# Patient Record
Sex: Male | Born: 1940 | Race: White | Hispanic: No | Marital: Married | Smoking: Never smoker
Health system: Southern US, Community
[De-identification: ages and names within clinical notes are randomized; demographics above are authoritative.]

## PROBLEM LIST (undated history)

## (undated) DIAGNOSIS — Z9889 Other specified postprocedural states: Secondary | ICD-10-CM

## (undated) DIAGNOSIS — J986 Disorders of diaphragm: Secondary | ICD-10-CM

## (undated) DIAGNOSIS — S129XXA Fracture of neck, unspecified, initial encounter: Secondary | ICD-10-CM

## (undated) DIAGNOSIS — R112 Nausea with vomiting, unspecified: Secondary | ICD-10-CM

## (undated) DIAGNOSIS — J449 Chronic obstructive pulmonary disease, unspecified: Secondary | ICD-10-CM

## (undated) DIAGNOSIS — I519 Heart disease, unspecified: Secondary | ICD-10-CM

## (undated) DIAGNOSIS — R413 Other amnesia: Secondary | ICD-10-CM

## (undated) DIAGNOSIS — D649 Anemia, unspecified: Secondary | ICD-10-CM

## (undated) DIAGNOSIS — M4802 Spinal stenosis, cervical region: Secondary | ICD-10-CM

## (undated) DIAGNOSIS — M1990 Unspecified osteoarthritis, unspecified site: Secondary | ICD-10-CM

## (undated) DIAGNOSIS — G4700 Insomnia, unspecified: Secondary | ICD-10-CM

## (undated) DIAGNOSIS — K5732 Diverticulitis of large intestine without perforation or abscess without bleeding: Secondary | ICD-10-CM

## (undated) DIAGNOSIS — I10 Essential (primary) hypertension: Secondary | ICD-10-CM

## (undated) DIAGNOSIS — Q791 Other congenital malformations of diaphragm: Secondary | ICD-10-CM

## (undated) DIAGNOSIS — G4730 Sleep apnea, unspecified: Secondary | ICD-10-CM

## (undated) DIAGNOSIS — E291 Testicular hypofunction: Secondary | ICD-10-CM

## (undated) DIAGNOSIS — E039 Hypothyroidism, unspecified: Secondary | ICD-10-CM

## (undated) DIAGNOSIS — Q249 Congenital malformation of heart, unspecified: Secondary | ICD-10-CM

## (undated) DIAGNOSIS — I5189 Other ill-defined heart diseases: Secondary | ICD-10-CM

## (undated) DIAGNOSIS — T884XXA Failed or difficult intubation, initial encounter: Secondary | ICD-10-CM

## (undated) DIAGNOSIS — N429 Disorder of prostate, unspecified: Secondary | ICD-10-CM

## (undated) HISTORY — DX: Diverticulitis of large intestine without perforation or abscess without bleeding: K57.32

## (undated) HISTORY — PX: TONSILLECTOMY: SUR1361

## (undated) HISTORY — DX: Insomnia, unspecified: G47.00

## (undated) HISTORY — PX: CARPAL TUNNEL RELEASE: SHX101

## (undated) HISTORY — DX: Fracture of neck, unspecified, initial encounter: S12.9XXA

## (undated) HISTORY — PX: OTHER SURGICAL HISTORY: SHX169

## (undated) HISTORY — DX: Testicular hypofunction: E29.1

---
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
## 1999-02-20 ENCOUNTER — Encounter: Admission: RE | Admit: 1999-02-20 | Discharge: 1999-02-20 | Payer: Self-pay | Admitting: Emergency Medicine

## 1999-02-20 ENCOUNTER — Encounter: Payer: Self-pay | Admitting: Emergency Medicine

## 2001-09-22 ENCOUNTER — Ambulatory Visit (HOSPITAL_COMMUNITY): Admission: RE | Admit: 2001-09-22 | Discharge: 2001-09-22 | Payer: Self-pay | Admitting: Orthopedic Surgery

## 2001-09-22 ENCOUNTER — Encounter: Payer: Self-pay | Admitting: Orthopedic Surgery

## 2003-04-23 ENCOUNTER — Ambulatory Visit (HOSPITAL_COMMUNITY): Admission: RE | Admit: 2003-04-23 | Discharge: 2003-04-23 | Payer: Self-pay | Admitting: Gastroenterology

## 2003-04-23 ENCOUNTER — Encounter (INDEPENDENT_AMBULATORY_CARE_PROVIDER_SITE_OTHER): Payer: Self-pay

## 2006-06-01 ENCOUNTER — Ambulatory Visit: Payer: Self-pay | Admitting: Specialist

## 2009-02-04 ENCOUNTER — Encounter: Admission: RE | Admit: 2009-02-04 | Discharge: 2009-02-04 | Payer: Self-pay | Admitting: Internal Medicine

## 2009-04-14 ENCOUNTER — Encounter: Admission: RE | Admit: 2009-04-14 | Discharge: 2009-04-14 | Payer: Self-pay | Admitting: Neurological Surgery

## 2009-08-06 ENCOUNTER — Ambulatory Visit: Payer: Self-pay | Admitting: Specialist

## 2010-04-13 ENCOUNTER — Emergency Department (HOSPITAL_COMMUNITY)
Admission: EM | Admit: 2010-04-13 | Discharge: 2010-04-13 | Payer: Self-pay | Source: Home / Self Care | Admitting: Emergency Medicine

## 2010-04-28 ENCOUNTER — Encounter
Admission: RE | Admit: 2010-04-28 | Discharge: 2010-04-28 | Payer: Self-pay | Source: Home / Self Care | Attending: Internal Medicine | Admitting: Internal Medicine

## 2010-09-11 NOTE — Op Note (Signed)
NAME:  Jason Friedman, Jason Friedman                      ACCOUNT NO.:  [id number]   MEDICAL RECORD NO.:  [id number]                   PATIENT TYPE:  AMB   LOCATION:  ENDO                                 FACILITY:  [facility]   PHYSICIAN:  James L. Malon Kindle., M.D.          DATE OF BIRTH:  29-Mar-1941   DATE OF PROCEDURE:  04/23/2003  DATE OF DISCHARGE:                                 OPERATIVE REPORT   PROCEDURE:  Colonoscopy and polypectomy.   MEDICATIONS:  Fentanyl 75 mcg, Versed 7 mg IV.   SCOPE:  Olympus pediatric adjustable colonoscope.   DESCRIPTION OF PROCEDURE:  The procedure had been explained to the patient  and consent obtained. With the patient in the left lateral decubitus  position, the Olympus pediatric adjustable scope was inserted and advanced.  The prep was excellent. We were able to reach the cecum without difficulty.  The ileocecal valve and appendiceal orifice were seen. The scope was  withdrawn and the cecum, ascending colon, and descending colon were seen  well. At 60 cm from the anal verge, a 3/4 cm sessile polyp was then removed  with the snare and sucked through the scope. No other polyps were seen until  the descending colon was reached. At that point, there was marked  diverticular disease but no polyps were seen. The scope withdrawn, the  patient tolerated the procedure well.   ASSESSMENT:  1. Descending colon polyp removed, 211.3.  2. Diverticulosis, 562.10.   PLAN:  Routine post polypectomy instructions, will recommend repeating in  three years.                                               James L. Malon Kindle., M.D.    Waldron Session  D:  04/23/2003  T:  04/23/2003  Job:  [id number]   cc:   Reinaldo Raddle. Lance Bosch, M.D.  [facility]  [address]  [place]  [place] [address]  Fax: [phone]

## 2011-03-02 HISTORY — PX: OTHER SURGICAL HISTORY: SHX169

## 2011-04-28 ENCOUNTER — Ambulatory Visit: Payer: Self-pay | Admitting: Specialist

## 2011-04-28 DIAGNOSIS — J986 Disorders of diaphragm: Secondary | ICD-10-CM | POA: Diagnosis not present

## 2011-04-28 DIAGNOSIS — Q791 Other congenital malformations of diaphragm: Secondary | ICD-10-CM | POA: Diagnosis not present

## 2011-05-14 DIAGNOSIS — M169 Osteoarthritis of hip, unspecified: Secondary | ICD-10-CM | POA: Diagnosis not present

## 2011-06-14 DIAGNOSIS — K5730 Diverticulosis of large intestine without perforation or abscess without bleeding: Secondary | ICD-10-CM | POA: Diagnosis not present

## 2011-06-14 DIAGNOSIS — Z800 Family history of malignant neoplasm of digestive organs: Secondary | ICD-10-CM | POA: Diagnosis not present

## 2011-06-14 DIAGNOSIS — Z09 Encounter for follow-up examination after completed treatment for conditions other than malignant neoplasm: Secondary | ICD-10-CM | POA: Diagnosis not present

## 2011-06-14 DIAGNOSIS — Z86010 Personal history of colonic polyps: Secondary | ICD-10-CM | POA: Diagnosis not present

## 2011-07-27 DIAGNOSIS — H2510 Age-related nuclear cataract, unspecified eye: Secondary | ICD-10-CM | POA: Diagnosis not present

## 2011-07-27 DIAGNOSIS — H1045 Other chronic allergic conjunctivitis: Secondary | ICD-10-CM | POA: Diagnosis not present

## 2011-07-27 DIAGNOSIS — H04129 Dry eye syndrome of unspecified lacrimal gland: Secondary | ICD-10-CM | POA: Diagnosis not present

## 2011-08-06 DIAGNOSIS — M169 Osteoarthritis of hip, unspecified: Secondary | ICD-10-CM | POA: Diagnosis not present

## 2011-08-09 ENCOUNTER — Encounter (HOSPITAL_COMMUNITY): Payer: Self-pay | Admitting: Pharmacy Technician

## 2011-08-12 ENCOUNTER — Ambulatory Visit (HOSPITAL_COMMUNITY)
Admission: RE | Admit: 2011-08-12 | Discharge: 2011-08-12 | Disposition: A | Discharge status: Home / Self Care | Payer: BC Managed Care – PPO | Source: Ambulatory Visit | Attending: Orthopedic Surgery | Admitting: Orthopedic Surgery

## 2011-08-12 ENCOUNTER — Encounter (HOSPITAL_COMMUNITY)
Admission: RE | Admit: 2011-08-12 | Discharge: 2011-08-12 | Disposition: A | Discharge status: Home / Self Care | Payer: BC Managed Care – PPO | Source: Ambulatory Visit | Attending: Orthopedic Surgery | Admitting: Orthopedic Surgery

## 2011-08-12 ENCOUNTER — Encounter (HOSPITAL_COMMUNITY): Payer: Self-pay

## 2011-08-12 DIAGNOSIS — M169 Osteoarthritis of hip, unspecified: Secondary | ICD-10-CM | POA: Insufficient documentation

## 2011-08-12 DIAGNOSIS — Z01810 Encounter for preprocedural cardiovascular examination: Secondary | ICD-10-CM | POA: Insufficient documentation

## 2011-08-12 DIAGNOSIS — M25859 Other specified joint disorders, unspecified hip: Secondary | ICD-10-CM | POA: Diagnosis not present

## 2011-08-12 DIAGNOSIS — Z01818 Encounter for other preprocedural examination: Secondary | ICD-10-CM | POA: Insufficient documentation

## 2011-08-12 DIAGNOSIS — Z01812 Encounter for preprocedural laboratory examination: Secondary | ICD-10-CM | POA: Insufficient documentation

## 2011-08-12 DIAGNOSIS — M1610 Unilateral primary osteoarthritis, unspecified hip: Secondary | ICD-10-CM | POA: Insufficient documentation

## 2011-08-12 DIAGNOSIS — M259 Joint disorder, unspecified: Secondary | ICD-10-CM | POA: Diagnosis not present

## 2011-08-12 HISTORY — DX: Congenital malformation of heart, unspecified: Q24.9

## 2011-08-12 HISTORY — DX: Unspecified osteoarthritis, unspecified site: M19.90

## 2011-08-12 HISTORY — DX: Essential (primary) hypertension: I10

## 2011-08-12 HISTORY — DX: Disorder of prostate, unspecified: N42.9

## 2011-08-12 HISTORY — DX: Chronic obstructive pulmonary disease, unspecified: J44.9

## 2011-08-12 HISTORY — DX: Other congenital malformations of diaphragm: Q79.1

## 2011-08-12 HISTORY — DX: Sleep apnea, unspecified: G47.30

## 2011-08-12 HISTORY — DX: Hypothyroidism, unspecified: E03.9

## 2011-08-12 HISTORY — DX: Other specified postprocedural states: Z98.890

## 2011-08-12 HISTORY — DX: Other amnesia: R41.3

## 2011-08-12 HISTORY — DX: Other specified postprocedural states: R11.2

## 2011-08-12 HISTORY — DX: Fracture of neck, unspecified, initial encounter: S12.9XXA

## 2011-08-12 LAB — COMPREHENSIVE METABOLIC PANEL
ALT: 14 U/L (ref 0–53)
AST: 19 U/L (ref 0–37)
Alkaline Phosphatase: 54 U/L (ref 39–117)
CO2: 29 mEq/L (ref 19–32)
Calcium: 9.5 mg/dL (ref 8.4–10.5)
Chloride: 102 mEq/L (ref 96–112)
GFR calc non Af Amer: 83 mL/min — ABNORMAL LOW (ref >90)
Glucose, Bld: 114 mg/dL — ABNORMAL HIGH (ref 70–99)
Potassium: 4.4 mEq/L (ref 3.5–5.1)
Sodium: 137 mEq/L (ref 135–145)
Total Bilirubin: 0.6 mg/dL (ref 0.3–1.2)

## 2011-08-12 LAB — CBC
HCT: 50.2 % (ref 39.0–52.0)
MCH: 33.3 pg (ref 26.0–34.0)
MCV: 98.4 fL (ref 78.0–100.0)
RBC: 5.1 MIL/uL (ref 4.22–5.81)
WBC: 8.3 10*3/uL (ref 4.0–10.5)

## 2011-08-12 LAB — URINALYSIS, ROUTINE W REFLEX MICROSCOPIC
Bilirubin Urine: NEGATIVE
Hgb urine dipstick: NEGATIVE
Ketones, ur: NEGATIVE mg/dL
Specific Gravity, Urine: 1.023 (ref 1.005–1.030)
Urobilinogen, UA: 0.2 mg/dL (ref 0.0–1.0)

## 2011-08-12 LAB — SURGICAL PCR SCREEN
MRSA, PCR: NEGATIVE
Staphylococcus aureus: NEGATIVE

## 2011-08-12 LAB — APTT: aPTT: 33 seconds (ref 24–37)

## 2011-08-12 MED ORDER — CHLORHEXIDINE GLUCONATE 4 % EX LIQD: 60.0000 mL | Freq: Once | CUTANEOUS | Status: DC

## 2011-08-12 NOTE — Pre-Procedure Instructions (Signed)
Spoke with dr Acey Lav, made aware pt medical hx, anesthesia will see pt day of surgery

## 2011-08-12 NOTE — Pre-Procedure Instructions (Addendum)
Last office visit note dr Meredeth Ide pulmonary Gavin Potters clicnic 04-16-2011 on chart Medical clearance note dr gates on chart1-05-29-11 chest fluro results on chart

## 2011-08-12 NOTE — Patient Instructions (Signed)
20 Jason Friedman  08/12/2011   Your procedure is scheduled on:  Monday 08-23-2011  Report to [facility] at  100 PM.  Call this number if you have problems the morning of surgery: [phone]   Remember:   Do not eat food  After Midnight. Clear liquids midnight until 0930am, then nothing by mouth  .  Take these medicines the morning of surgery with A SIP OF WATER: synthroid, liothronine, hydrocodone if needed   Do not wear jewelry or make up.  Do not wear lotions, powders, or perfumes.Do not wear deodorant.    Do not bring valuables to the hospital.  Contacts, dentures or bridgework may not be worn into surgery.  Leave suitcase in the car. After surgery it may be brought to your room.  For patients admitted to the hospital, checkout time is 11:00 AM the day of discharge.     Special Instructions: CHG Shower Use Special Wash: 1/2 bottle night before surgery and 1/2 bottle morning of surgery.neck down avoid private area   Please read over the following fact sheets that you were given: MRSA Information, blood fact sheet, incentive spirometer fact sheet   Cain Sieve [facility] pre op nurse phone number [phone], call if needed

## 2011-08-12 NOTE — Pre-Procedure Instructions (Signed)
ekg 09-02-2008 dr gates on chart due to hx of abnormal ekg

## 2011-08-22 ENCOUNTER — Other Ambulatory Visit: Payer: Self-pay | Admitting: Orthopedic Surgery

## 2011-08-22 NOTE — H&P (Signed)
Jason Friedman  DOB: [birth year] Married / Language: English / Race: White / Male  Date of Admission:  08/23/2011  Chief Complaint:  Right Hip Pain  History of Present Illness The patient is a 71 year old male who comes in for a preoperative History and Physical. The patient is scheduled for a right total hip arthroplasty to be performed by Jason Friedman. Aluisio, MD at [facility] on 08/23/2011. The patient is a 71 year old male who presents for a recheck of Hip problem. The patient was seen for a second opinion.The patient reports right hip problems including pain symptoms that have been present for 2 year(s). The symptoms began without any known injury.The symptoms are described as moderate in severity.The patient feels as if their symptoms are does feel they are worsening. Current treatment includes opioid analgesics (Hydrocodone prn). Prior to being seen today the patient was previously evaluated by a colleague 2 year(s) ago. Previous workup for this problem has included pelvic x-rays and hip x-rays. This problem has not been previously treated. Jason Friedman states that the hip does hurt him with most activities. He remains very active and said he works through the pain. he has lost a lot of motion on the hip. He saw Jason Friedman two years ago and was told he had severe arthritis and needed a hip replacement. He is here today because of pain and dysfunction have worsened. He states he would like to be able to do things that he desires and currently cannot do. Pain is in his groin radiating down his thigh to his knee. He says that his movement is more limited now than it has been in the past. His left hip does not hurt. He is not having back pain, lower extremity weakness, or paresthesias. They have been treated conservatively in the past for the above stated problem and despite conservative measures, they continue to have progressive pain and severe functional limitations and  dysfunction. They have failed non-operative management. It is felt that they would benefit from undergoing total joint replacement. Risks and benefits of the procedure have been discussed with the patient and they elect to proceed with surgery. There are no active contraindications to surgery such as ongoing infection or rapidly progressive neurological disease.  Allergies Sulfa Drugs Shellfish Gabapentin *ANTICONVULSANTS*. Visual Disturbances, Hair Loss  Medication History Ambien CR (12.5MG  Tablet ER, Oral) Active. Synthroid ( Tablet, Oral) Active. Eye Allergy Relief (0.025-0.3% Solution, Ophthalmic) Active. Liothyronine Sodium ( Tablet, Oral) Active. Vicodin (5-500MG  Tablet, Oral) Active. Valium (10MG  Tablet, Oral) Active. Aspirin Childrens (81MG  Tablet Chewable, Oral) Active. AndroGel Pump (1% Gel, Transdermal) Active.  Problem List/Past Medical Osteoarthritis, Hip (715.35) Prostatitis (601.9) Sigmoid diverticulosis (562.10) Renal calculus (592.0) Hypertension Hypothyroidism Hypogonadism Insomnia Allergic Rhinitis Sleep Apnea Colonic Polyps Hyperlipidemia Impaired Memory Right-sided partial paralyzed diaphram  Past Surgical History Tonsillectomy Arthroscopy of Shoulder. right Colon Polyp Removal - Colonoscopy  Family History Cancer. mother and father Hypertension. sister and brother  Social History Number of flights of stairs before winded. greater than 5 Pain Contract. no Marital status. married Illicit drug use. no Living situation. live with spouse Tobacco use. never smoker Exercise. Exercises daily; does running / walking and gym / weights Drug/Alcohol Rehab (Currently). no Drug/Alcohol Rehab (Previously). no Current work status. retired Alcohol use. current drinker; drinks beer; 8-14 per week Children. 4  Review of Systems General:Present- Memory Loss (slight , rare). Not Present- Chills, Fever, Night Sweats, Fatigue,  Weight Gain and Weight Loss. Skin:Not  Present- Hives, Itching, Rash, Eczema and Lesions. HEENT:Not Present- Tinnitus, Headache, Double Vision, Visual Loss, Hearing Loss and Dentures. Respiratory:Not Present- Shortness of breath with exertion, Shortness of breath at rest, Allergies, Coughing up blood and Chronic Cough. Cardiovascular:Not Present- Chest Pain, Racing/skipping heartbeats, Difficulty Breathing Lying Down, Murmur, Swelling and Palpitations. Gastrointestinal:Not Present- Bloody Stool, Heartburn, Abdominal Pain, Vomiting, Nausea, Constipation, Diarrhea, Difficulty Swallowing, Jaundice and Loss of appetitie. Male Genitourinary:Not Present- Urinary frequency, Blood in Urine, Weak urinary stream, Discharge, Flank Pain, Incontinence, Painful Urination, Urgency, Urinary Retention and Urinating at Night. Musculoskeletal:Present- Muscle Weakness, Muscle Pain, Joint Swelling, Joint Pain, Back Pain, Morning Stiffness and Spasms. Neurological:Not Present- Tremor, Dizziness, Blackout spells, Paralysis, Difficulty with balance and Weakness. Psychiatric:Not Present- Insomnia.  Vitals Weight: 69 lb Height: 186 in Body Surface Area: 2.03 m Body Mass Index: 1.4 kg/m Pulse: 68 (Regular) Resp.: 14 (Unlabored) BP: 138/82 (Sitting, Right Arm, Standard)  Physical Exam The physical exam findings are as follows:   General Mental Status - Alert, cooperative and good historian. General Appearance- pleasant. Not in acute distress. Orientation- Oriented X3. Build & Nutrition- Well nourished and Well developed.   Head and Neck Head- normocephalic, atraumatic . Neck Global Assessment- supple. no bruit auscultated on the right and no bruit auscultated on the left.   Eye Pupil- Bilateral- Regular and Round. Motion- Bilateral- EOMI.   Chest and Lung Exam Auscultation: Breath sounds:- clear at anterior chest wall and - clear at posterior chest  wall. Adventitious sounds:- No Adventitious sounds.   Cardiovascular Auscultation:Rhythm- Regular rate and rhythm. Heart Sounds- S1 WNL and S2 WNL. Murmurs & Other Heart Sounds:Auscultation of the heart reveals - No Murmurs.   Abdomen Palpation/Percussion:Tenderness- Abdomen is non-tender to palpation. Rigidity (guarding)- Abdomen is soft. Auscultation:Auscultation of the abdomen reveals - Bowel sounds normal.   Male Genitourinary  Not done, not pertinent to present illness  Musculoskeletal On exam a well developed male, alert, and oriented in no apparent distress. He walks with an antalgic gait pattern. His left hip has normal range of motion with no discomfort. His right hip can be flexed to 95, no internal rotation, about 10 external rotation, 20 abduction. His knee exam is normal bilaterally. Pulses sensation and motor intact both lower extremities. Gait pattern is significantly antalgic on the right.  RADIOGRAPHS: AP pelvis and lateral of the right hip shows he has severe endstage arthritis to the right hip, bone on bone with large osteophyte formation and subchondral sclerosis.  Assessment & Plan Osteoarthritis Right Hip  Plan is for a Right Total Hip Arthroplasty by Dr. Lequita Halt.  Plan is to go home after the hospital stay.  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 He had a chest fluoro performed at [facility] back on Apr 28, 2011 which showed findings concerning for at least partially paralyzed right hemidiaphragm.  PCP - Dr. Kevan Ny - Patient has been seen preoperatively by Dr. Kevan Ny and felt to stable for upcoming surgery.  Avel Peace, PA-C

## 2011-08-23 ENCOUNTER — Inpatient Hospital Stay (HOSPITAL_COMMUNITY): Payer: BC Managed Care – PPO

## 2011-08-23 ENCOUNTER — Ambulatory Visit (HOSPITAL_COMMUNITY): Payer: BC Managed Care – PPO | Admitting: Anesthesiology

## 2011-08-23 ENCOUNTER — Encounter (HOSPITAL_COMMUNITY): Payer: Self-pay | Admitting: Anesthesiology

## 2011-08-23 ENCOUNTER — Encounter (HOSPITAL_COMMUNITY)
Admission: RE | Disposition: A | Discharge status: Home Health | Payer: Self-pay | Source: Ambulatory Visit | Attending: Orthopedic Surgery

## 2011-08-23 ENCOUNTER — Encounter (HOSPITAL_COMMUNITY): Payer: Self-pay | Admitting: *Deleted

## 2011-08-23 ENCOUNTER — Inpatient Hospital Stay (HOSPITAL_COMMUNITY)
Admission: RE | Admit: 2011-08-23 | Discharge: 2011-08-25 | DRG: 818 | Disposition: A | Discharge status: Home Health | Payer: BC Managed Care – PPO | Source: Ambulatory Visit | Attending: Orthopedic Surgery | Admitting: Orthopedic Surgery

## 2011-08-23 DIAGNOSIS — I10 Essential (primary) hypertension: Secondary | ICD-10-CM | POA: Diagnosis present

## 2011-08-23 DIAGNOSIS — M1610 Unilateral primary osteoarthritis, unspecified hip: Principal | ICD-10-CM | POA: Diagnosis present

## 2011-08-23 DIAGNOSIS — M169 Osteoarthritis of hip, unspecified: Secondary | ICD-10-CM | POA: Diagnosis present

## 2011-08-23 DIAGNOSIS — M25559 Pain in unspecified hip: Secondary | ICD-10-CM | POA: Diagnosis not present

## 2011-08-23 DIAGNOSIS — E039 Hypothyroidism, unspecified: Secondary | ICD-10-CM | POA: Diagnosis present

## 2011-08-23 DIAGNOSIS — J4489 Other specified chronic obstructive pulmonary disease: Secondary | ICD-10-CM | POA: Diagnosis present

## 2011-08-23 DIAGNOSIS — Z96649 Presence of unspecified artificial hip joint: Secondary | ICD-10-CM

## 2011-08-23 DIAGNOSIS — J449 Chronic obstructive pulmonary disease, unspecified: Secondary | ICD-10-CM | POA: Diagnosis present

## 2011-08-23 HISTORY — PX: TOTAL HIP ARTHROPLASTY: SHX124

## 2011-08-23 HISTORY — DX: Anemia, unspecified: D64.9

## 2011-08-23 LAB — TYPE AND SCREEN: Antibody Screen: NEGATIVE

## 2011-08-23 SURGERY — ARTHROPLASTY, HIP, TOTAL,POSTERIOR APPROACH
Anesthesia: General | Site: Hip | Laterality: Right | Wound class: Clean

## 2011-08-23 MED ORDER — LIDOCAINE HCL (CARDIAC) 20 MG/ML IV SOLN
INTRAVENOUS | Status: DC | PRN
  Administered 2011-08-23: 50 mg via INTRAVENOUS

## 2011-08-23 MED ORDER — METHOCARBAMOL 100 MG/ML IJ SOLN
500.0000 mg | Freq: Four times a day (QID) | INTRAVENOUS | Status: DC | PRN
  Administered 2011-08-23: 500 mg via INTRAVENOUS
  Filled 2011-08-23: qty 5

## 2011-08-23 MED ORDER — BISACODYL 10 MG RE SUPP: 10.0000 mg | Freq: Every day | RECTAL | Status: DC | PRN

## 2011-08-23 MED ORDER — NEOSTIGMINE METHYLSULFATE 1 MG/ML IJ SOLN
INTRAMUSCULAR | Status: DC | PRN
  Administered 2011-08-23: 3.5 mg via INTRAVENOUS

## 2011-08-23 MED ORDER — FENTANYL CITRATE 0.05 MG/ML IJ SOLN
INTRAMUSCULAR | Status: DC | PRN
  Administered 2011-08-23 (×2): 50 ug via INTRAVENOUS
  Administered 2011-08-23: 150 ug via INTRAVENOUS
  Administered 2011-08-23: 100 ug via INTRAVENOUS

## 2011-08-23 MED ORDER — CEFAZOLIN SODIUM-DEXTROSE 2-3 GM-% IV SOLR: 2.0000 g | INTRAVENOUS | Status: DC

## 2011-08-23 MED ORDER — OXYCODONE HCL 5 MG PO TABS
5.0000 mg | ORAL_TABLET | ORAL | Status: DC | PRN
  Administered 2011-08-23 – 2011-08-25 (×10): 10 mg via ORAL
  Filled 2011-08-23 (×10): qty 2

## 2011-08-23 MED ORDER — BUPIVACAINE LIPOSOME 1.3 % IJ SUSP
INTRAMUSCULAR | Status: DC | PRN
  Administered 2011-08-23: 20 mL

## 2011-08-23 MED ORDER — BUPIVACAINE LIPOSOME 1.3 % IJ SUSP
20.0000 mL | Freq: Once | INTRAMUSCULAR | Status: DC
  Filled 2011-08-23: qty 20

## 2011-08-23 MED ORDER — DROPERIDOL 2.5 MG/ML IJ SOLN
INTRAMUSCULAR | Status: DC | PRN
  Administered 2011-08-23: 0.625 mg via INTRAVENOUS

## 2011-08-23 MED ORDER — SODIUM CHLORIDE 0.9 % IJ SOLN
INTRAMUSCULAR | Status: DC | PRN
  Administered 2011-08-23: 50 mL

## 2011-08-23 MED ORDER — METOCLOPRAMIDE HCL 5 MG/ML IJ SOLN: 5.0000 mg | Freq: Three times a day (TID) | INTRAMUSCULAR | Status: DC | PRN

## 2011-08-23 MED ORDER — ACETAMINOPHEN 325 MG PO TABS: 650.0000 mg | ORAL_TABLET | Freq: Four times a day (QID) | ORAL | Status: DC | PRN

## 2011-08-23 MED ORDER — LEVOTHYROXINE SODIUM 125 MCG PO TABS
125.0000 ug | ORAL_TABLET | Freq: Every day | ORAL | Status: DC
  Administered 2011-08-24 – 2011-08-25 (×2): 125 ug via ORAL
  Filled 2011-08-23 (×3): qty 1

## 2011-08-23 MED ORDER — LACTATED RINGERS IV SOLN
INTRAVENOUS | Status: DC
  Administered 2011-08-23: 16:00:00 via INTRAVENOUS
  Administered 2011-08-23: 1000 mL via INTRAVENOUS
  Administered 2011-08-23: 16:00:00 via INTRAVENOUS

## 2011-08-23 MED ORDER — METOCLOPRAMIDE HCL 10 MG PO TABS: 5.0000 mg | ORAL_TABLET | Freq: Three times a day (TID) | ORAL | Status: DC | PRN

## 2011-08-23 MED ORDER — TRAMADOL HCL 50 MG PO TABS: 50.0000 mg | ORAL_TABLET | Freq: Four times a day (QID) | ORAL | Status: DC | PRN

## 2011-08-23 MED ORDER — MIDAZOLAM HCL 5 MG/5ML IJ SOLN
INTRAMUSCULAR | Status: DC | PRN
  Administered 2011-08-23: 2 mg via INTRAVENOUS

## 2011-08-23 MED ORDER — METHOCARBAMOL 500 MG PO TABS
500.0000 mg | ORAL_TABLET | Freq: Four times a day (QID) | ORAL | Status: DC | PRN
Start: 2011-08-23 — End: 2011-08-25
  Administered 2011-08-24 – 2011-08-25 (×3): 500 mg via ORAL
  Filled 2011-08-23 (×3): qty 1

## 2011-08-23 MED ORDER — ACETAMINOPHEN 10 MG/ML IV SOLN
1000.0000 mg | Freq: Four times a day (QID) | INTRAVENOUS | Status: AC
  Administered 2011-08-23 – 2011-08-24 (×4): 1000 mg via INTRAVENOUS
  Filled 2011-08-23 (×8): qty 100

## 2011-08-23 MED ORDER — HYDROMORPHONE HCL PF 1 MG/ML IJ SOLN
0.2500 mg | INTRAMUSCULAR | Status: DC | PRN
  Administered 2011-08-23 (×2): 0.25 mg via INTRAVENOUS
  Administered 2011-08-23: 0.5 mg via INTRAVENOUS

## 2011-08-23 MED ORDER — KETOROLAC TROMETHAMINE 30 MG/ML IJ SOLN
15.0000 mg | Freq: Once | INTRAMUSCULAR | Status: AC | PRN
  Administered 2011-08-23: 30 mg via INTRAVENOUS

## 2011-08-23 MED ORDER — VANCOMYCIN HCL 1000 MG IV SOLR
1000.0000 mg | INTRAVENOUS | Status: DC | PRN
  Administered 2011-08-23: 1000 mg via INTRAVENOUS

## 2011-08-23 MED ORDER — FLEET ENEMA 7-19 GM/118ML RE ENEM: 1.0000 | ENEMA | Freq: Once | RECTAL | Status: AC | PRN

## 2011-08-23 MED ORDER — SODIUM CHLORIDE 0.9 % IV SOLN
INTRAVENOUS | Status: DC
  Administered 2011-08-23: 100 mL/h via INTRAVENOUS
  Administered 2011-08-24: 07:00:00 via INTRAVENOUS
  Administered 2011-08-24: 20 mL/h via INTRAVENOUS

## 2011-08-23 MED ORDER — 0.9 % SODIUM CHLORIDE (POUR BTL) OPTIME
TOPICAL | Status: DC | PRN
  Administered 2011-08-23: 1000 mL

## 2011-08-23 MED ORDER — VANCOMYCIN HCL IN DEXTROSE 1-5 GM/200ML-% IV SOLN
INTRAVENOUS | Status: AC
  Filled 2011-08-23: qty 200

## 2011-08-23 MED ORDER — DOCUSATE SODIUM 100 MG PO CAPS
100.0000 mg | ORAL_CAPSULE | Freq: Two times a day (BID) | ORAL | Status: DC
  Administered 2011-08-23 – 2011-08-25 (×4): 100 mg via ORAL
  Filled 2011-08-23 (×5): qty 1

## 2011-08-23 MED ORDER — PROPOFOL 10 MG/ML IV BOLUS
INTRAVENOUS | Status: DC | PRN
  Administered 2011-08-23: 200 mg via INTRAVENOUS

## 2011-08-23 MED ORDER — ACETAMINOPHEN 10 MG/ML IV SOLN
1000.0000 mg | Freq: Four times a day (QID) | INTRAVENOUS | Status: DC
  Administered 2011-08-23: 1000 mg via INTRAVENOUS
  Filled 2011-08-23: qty 100

## 2011-08-23 MED ORDER — KETOROLAC TROMETHAMINE 30 MG/ML IJ SOLN
INTRAMUSCULAR | Status: AC
  Filled 2011-08-23: qty 1

## 2011-08-23 MED ORDER — SCOPOLAMINE 1 MG/3DAYS TD PT72
MEDICATED_PATCH | TRANSDERMAL | Status: AC
  Filled 2011-08-23: qty 1

## 2011-08-23 MED ORDER — ONDANSETRON HCL 4 MG/2ML IJ SOLN: 4.0000 mg | Freq: Four times a day (QID) | INTRAMUSCULAR | Status: DC | PRN

## 2011-08-23 MED ORDER — RIVAROXABAN 10 MG PO TABS
10.0000 mg | ORAL_TABLET | Freq: Every day | ORAL | Status: DC
  Administered 2011-08-24 – 2011-08-25 (×2): 10 mg via ORAL
  Filled 2011-08-23 (×3): qty 1

## 2011-08-23 MED ORDER — ACETAMINOPHEN 650 MG RE SUPP: 650.0000 mg | Freq: Four times a day (QID) | RECTAL | Status: DC | PRN

## 2011-08-23 MED ORDER — VANCOMYCIN HCL IN DEXTROSE 1-5 GM/200ML-% IV SOLN
1000.0000 mg | Freq: Two times a day (BID) | INTRAVENOUS | Status: AC
  Administered 2011-08-24: 1000 mg via INTRAVENOUS
  Filled 2011-08-23: qty 200

## 2011-08-23 MED ORDER — ONDANSETRON HCL 4 MG PO TABS: 4.0000 mg | ORAL_TABLET | Freq: Four times a day (QID) | ORAL | Status: DC | PRN

## 2011-08-23 MED ORDER — PHENOL 1.4 % MT LIQD: 1.0000 | OROMUCOSAL | Status: DC | PRN

## 2011-08-23 MED ORDER — MORPHINE SULFATE 2 MG/ML IJ SOLN: 1.0000 mg | INTRAMUSCULAR | Status: DC | PRN

## 2011-08-23 MED ORDER — CEFAZOLIN SODIUM-DEXTROSE 2-3 GM-% IV SOLR
INTRAVENOUS | Status: AC
  Filled 2011-08-23: qty 50

## 2011-08-23 MED ORDER — MENTHOL 3 MG MT LOZG: 1.0000 | LOZENGE | OROMUCOSAL | Status: DC | PRN

## 2011-08-23 MED ORDER — GLYCOPYRROLATE 0.2 MG/ML IJ SOLN
INTRAMUSCULAR | Status: DC | PRN
  Administered 2011-08-23: 0.4 mg via INTRAVENOUS

## 2011-08-23 MED ORDER — HYDROMORPHONE HCL PF 1 MG/ML IJ SOLN
INTRAMUSCULAR | Status: AC
  Filled 2011-08-23: qty 1

## 2011-08-23 MED ORDER — POLYETHYLENE GLYCOL 3350 17 G PO PACK
17.0000 g | PACK | Freq: Every day | ORAL | Status: DC | PRN
  Filled 2011-08-23: qty 1

## 2011-08-23 MED ORDER — PROMETHAZINE HCL 25 MG/ML IJ SOLN: 6.2500 mg | INTRAMUSCULAR | Status: DC | PRN

## 2011-08-23 MED ORDER — ROCURONIUM BROMIDE 100 MG/10ML IV SOLN
INTRAVENOUS | Status: DC | PRN
  Administered 2011-08-23: 50 mg via INTRAVENOUS

## 2011-08-23 MED ORDER — ONDANSETRON HCL 4 MG/2ML IJ SOLN
INTRAMUSCULAR | Status: DC | PRN
  Administered 2011-08-23: 4 mg via INTRAVENOUS

## 2011-08-23 MED ORDER — LIOTHYRONINE SODIUM 25 MCG PO TABS
25.0000 ug | ORAL_TABLET | Freq: Every day | ORAL | Status: DC
  Administered 2011-08-24 – 2011-08-25 (×2): 25 ug via ORAL
  Filled 2011-08-23 (×3): qty 1

## 2011-08-23 MED ORDER — ZOLPIDEM TARTRATE 10 MG PO TABS: 10.0000 mg | ORAL_TABLET | Freq: Every evening | ORAL | Status: DC | PRN

## 2011-08-23 MED ORDER — DEXAMETHASONE SODIUM PHOSPHATE 10 MG/ML IJ SOLN
10.0000 mg | Freq: Once | INTRAMUSCULAR | Status: AC
  Administered 2011-08-23: 10 mg via INTRAVENOUS

## 2011-08-23 MED ORDER — ACETAMINOPHEN 10 MG/ML IV SOLN
INTRAVENOUS | Status: AC
  Filled 2011-08-23: qty 100

## 2011-08-23 MED ORDER — DIPHENHYDRAMINE HCL 12.5 MG/5ML PO ELIX: 12.5000 mg | ORAL_SOLUTION | ORAL | Status: DC | PRN

## 2011-08-23 MED ORDER — SCOPOLAMINE 1 MG/3DAYS TD PT72
MEDICATED_PATCH | TRANSDERMAL | Status: DC | PRN
  Administered 2011-08-23: 1.5 mg via TRANSDERMAL

## 2011-08-23 SURGICAL SUPPLY — 52 items
BAG ZIPLOCK 12X15 (MISCELLANEOUS) ×2 IMPLANT
BIT DRILL 2.8X128 (BIT) ×2 IMPLANT
BLADE EXTENDED COATED 6.5IN (ELECTRODE) ×4 IMPLANT
BLADE SAW SAG 73X25 THK (BLADE) ×1
BLADE SAW SGTL 73X25 THK (BLADE) ×1 IMPLANT
CLOTH BEACON ORANGE TIMEOUT ST (SAFETY) ×2 IMPLANT
DECANTER SPIKE VIAL GLASS SM (MISCELLANEOUS) ×2 IMPLANT
DRAPE INCISE IOBAN 66X45 STRL (DRAPES) ×2 IMPLANT
DRAPE ORTHO SPLIT 77X108 STRL (DRAPES) ×2
DRAPE POUCH INSTRU U-SHP 10X18 (DRAPES) ×2 IMPLANT
DRAPE SURG ORHT 6 SPLT 77X108 (DRAPES) ×2 IMPLANT
DRAPE U-SHAPE 47X51 STRL (DRAPES) ×2 IMPLANT
DRSG ADAPTIC 3X8 NADH LF (GAUZE/BANDAGES/DRESSINGS) ×2 IMPLANT
DRSG MEPILEX BORDER 4X4 (GAUZE/BANDAGES/DRESSINGS) ×2 IMPLANT
DRSG MEPILEX BORDER 4X8 (GAUZE/BANDAGES/DRESSINGS) ×2 IMPLANT
DURAPREP 26ML APPLICATOR (WOUND CARE) ×2 IMPLANT
ELECT REM PT RETURN 9FT ADLT (ELECTROSURGICAL) ×2
ELECTRODE REM PT RTRN 9FT ADLT (ELECTROSURGICAL) ×1 IMPLANT
EVACUATOR 1/8 PVC DRAIN (DRAIN) ×2 IMPLANT
FACESHIELD LNG OPTICON STERILE (SAFETY) ×8 IMPLANT
GLOVE BIO SURGEON STRL SZ7.5 (GLOVE) ×2 IMPLANT
GLOVE BIO SURGEON STRL SZ8 (GLOVE) ×2 IMPLANT
GLOVE BIOGEL PI IND STRL 8 (GLOVE) ×2 IMPLANT
GLOVE BIOGEL PI INDICATOR 8 (GLOVE) ×2
GOWN STRL NON-REIN LRG LVL3 (GOWN DISPOSABLE) ×2 IMPLANT
GOWN STRL REIN XL XLG (GOWN DISPOSABLE) ×2 IMPLANT
IMMOBILIZER KNEE 20 (SOFTGOODS) ×2
IMMOBILIZER KNEE 20 THIGH 36 (SOFTGOODS) ×1 IMPLANT
KIT BASIN OR (CUSTOM PROCEDURE TRAY) ×2 IMPLANT
MANIFOLD NEPTUNE II (INSTRUMENTS) ×2 IMPLANT
NDL SAFETY ECLIPSE 18X1.5 (NEEDLE) ×1 IMPLANT
NEEDLE HYPO 18GX1.5 SHARP (NEEDLE) ×1
NS IRRIG 1000ML POUR BTL (IV SOLUTION) ×2 IMPLANT
PACK TOTAL JOINT (CUSTOM PROCEDURE TRAY) ×2 IMPLANT
PASSER SUT SWANSON 36MM LOOP (INSTRUMENTS) ×2 IMPLANT
PENCIL BUTTON HOLSTER BLD 10FT (ELECTRODE) ×2 IMPLANT
POSITIONER SURGICAL ARM (MISCELLANEOUS) ×2 IMPLANT
SPONGE GAUZE 4X4 12PLY (GAUZE/BANDAGES/DRESSINGS) ×2 IMPLANT
SPONGE LAP 18X18 X RAY DECT (DISPOSABLE) ×2 IMPLANT
STRIP CLOSURE SKIN 1/2X4 (GAUZE/BANDAGES/DRESSINGS) ×2 IMPLANT
SUT ETHIBOND NAB CT1 #1 30IN (SUTURE) ×4 IMPLANT
SUT MNCRL AB 4-0 PS2 18 (SUTURE) ×2 IMPLANT
SUT VIC AB 1 CT1 27 (SUTURE) ×2
SUT VIC AB 1 CT1 27XBRD ANTBC (SUTURE) ×2 IMPLANT
SUT VIC AB 2-0 CT1 27 (SUTURE) ×3
SUT VIC AB 2-0 CT1 TAPERPNT 27 (SUTURE) ×3 IMPLANT
SUT VLOC 180 0 24IN GS25 (SUTURE) ×4 IMPLANT
SYR 50ML LL SCALE MARK (SYRINGE) ×2 IMPLANT
TOWEL OR 17X26 10 PK STRL BLUE (TOWEL DISPOSABLE) ×4 IMPLANT
TOWEL OR NON WOVEN STRL DISP B (DISPOSABLE) ×2 IMPLANT
TRAY FOLEY CATH 14FRSI W/METER (CATHETERS) ×2 IMPLANT
WATER STERILE IRR 1500ML POUR (IV SOLUTION) ×2 IMPLANT

## 2011-08-23 NOTE — Interval H&P Note (Signed)
History and Physical Interval Note:  08/23/2011 2:59 PM  Jason Friedman  has presented today for surgery, with the diagnosis of osteoarthritis right hip  The various methods of treatment have been discussed with the patient and family. After consideration of risks, benefits and other options for treatment, the patient has consented to  Procedure(s) (LRB): TOTAL HIP ARTHROPLASTY (Right) as a surgical intervention .  The patients' history has been reviewed, patient examined, no change in status, stable for surgery.  I have reviewed the patients' chart and labs.  Questions were answered to the patient's satisfaction.     Loanne Drilling

## 2011-08-23 NOTE — Op Note (Signed)
Pre-operative diagnosis- Osteoarthritis Right hip  Post-operative diagnosis- Osteoarthritis  Right hip  Procedure-  RightTotal Hip Arthroplasty  Surgeon- Gus Rankin. Ellie Spickler, MD  Assistant- Avel Peace, PA-C   Anesthesia  General  EBL- 400   Drain Hemovac   Complication- None  Condition-PACU - hemodynamically stable.   Brief Clinical Note- Jason Friedman is a 71 y.o. male with end stage arthritis of his right hip with progressively worsening pain and dysfunction. Pain occurs with activity and rest including pain at night. He has tried analgesics, protected weight bearing and rest without benefit. Pain is too severe to attempt physical therapy. Radiographs demonstrate bone on bone arthritis with subchondral cyst formation. He presents now for right THA.  Procedure in detail-   The patient is brought into the operating room and placed on the operating table. After successful administration of General  anesthesia, the patient is placed in the  Left lateral decubitus position with the  Right side up and held in place with the hip positioner. The lower extremity is isolated from the perineum with plastic drapes and time-out is performed by the surgical team. The lower extremity is then prepped and draped in the usual sterile fashion. A short posterolateral incision is made with a ten blade through the subcutaneous tissue to the level of the fascia lata which is incised in line with the skin incision. The sciatic nerve is palpated and protected and the short external rotators and capsule are isolated from the femur. The hip is then dislocated and the center of the femoral head is marked. A trial prosthesis is placed such that the trial head corresponds to the center of the patients' native femoral head. The resection level is marked on the femoral neck and the resection is made with an oscillating saw. The femoral head is removed and femoral retractors placed to gain access to the femoral canal.     The canal finder is passed into the femoral canal and the canal is thoroughly irrigated with sterile saline to remove the fatty contents. Axial reaming is performed to 15.5  mm, proximal reaming to 46F  and the sleeve machined to a large. A 46F large trial sleeve is placed into the proximal femur.      The femur is then retracted anteriorly to gain acetabular exposure. Acetabular retractors are placed and the labrum and osteophytes are removed, Acetabular reaming is performed to 55  mm and a 56  mm Pinnacle acetabular shell is placed in anatomic position with excellent purchase. Additional dome screws were not needed. An apex hole eliminator is placed and the permanent 36 mm neutral + 4 Marathon liner is placed into the acetabular shell.      The trial femur is then placed into the femoral canal. The size is 20 x 15  stem with a 36 + 8   neck and a 36 + 0 head with the neck version matching  the patients' native anteversion. The hip is reduced with excellent stability with full extension and full external rotation, 70 degrees flexion with 40 degrees adduction and 90 degrees internal rotation and 90 degrees of flexion with 70 degrees of internal rotation. The operative leg is placed on top of the non-operative leg and the leg lengths are found to be equal. The trials are then removed and the permanent implant of the same size is impacted into the femoral canal. The ceramic femoral head of the same size as the trial is placed and the hip is reduced  with the same stability parameters. The operative leg is again placed on top of the non-operative leg and the leg lengths are found to be equal.      The wound is then copiously irrigated with saline solution and the capsule and short external rotators are re-attached to the femur through drill holes with Ethibond suture. The fascia lata is closed over a hemovac drain with #1 vicryl suture and the fascia lata, gluteal muscles and subcutaneous tissues are injected with  Exparel 20ml diluted with saline 50ml. The subcutaneous tissues are closed with #1 and2-0 vicryl and the subcuticular layer closed with running 4-0 Monocryl. The drain is hooked to suction, incision cleaned and dried, and steri-srips and a bulky sterile dressing applied. The limb is placed into a knee immobilizer and the patient is awakened and transported to recovery in stable condition.      Please note that a surgical assistant was a medical necessity for this procedure in order to perform it in a safe and expeditious manner. The assistant was necessary to provide retraction to the vital neurovascular structures and to retract and position the limb to allow for anatomic placement of the prosthetic components.  [name], MD    08/23/2011, 4:23 PM

## 2011-08-23 NOTE — Plan of Care (Signed)
Problem: Consults Goal: Diagnosis- Total Joint Replacement Primary Total Hip     

## 2011-08-23 NOTE — Anesthesia Preprocedure Evaluation (Signed)
Anesthesia Evaluation  Patient identified by MRN, date of birth, ID band Patient awake    Reviewed: Allergy & Precautions, H&P , NPO status , Patient's Chart, lab work & pertinent test results  History of Anesthesia Complications (+) PONV  Airway Mallampati: II TM Distance: >3 FB Neck ROM: Full    Dental No notable dental hx.    Pulmonary COPD breath sounds clear to auscultation  Pulmonary exam normal       Cardiovascular hypertension, Rhythm:Regular Rate:Normal     Neuro/Psych negative neurological ROS  negative psych ROS   GI/Hepatic negative GI ROS, Neg liver ROS,   Endo/Other  Hypothyroidism   Renal/GU negative Renal ROS  negative genitourinary   Musculoskeletal negative musculoskeletal ROS (+)   Abdominal   Peds negative pediatric ROS (+)  Hematology negative hematology ROS (+)   Anesthesia Other Findings   Reproductive/Obstetrics negative OB ROS                           Anesthesia Physical Anesthesia Plan  ASA: III  Anesthesia Plan: General   Post-op Pain Management:    Induction: Intravenous  Airway Management Planned: Oral ETT  Additional Equipment:   Intra-op Plan:   Post-operative Plan: Extubation in OR  Informed Consent: I have reviewed the patients History and Physical, chart, labs and discussed the procedure including the risks, benefits and alternatives for the proposed anesthesia with the patient or authorized representative who has indicated his/her understanding and acceptance.   Dental advisory given  Plan Discussed with: CRNA  Anesthesia Plan Comments:         Anesthesia Quick Evaluation

## 2011-08-23 NOTE — H&P (View-Only) (Signed)
Jason Friedman  DOB: 07/30/1940 Married / Language: English / Race: White / Male  Date of Admission:  08/23/2011  Chief Complaint:  Right Hip Pain  History of Present Illness The patient is a 71 year old male who comes in for a preoperative History and Physical. The patient is scheduled for a right total hip arthroplasty to be performed by Dr. Frank V. Aluisio, MD at [facility] on 08/23/2011. The patient is a 71 year old male who presents for a recheck of Hip problem. The patient was seen for a second opinion.The patient reports right hip problems including pain symptoms that have been present for 2 year(s). The symptoms began without any known injury.The symptoms are described as moderate in severity.The patient feels as if their symptoms are does feel they are worsening. Current treatment includes opioid analgesics (Hydrocodone prn). Prior to being seen today the patient was previously evaluated by a colleague 2 year(s) ago. Previous workup for this problem has included pelvic x-rays and hip x-rays. This problem has not been previously treated. Jason Friedman states that the hip does hurt him with most activities. He remains very active and said he works through the pain. he has lost a lot of motion on the hip. He saw Dr. Rendall two years ago and was told he had severe arthritis and needed a hip replacement. He is here today because of pain and dysfunction have worsened. He states he would like to be able to do things that he desires and currently cannot do. Pain is in his groin radiating down his thigh to his knee. He says that his movement is more limited now than it has been in the past. His left hip does not hurt. He is not having back pain, lower extremity weakness, or paresthesias. They have been treated conservatively in the past for the above stated problem and despite conservative measures, they continue to have progressive pain and severe functional limitations and  dysfunction. They have failed non-operative management. It is felt that they would benefit from undergoing total joint replacement. Risks and benefits of the procedure have been discussed with the patient and they elect to proceed with surgery. There are no active contraindications to surgery such as ongoing infection or rapidly progressive neurological disease.  Allergies Sulfa Drugs Shellfish Gabapentin *ANTICONVULSANTS*. Visual Disturbances, Hair Loss  Medication History Ambien CR (12.5MG Tablet ER, Oral) Active. Synthroid (125MCG Tablet, Oral) Active. Eye Allergy Relief (0.025-0.3% Solution, Ophthalmic) Active. Liothyronine Sodium (25MCG Tablet, Oral) Active. Vicodin (5-500MG Tablet, Oral) Active. Valium (10MG Tablet, Oral) Active. Aspirin Childrens (81MG Tablet Chewable, Oral) Active. AndroGel Pump (1% Gel, Transdermal) Active.  Problem List/Past Medical Osteoarthritis, Hip (715.35) Prostatitis (601.9) Sigmoid diverticulosis (562.10) Renal calculus (592.0) Hypertension Hypothyroidism Hypogonadism Insomnia Allergic Rhinitis Sleep Apnea Colonic Polyps Hyperlipidemia Impaired Memory Right-sided partial paralyzed diaphram  Past Surgical History Tonsillectomy Arthroscopy of Shoulder. right Colon Polyp Removal - Colonoscopy  Family History Cancer. mother and father Hypertension. sister and brother  Social History Number of flights of stairs before winded. greater than 5 Pain Contract. no Marital status. married Illicit drug use. no Living situation. live with spouse Tobacco use. never smoker Exercise. Exercises daily; does running / walking and gym / weights Drug/Alcohol Rehab (Currently). no Drug/Alcohol Rehab (Previously). no Current work status. retired Alcohol use. current drinker; drinks beer; 8-14 per week Children. 4  Review of Systems General:Present- Memory Loss (slight , rare). Not Present- Chills, Fever, Night Sweats, Fatigue,  Weight Gain and Weight Loss. Skin:Not   Present- Hives, Itching, Rash, Eczema and Lesions. HEENT:Not Present- Tinnitus, Headache, Double Vision, Visual Loss, Hearing Loss and Dentures. Respiratory:Not Present- Shortness of breath with exertion, Shortness of breath at rest, Allergies, Coughing up blood and Chronic Cough. Cardiovascular:Not Present- Chest Pain, Racing/skipping heartbeats, Difficulty Breathing Lying Down, Murmur, Swelling and Palpitations. Gastrointestinal:Not Present- Bloody Stool, Heartburn, Abdominal Pain, Vomiting, Nausea, Constipation, Diarrhea, Difficulty Swallowing, Jaundice and Loss of appetitie. Male Genitourinary:Not Present- Urinary frequency, Blood in Urine, Weak urinary stream, Discharge, Flank Pain, Incontinence, Painful Urination, Urgency, Urinary Retention and Urinating at Night. Musculoskeletal:Present- Muscle Weakness, Muscle Pain, Joint Swelling, Joint Pain, Back Pain, Morning Stiffness and Spasms. Neurological:Not Present- Tremor, Dizziness, Blackout spells, Paralysis, Difficulty with balance and Weakness. Psychiatric:Not Present- Insomnia.  Vitals Weight: 69 lb Height: 186 in Body Surface Area: 2.03 m Body Mass Index: 1.4 kg/m Pulse: 68 (Regular) Resp.: 14 (Unlabored) BP: 138/82 (Sitting, Right Arm, Standard)  Physical Exam The physical exam findings are as follows:   General Mental Status - Alert, cooperative and good historian. General Appearance- pleasant. Not in acute distress. Orientation- Oriented X3. Build & Nutrition- Well nourished and Well developed.   Head and Neck Head- normocephalic, atraumatic . Neck Global Assessment- supple. no bruit auscultated on the right and no bruit auscultated on the left.   Eye Pupil- Bilateral- Regular and Round. Motion- Bilateral- EOMI.   Chest and Lung Exam Auscultation: Breath sounds:- clear at anterior chest wall and - clear at posterior chest  wall. Adventitious sounds:- No Adventitious sounds.   Cardiovascular Auscultation:Rhythm- Regular rate and rhythm. Heart Sounds- S1 WNL and S2 WNL. Murmurs & Other Heart Sounds:Auscultation of the heart reveals - No Murmurs.   Abdomen Palpation/Percussion:Tenderness- Abdomen is non-tender to palpation. Rigidity (guarding)- Abdomen is soft. Auscultation:Auscultation of the abdomen reveals - Bowel sounds normal.   Male Genitourinary  Not done, not pertinent to present illness  Musculoskeletal On exam a well developed male, alert, and oriented in no apparent distress. He walks with an antalgic gait pattern. His left hip has normal range of motion with no discomfort. His right hip can be flexed to 95, no internal rotation, about 10 external rotation, 20 abduction. His knee exam is normal bilaterally. Pulses sensation and motor intact both lower extremities. Gait pattern is significantly antalgic on the right.  RADIOGRAPHS: AP pelvis and lateral of the right hip shows he has severe endstage arthritis to the right hip, bone on bone with large osteophyte formation and subchondral sclerosis.  Assessment & Plan Osteoarthritis Right Hip  Plan is for a Right Total Hip Arthroplasty by Dr. Aluisio.  Plan is to go home after the hospital stay.  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 He had a chest fluoro performed at [facility] back on Apr 28, 2011 which showed findings concerning for at least partially paralyzed right hemidiaphragm.  PCP - Dr. Gates - Patient has been seen preoperatively by Dr. Gates and felt to stable for upcoming surgery.  Drew Chanley Mcenery, PA-C  

## 2011-08-23 NOTE — Transfer of Care (Signed)
Immediate Anesthesia Transfer of Care Note  Patient: Jason Friedman  Procedure(s) Performed: Procedure(s) (LRB): TOTAL HIP ARTHROPLASTY (Right)  Patient Location: PACU  Anesthesia Type: General  Level of Consciousness: awake, sedated and patient cooperative  Airway & Oxygen Therapy: Patient Spontanous Breathing and Patient connected to face mask oxygen  Post-op Assessment: Report given to PACU RN and Post -op Vital signs reviewed and stable  Post vital signs: Reviewed and stable  Complications: No apparent anesthesia complications

## 2011-08-23 NOTE — Anesthesia Postprocedure Evaluation (Signed)
  Anesthesia Post-op Note  Patient: Jason Friedman  Procedure(s) Performed: Procedure(s) (LRB): TOTAL HIP ARTHROPLASTY (Right)  Patient Location: PACU  Anesthesia Type: General  Level of Consciousness: awake and alert   Airway and Oxygen Therapy: Patient Spontanous Breathing  Post-op Pain: mild  Post-op Assessment: Post-op Vital signs reviewed, Patient's Cardiovascular Status Stable, Respiratory Function Stable, Patent Airway and No signs of Nausea or vomiting  Post-op Vital Signs: stable  Complications: No apparent anesthesia complications

## 2011-08-24 ENCOUNTER — Encounter (HOSPITAL_COMMUNITY): Payer: Self-pay | Admitting: Orthopedic Surgery

## 2011-08-24 LAB — CBC
HCT: 37.3 % — ABNORMAL LOW (ref 39.0–52.0)
Hemoglobin: 12.6 g/dL — ABNORMAL LOW (ref 13.0–17.0)
MCHC: 33.8 g/dL (ref 30.0–36.0)
MCV: 97.6 fL (ref 78.0–100.0)
RDW: 12.8 % (ref 11.5–15.5)
WBC: 18.4 10*3/uL — ABNORMAL HIGH (ref 4.0–10.5)

## 2011-08-24 LAB — BASIC METABOLIC PANEL
BUN: 13 mg/dL (ref 6–23)
Chloride: 103 mEq/L (ref 96–112)
Creatinine, Ser: 1.02 mg/dL (ref 0.50–1.35)
GFR calc Af Amer: 85 mL/min — ABNORMAL LOW (ref >90)
Glucose, Bld: 177 mg/dL — ABNORMAL HIGH (ref 70–99)
Potassium: 5 mEq/L (ref 3.5–5.1)

## 2011-08-24 NOTE — Progress Notes (Signed)
Physical Therapy Treatment Note    08/24/11 1500  PT Visit Information  Last PT Received On 08/24/11  Assistance Needed +1  PT Time Calculation  PT Start Time 1440  PT Stop Time 1456  PT Time Calculation (min) 16 min  Subjective Data  Subjective "It's real sore."  Precautions  Precautions Posterior Hip  Restrictions  Weight Bearing Restrictions Yes  RLE Weight Bearing PWB  RLE Partial Weight Bearing Percentage or Pounds 25-50%  Cognition  Overall Cognitive Status Appears within functional limits for tasks assessed/performed  Bed Mobility  Bed Mobility Supine to Sit;Sit to Supine  Supine to Sit HOB flat;5: Supervision  Sit to Supine 4: Min assist;HOB flat  Details for Bed Mobility Assistance verbal cues for hip precautions, assist for R LE onto bed  Transfers  Transfers Sit to Stand;Stand to Sit  Sit to Stand 5: Supervision;With upper extremity assist;From bed  Stand to Sit 4: Min assist;To bed  Details for Transfer Assistance pt reminded of hip precaution, uncontrolled descent required minA to control 2* pt did not reach back as he attempted to sit  Ambulation/Gait  Ambulation/Gait Assistance 4: Min guard  Ambulation Distance (Feet) 80 Feet  Assistive device Rolling walker  Ambulation/Gait Assistance Details pt did better with PWB and keeping hip precautions, reports increased soreness in R LE with ambulation  Gait Pattern Step-through pattern;Decreased stride length;Decreased stance time - right  Total Joint Exercises  Ankle Circles/Pumps AROM;20 reps;Supine;Both  [employer] reps;Supine;Strengthening  Gluteal Sets Both;Other reps (comment);Supine (20 reps)  Short Arc Quad AROM;Strengthening;Right;Other reps (comment);Supine (15)  Heel Slides AAROM;Strengthening;Right;Other (comment);15 reps;Supine (within precautions)  Hip ABduction/ADduction AAROM;Strengthening;Right;15 reps;Supine  PT - End of Session  Activity Tolerance Patient limited by pain  Patient left  in bed;with call bell/phone within reach;with family/visitor present  Nurse Communication Patient requests pain meds  PT - Assessment/Plan  Comments on Treatment Session Pt performed exercises and ambulated this afternoon however reporting increased pain and soreness in R LE.  Reviewed hip precautions.  PT Plan Discharge plan remains appropriate;Frequency remains appropriate  Follow Up Recommendations Home health PT  Equipment Recommended None recommended by PT  Acute Rehab PT Goals  PT Goal: Sit to Stand - Progress Progressing toward goal  PT Goal: Stand to Sit - Progress Progressing toward goal  PT Goal: Ambulate - Progress Progressing toward goal  PT Goal: Perform Home Exercise Program - Progress Progressing toward goal  Additional Goals  PT Goal: Additional Goal #1 - Progress Progressing toward goal    Zenovia Jarred, PT Pager: [phone]

## 2011-08-24 NOTE — Evaluation (Signed)
Physical Therapy Evaluation Patient Details Name: Jason Friedman MRN: [id number] DOB: [birth year] Today's Date: 08/24/2011 Time: 4098-1191 PT Time Calculation (min): 20 min  PT Assessment / Plan / Recommendation Clinical Impression  Pt s/p R THA.  Pt would benefit from acute PT services in order to improve safety and independence with ambulation and stairs to prepare for safe d/c home with spouse.  Pt slightly impulsive and required constand cuing for hip precautions.    PT Assessment  Patient needs continued PT services    Follow Up Recommendations  Home health PT    Equipment Recommendations  None recommended by PT    Frequency 7X/week    Precautions / Restrictions Precautions Precautions: Posterior Hip Precaution Comments: Handout posted in room Restrictions Weight Bearing Restrictions: Yes RLE Weight Bearing: Partial weight bearing RLE Partial Weight Bearing Percentage or Pounds: 25-50%   Pertinent Vitals/Pain       Mobility  Bed Mobility Bed Mobility: Supine to Sit Supine to Sit: 5: Supervision Details for Bed Mobility Assistance: verbal cues for hip precautions Transfers Transfers: Sit to Stand;Stand to Sit Sit to Stand: 5: Supervision;With upper extremity assist;From chair/3-in-1;From bed Stand to Sit: 5: Supervision;With upper extremity assist;To bed;To chair/3-in-1 Details for Transfer Assistance: pt trying to stand before hip precautions fully explained and PWB, performed again with verbal cues for R LE forward Ambulation/Gait Ambulation/Gait Assistance: 4: Min guard Ambulation Distance (Feet): 200 Feet Assistive device: Rolling walker Ambulation/Gait Assistance Details: pt required frequent verbal cues to use weight through UEs and roll RW for PWB as well as turning towards L side to main hip precaution Gait Pattern: Step-through pattern;Decreased stride length;Decreased stance time - right    Exercises     PT Goals Acute Rehab PT Goals PT Goal  Formulation: With patient Time For Goal Achievement: 08/31/11 Potential to Achieve Goals: Good Pt will go Sit to Stand: with modified independence PT Goal: Sit to Stand - Progress: Goal set today Pt will go Stand to Sit: with modified independence PT Goal: Stand to Sit - Progress: Goal set today Pt will Ambulate: >150 feet;with modified independence;with least restrictive assistive device PT Goal: Ambulate - Progress: Goal set today Pt will Go Up / Down Stairs: with modified independence;3-5 stairs;with least restrictive assistive device PT Goal: Up/Down Stairs - Progress: Goal set today Pt will Perform Home Exercise Program: with supervision, verbal cues required/provided PT Goal: Perform Home Exercise Program - Progress: Goal set today Additional Goals Additional Goal #1: Pt will verbalize and demonstrate all hip precautions and PWB status without cuing. PT Goal: Additional Goal #1 - Progress: Goal set today  Visit Information  Last PT Received On: 08/24/11 Assistance Needed: +1    Subjective Data  Subjective: "I've already been up 3 times."   Prior Functioning  Home Living Lives With: Spouse Type of Home: House Home Access: Stairs to enter [employer] of Steps: 3 Entrance Stairs-Rails: None Home Layout: Able to live on main level with bedroom/bathroom Home Adaptive Equipment: Walker - rolling;Bedside commode/3-in-1 Prior Function Level of Independence: Independent Communication Communication: No difficulties    Cognition  Overall Cognitive Status: Appears within functional limits for tasks assessed/performed Arousal/Alertness: Awake/alert Orientation Level: Appears intact for tasks assessed Behavior During Session: [facility] for tasks performed Cognition - Other Comments: Pt impulsive.    Extremity/Trunk Assessment Right Upper Extremity Assessment RUE ROM/Strength/Tone: [facility] for tasks assessed Left Upper Extremity Assessment LUE ROM/Strength/Tone: WFL for tasks  assessed Right Lower Extremity Assessment RLE ROM/Strength/Tone: Deficits RLE ROM/Strength/Tone Deficits: decreased  hip movement against gravity Left Lower Extremity Assessment LLE ROM/Strength/Tone: WFL for tasks assessed   Balance    End of Session PT - End of Session Equipment Utilized During Treatment: Gait belt Activity Tolerance: Patient tolerated treatment well Patient left: in chair;with call bell/phone within reach;with family/visitor present   Meeyah Ovitt,KATHrine E 08/24/2011, 11:46 AM Pager: [phone]

## 2011-08-24 NOTE — Progress Notes (Signed)
Subjective: 1 Day Post-Op Procedure(s) (LRB): TOTAL HIP ARTHROPLASTY (Right) Patient reports pain as mild.   Patient seen in rounds with Dr. Lequita Halt. Patient is well, and has had no acute complaints or problems We will start therapy today.  Plan is to go Home after hospital stay.  Objective: Vital signs in last 24 hours: Temp:  [97.5 F (36.4 C)-99.8 F (37.7 C)] 98.1 F (36.7 C) (04/30 0540) Pulse Rate:  [63-98] 86  (04/30 0540) Resp:  [14-22] 20  (04/30 0540) BP: (121-156)/(52-87) 147/75 mmHg (04/30 0540) SpO2:  [98 %-100 %] 99 % (04/30 0540) Weight:  [80.74 kg (178 lb)] 80.74 kg (178 lb) (04/29 1805)  Intake/Output from previous day:  Intake/Output Summary (Last 24 hours) at 08/24/11 0825 Last data filed at 08/24/11 0656  Gross per 24 hour  Intake 4628.33 ml  Output   3935 ml  Net 693.33 ml    Intake/Output this shift:    Labs:  Basename 08/24/11 0417  HGB 12.6*    Basename 08/24/11 0417  WBC 18.4*  RBC 3.82*  HCT 37.3*  PLT 226    Basename 08/24/11 0417  NA 136  K 5.0  CL 103  CO2 27  BUN 13  CREATININE 1.02  GLUCOSE 177*  CALCIUM 8.8   No results found for this basename: LABPT:2,INR:2 in the last 72 hours  EXAM General - Patient is Alert, Appropriate and Oriented Extremity - Neurovascular intact Sensation intact distally Dressing - dressing C/D/I Motor Function - intact, moving foot and toes well on exam.  Hemovac pulled without difficulty.  Past Medical History  Diagnosis Date  . Impaired memory     x 2 yrs ago after neck injury  . Arthritis   . Hypothyroidism   . Prostate irregularity     nonfuctional prostate, on testerone gel bid  . Cervical vertebral fracture 25 yrs ago    C 5   . Fracture of cervical vertebrae, multiple  in high school    2 cervical vertebrae fx, 3 dislocated cervical vertebrae  . Heart abnormality     heart anatomicall rotated, causes abnormal ekg  . Hypertension     hx on, none current  . PONV  (postoperative nausea and vomiting)     likes scopolamine patch  . COPD (chronic obstructive pulmonary disease)     mild copd per lov note dr Meredeth Ide 04-16-11 on chart  . Sleep apnea     does not use due to weight loss, does not need now  . Anemia   . 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  Andriea Hasegawa 08/24/2011, 8:25 AM

## 2011-08-25 LAB — CBC
HCT: 27.8 % — ABNORMAL LOW (ref 39.0–52.0)
Hemoglobin: 9.3 g/dL — ABNORMAL LOW (ref 13.0–17.0)
MCH: 32.9 pg (ref 26.0–34.0)
MCHC: 33.5 g/dL (ref 30.0–36.0)
RDW: 12.8 % (ref 11.5–15.5)

## 2011-08-25 LAB — BASIC METABOLIC PANEL
BUN: 16 mg/dL (ref 6–23)
Calcium: 7.9 mg/dL — ABNORMAL LOW (ref 8.4–10.5)
Creatinine, Ser: 1.04 mg/dL (ref 0.50–1.35)
GFR calc Af Amer: 83 mL/min — ABNORMAL LOW (ref >90)
GFR calc non Af Amer: 72 mL/min — ABNORMAL LOW (ref >90)
Glucose, Bld: 121 mg/dL — ABNORMAL HIGH (ref 70–99)

## 2011-08-25 MED ORDER — RIVAROXABAN 10 MG PO TABS: 10.0000 mg | ORAL_TABLET | Freq: Every day | ORAL | Status: DC

## 2011-08-25 MED ORDER — METHOCARBAMOL 500 MG PO TABS: 500.0000 mg | ORAL_TABLET | Freq: Four times a day (QID) | ORAL | Status: AC | PRN

## 2011-08-25 MED ORDER — OXYCODONE HCL 5 MG PO TABS: 5.0000 mg | ORAL_TABLET | ORAL | Status: AC | PRN

## 2011-08-25 NOTE — Discharge Instructions (Signed)
Hip Rehabilitation, Guidelines Following Surgery The results of a hip operation are greatly improved after range of motion and muscle strengthening exercises. Follow all safety measures which are given to protect your hip. If any of these exercises cause increased pain or swelling in your joint, decrease the amount until you are comfortable again. Then slowly increase the exercises. Call your caregiver if you have problems or questions. HOME CARE INSTRUCTIONS  Most of the following instructions are designed to prevent the dislocation of your new hip.  Do not put on socks or shoes without following the instructions of your caregivers.   Sit on high chairs so your hips are not bent more than 90 degrees.   Sit on chairs with arms. Use the chair arms to help push yourself up when arising.   Keep your leg on the side of the operation out in front of you when standing up.   Arrange for the use of a toilet seat elevator so you are not sitting low.   Do not do any exercises or get in any positions that cause your toes to point in (pigeon toed).   Always sleep with a pillow between your legs. Do not lie on your side in sleep with both knees touching the bed.   You may resume a sexual relationship in one month or when given the OK by your caregiver.   Use crutches or walker as long as suggested by your caregivers.   Begin weight bearing with your caregiver's approval.   Avoid periods of inactivity such as sitting longer than an hour when not asleep. This helps prevent blood clots.   Return to work as instructed by your caregiver.   Do not drive a car for 6 weeks or as instructed.   Do not drive while taking narcotics.   Wear elastic stockings until instructed not to.   Make sure you keep all of your appointments after your operation with all of your doctors and caregivers.  RANGE OF MOTION AND STRENGTHENING EXERCISES These exercises are designed to help you keep full movement of your hip  joint. Follow your caregiver's or physical therapist's instructions. Perform all exercises about fifteen times, three times per day or as directed. Exercise both hips, even if you have had only one joint replacement. These exercises can be done on a training (exercise) mat, on the floor, on a table or on a bed. Use whatever works the best and is most comfortable for you. Use music or television while you are exercising so that the exercises are a pleasant break in your day. This will make your life better with the exercises acting as a break in routine you can look forward to.  Lying on your back, slowly slide your foot toward your buttocks, raising your knee up off the floor. Then slowly slide your foot back down until your leg is straight again.   Lying on your back spread your legs as far apart as you can without causing discomfort.   Lying on your side, raise your upper leg and foot straight up from the floor as far as is comfortable. Slowly lower the leg and repeat.   Lying on your back, tighten up the muscle in the front of your thigh (quadriceps muscles). You can do this by keeping your leg straight and trying to raise your heel off the floor. This helps strengthen the largest muscle supporting your knee.   Lying on your back, tighten up the muscles of your buttocks both   with the legs straight and with the knee bent at a comfortable angle while keeping your heel on the floor. Document Released: 11/14/2003 Document Revised: 04/01/2011 Document Reviewed: 11/01/2007 Exit[employer] Patient Information 2012 [employer].  Pick up stool softner and laxative for home. Do not submerge incision under water. May shower. Continue to use ice for pain and swelling from surgery. Hip precautions.  Total Hip Protocol.  Take Xarelto for two and a half more weeks, then discontinue Xarelto. 

## 2011-08-25 NOTE — Progress Notes (Signed)
CARE MANAGEMENT NOTE 08/25/2011  Patient:  Jason Friedman, Jason Friedman   Account Number:  [id number]  Date Initiated:  08/24/2011  Documentation initiated by:  Colleen Can  Subjective/Objective Assessment:   dx rt total hip replacemnt     Action/Plan:   CM spoke with patient and spouse. Plans are for pt to return to his home in [place] where spouse will be caregiver. Already has DME. Pt is requesting Genevieve Norlander for [facility] services   Anticipated DC Date:  08/25/2011   Anticipated DC Plan:  HOME W HOME HEALTH SERVICES  In-house referral  NA      DC Planning Services  CM consult      [facility] Choice  HOME HEALTH   Choice offered to / List presented to:  C-3 Spouse   DME arranged  NA      DME agency  NA     HH arranged  HH-2 PT      [facility] agency  [facility]   Status of service:  Completed, signed off     Comments:  08/25/2011 Raynelle Bring BSN CCM [phone] List of hh agencies placed on shadow chart. HH services to be started day after discharge by [place].

## 2011-08-25 NOTE — Progress Notes (Signed)
Physical Therapy Treatment Patient Details Name: Jason Friedman MRN: [id number] DOB: 1940/08/23 Today's Date: 08/25/2011 Time: 4098-1191 PT Time Calculation (min): 11 min  PT Assessment / Plan / Recommendation Comments on Treatment Session  Pt performed stairs and ambulated however upon ambulation, pt reported slight lightheadedness so assisted back to bed.    Follow Up Recommendations  Home health PT    Equipment Recommendations  None recommended by OT;None recommended by PT    Frequency     Plan Discharge plan remains appropriate;Frequency remains appropriate    Precautions / Restrictions Precautions Precautions: Posterior Hip Restrictions Weight Bearing Restrictions: Yes RLE Weight Bearing: Partial weight bearing RLE Partial Weight Bearing Percentage or Pounds: 25-50%   Pertinent Vitals/Pain     Mobility  Bed Mobility Bed Mobility: Supine to Sit;Sit to Supine Supine to Sit: 5: Supervision;HOB flat Sit to Supine: 5: Supervision;HOB flat Details for Bed Mobility Assistance: verbal cues for hip precautions, decreased ability to move R LE so educated to use UEs or L LE to assist  Transfers Transfers: Sit to Stand;Stand to Sit Sit to Stand: 5: Supervision;With upper extremity assist;From bed Stand to Sit: 5: Supervision;With upper extremity assist;To bed Details for Transfer Assistance: verbal cues for hip precautions prior to performing bed mobility Ambulation/Gait Ambulation/Gait Assistance: 4: Min guard Ambulation Distance (Feet): 80 Feet Assistive device: Rolling walker Ambulation/Gait Assistance Details: pt reported dizziness but felt okay to ambulate back to room so assisted to room  Gait Pattern: Step-through pattern;Decreased stride length;Antalgic Stairs: Yes Stairs Assistance: 4: Min assist Stairs Assistance Details (indicate cue type and reason): verbal cues for sequence, and RW placement, provided handout Stair Management Technique: Backwards;Step to  pattern;With walker Number of Stairs: 2  (performed x2)    Exercises     PT Goals Acute Rehab PT Goals PT Goal: Sit to Stand - Progress: Progressing toward goal PT Goal: Stand to Sit - Progress: Progressing toward goal PT Goal: Ambulate - Progress: Progressing toward goal PT Goal: Up/Down Stairs - Progress: Progressing toward goal  Visit Information  Last PT Received On: 08/25/11 Assistance Needed: +1    Subjective Data  Subjective: "I feel lightheaded."  (upon ambulation)   Cognition  Overall Cognitive Status: Appears within functional limits for tasks assessed/performed Arousal/Alertness: Awake/alert Orientation Level: Appears intact for tasks assessed Behavior During Session: [facility] for tasks performed Cognition - Other Comments: needs reinforcement with THPs during tasks    Balance     End of Session PT - End of Session Equipment Utilized During Treatment: Gait belt Activity Tolerance: Patient limited by fatigue Patient left: in bed;with call bell/phone within reach    [facility] E 08/25/2011, 9:34 AM Pager: [phone]

## 2011-08-25 NOTE — Progress Notes (Signed)
Pt d/c to home today with [facility]PT[facility]. IV d/c'd.Dressing CDI to Hip. Dressing supplies given for home use. No changes in am assessments today. D/C instructions & RX's given to wife & pt with verbailized understanding. Wife to assist pt with discharge home.

## 2011-08-25 NOTE — Progress Notes (Signed)
Physical Therapy Treatment Note   08/25/11 1400  PT Visit Information  Last PT Received On 08/25/11  Assistance Needed +1  PT Time Calculation  PT Start Time 1414  PT Stop Time 1426  PT Time Calculation (min) 12 min  Subjective Data  Subjective "watch how i do this."  (turning and doing it correctly)  Precautions  Precautions Posterior Hip  Restrictions  Weight Bearing Restrictions Yes  RLE Weight Bearing PWB  RLE Partial Weight Bearing Percentage or Pounds 25-50%  Cognition  Overall Cognitive Status Appears within functional limits for tasks assessed/performed  Transfers  Transfers Stand to Sit;Sit to Stand  Sit to Stand 5: Supervision;With upper extremity assist;From chair/3-in-1  Stand to Sit 5: Supervision;With upper extremity assist;To chair/3-in-1  Details for Transfer Assistance performed correct technique without cues, pt up ambulating in room on arrival and sat in chair to don shoes  Ambulation/Gait  Ambulation/Gait Assistance 5: Supervision  Ambulation Distance (Feet) 140 Feet  Assistive device Rolling walker  Ambulation/Gait Assistance Details while ambulating in room pt going around tray table to placed RW too far away and instructed to keep close for safety otherwise did well in hallway  Gait Pattern Step-through pattern;Decreased stride length;Antalgic  PT - End of Session  Activity Tolerance Patient tolerated treatment well  Patient left in chair;with family/visitor present;with call bell/phone within reach  PT - Assessment/Plan  Comments on Treatment Session Pt ambulated for 2nd time today and did well.  Spouse reports she reviewed stair handout and has no questions.  Educated on safe car transfers.  Pt and spouse have no other concerns/questions and feel ready for d/c.  PT Plan Discharge plan remains appropriate;Frequency remains appropriate  Follow Up Recommendations Home health PT  Equipment Recommended None recommended by OT;None recommended by PT  Acute  Rehab PT Goals  PT Goal: Sit to Stand - Progress Progressing toward goal  PT Goal: Stand to Sit - Progress Progressing toward goal  PT Goal: Ambulate - Progress Progressing toward goal  Additional Goals  PT Goal: Additional Goal #1 - Progress Met    Zenovia Jarred, PT Pager: [phone]

## 2011-08-25 NOTE — Evaluation (Signed)
Occupational Therapy Evaluation Patient Details Name: Jason Friedman MRN: [id number] DOB: Oct 30, 1940 Today's Date: 08/25/2011 Time: 7829-5621 OT Time Calculation (min): 25 min  OT Assessment / Plan / Recommendation Clinical Impression  This 71year old man was admitted for R THA.  He has posterior THPS and PWB. He would benefit from OT to reinforce precautions during ADLs for safe D/C home    OT Assessment  Patient needs continued OT Services    Follow Up Recommendations  Home health OT    Equipment Recommendations  None recommended by OT;None recommended by PT    Frequency Min 2X/week    Precautions / Restrictions Precautions Precautions: Posterior Hip Restrictions Weight Bearing Restrictions: Yes RLE Weight Bearing: Partial weight bearing RLE Partial Weight Bearing Percentage or Pounds: 25-50%   Pertinent Vitals/Pain Pt initially lightheaded when he sat up and then stood:  pased quickly.  Pain 2 - 5/10    ADL  Eating/Feeding: Simulated;Independent Where Assessed - Eating/Feeding: Chair Grooming: Performed;Set up;Supervision/safety;Other (comment) (cues for 90 degrees) Where Assessed - Grooming: Unsupported sitting Upper Body Bathing: Simulated;Supervision/safety Where Assessed - Upper Body Bathing: Sitting, chair;Unsupported Lower Body Bathing: Simulated;Moderate assistance Where Assessed - Lower Body Bathing: Sit to stand from chair Upper Body Dressing: Simulated;Minimal assistance;Other (comment) (iv) Where Assessed - Upper Body Dressing: Sitting, chair;Unsupported Lower Body Dressing: Simulated;Maximal assistance Where Assessed - Lower Body Dressing: Sit to stand from chair (plans to have wife A; showed AE. Will get reacher) Toilet Transfer: Performed;Supervision/safety;Other (comment) (min cues arm and leg position) Toilet Transfer Method: Ambulating Toilet Transfer Equipment: Bedside commode Toileting - Clothing Manipulation: Simulated;Supervision/safety Where  Assessed - Glass blower/designer Manipulation: Standing Toileting - Hygiene: Simulated;Supervision/safety Where Assessed - Toileting Hygiene: Standing Tub/Shower Transfer: Engineer, manufacturing Method: Science writer: Walk in Scientist, research (physical sciences) Used: Reacher;Rolling walker Ambulation Related to ADLs: Min cues for internal rotation with ambulation and bed mobility: needs reinforcement ADL Comments: Can state THPs:  needs min cues during ADL/mobility tasks.  Plans to have wife A with ADLs     OT Goals Acute Rehab OT Goals OT Goal Formulation: With patient Time For Goal Achievement: 09/01/11 Potential to Achieve Goals: Good ADL Goals Pt Will Transfer to Toilet: with modified independence;Ambulation;3-in-1 ADL Goal: Toilet Transfer - Progress: Goal set today Miscellaneous OT Goals Miscellaneous OT Goal #1: Pt will perform ADL tasks without cues for THPs (mod I level) OT Goal: Miscellaneous Goal #1 - Progress: Goal set today  Visit Information  Last OT Received On: 08/25/11 Assistance Needed: +1    Subjective Data  Subjective: "I'll get one of those (reacher)" Patient Stated Goal: home today   Prior Functioning  Home Living Bathroom Shower/Tub: Walk-in shower Bathroom Toilet: Standard (has 3:1) Home Adaptive Equipment: Walker - rolling;Bedside commode/3-in-1 Prior Function Level of Independence: Independent Communication Communication: No difficulties Dominant Hand: Right    Cognition  Overall Cognitive Status: Appears within functional limits for tasks assessed/performed Arousal/Alertness: Awake/alert Orientation Level: Appears intact for tasks assessed Behavior During Session: [facility] for tasks performed Cognition - Other Comments: needs reinforcement with THPs during tasks    Extremity/Trunk Assessment Right Upper Extremity Assessment RUE ROM/Strength/Tone: [facility] for tasks assessed Left Upper Extremity Assessment LUE  ROM/Strength/Tone: WFL for tasks assessed   Mobility Bed Mobility Bed Mobility: Supine to Sit Supine to Sit: HOB flat;4: Min assist Transfers Transfers: Sit to Stand Sit to Stand: 5: Supervision;With upper extremity assist;From bed   Exercise    Balance    End of Session OT - End of Session  Equipment Utilized During Treatment: Gait belt Activity Tolerance: Patient tolerated treatment well Patient left: in chair;with call bell/phone within reach  [facility], OTR/L [phone] 08/25/2011 Jason Friedman 08/25/2011, 8:44 AM

## 2011-08-25 NOTE — Progress Notes (Signed)
Subjective: 2 Days Post-Op Procedure(s) (LRB): TOTAL HIP ARTHROPLASTY (Right) Patient reports pain as mild.   Patient seen in rounds with Dr. Lequita Halt. Patient is well, and has had no acute complaints or problems  Objective: Vital signs in last 24 hours: Temp:  [97.9 F (36.6 C)-98.9 F (37.2 C)] 98.9 F (37.2 C) (05/01 0623) Pulse Rate:  [66-87] 66  (05/01 0623) Resp:  [16-18] 16  (05/01 0623) BP: (118-125)/(60-71) 120/68 mmHg (05/01 0623) SpO2:  [94 %-98 %] 96 % (05/01 0623)  Intake/Output from previous day:  Intake/Output Summary (Last 24 hours) at 08/25/11 1018 Last data filed at 08/25/11 0900  Gross per 24 hour  Intake 2564.67 ml  Output   1900 ml  Net 664.67 ml    Intake/Output this shift: Total I/O In: 240 [P.O.:240] Out: -   Labs:  Basename 08/25/11 0420 08/24/11 0417  HGB 9.3* 12.6*    Basename 08/25/11 0420 08/24/11 0417  WBC 9.3 18.4*  RBC 2.83* 3.82*  HCT 27.8* 37.3*  PLT 199 226    Basename 08/25/11 0420 08/24/11 0417  NA 140 136  K 4.3 5.0  CL 106 103  CO2 30 27  BUN 16 13  CREATININE 1.04 1.02  GLUCOSE 121* 177*  CALCIUM 7.9* 8.8   No results found for this basename: LABPT:2,INR:2 in the last 72 hours  EXAM: General - Patient is Alert, Appropriate and Oriented Extremity - Neurovascular intact Sensation intact distally Incision - clean, dry, no drainage, healing Motor Function - intact, moving foot and toes well on exam.   Assessment/Plan: 2 Days Post-Op Procedure(s) (LRB): TOTAL HIP ARTHROPLASTY (Right) Procedure(s) (LRB): TOTAL HIP ARTHROPLASTY (Right) Past Medical History  Diagnosis Date  . Impaired memory     x 2 yrs ago after neck injury  . Arthritis   . Hypothyroidism   . Prostate irregularity     nonfuctional prostate, on testerone gel bid  . Cervical vertebral fracture 25 yrs ago    C 5   . Fracture of cervical vertebrae, multiple  in high school    2 cervical vertebrae fx, 3 dislocated cervical vertebrae  . Heart  abnormality     heart anatomicall rotated, causes abnormal ekg  . Hypertension     hx on, none current  . PONV (postoperative nausea and vomiting)     likes scopolamine patch  . COPD (chronic obstructive pulmonary disease)     mild copd per lov note dr Meredeth Ide 04-16-11 on chart  . Sleep apnea     does not use due to weight loss, does not need now  . Anemia   . 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

## 2011-09-08 NOTE — Discharge Summary (Signed)
Physician Discharge Summary   Patient ID: Jason Friedman MRN: [id number] DOB/AGE: 69-May-1942 69 y.o.  Admit date: 08/23/2011 Discharge date: 08/25/2011  Primary Diagnosis: Osteoarthritis Right Hip   Admission Diagnoses:  Past Medical History  Diagnosis Date  . Impaired memory     x 2 yrs ago after neck injury  . Arthritis   . Hypothyroidism   . Prostate irregularity     nonfuctional prostate, on testerone gel bid  . Cervical vertebral fracture 25 yrs ago    C 5   . Fracture of cervical vertebrae, multiple  in high school    2 cervical vertebrae fx, 3 dislocated cervical vertebrae  . Heart abnormality     heart anatomicall rotated, causes abnormal ekg  . Hypertension     hx on, none current  . PONV (postoperative nausea and vomiting)     likes scopolamine patch  . COPD (chronic obstructive pulmonary disease)     mild copd per lov note dr Meredeth Ide 04-16-11 on chart  . Sleep apnea     does not use due to weight loss, does not need now  . Anemia   . 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 male with end stage arthritis of his right hip with progressively worsening pain and dysfunction. Pain occurs with activity and rest including pain at night. He has tried analgesics, protected weight bearing and rest without benefit. Pain is too severe to attempt physical therapy. Radiographs demonstrate bone on bone arthritis with subchondral cyst formation. He presents now for right THA.      Laboratory Data: Hospital Outpatient Visit on 08/12/2011  Component Date Value Range Status  . MRSA, PCR  08/12/2011 NEGATIVE  NEGATIVE Final  . Staphylococcus aureus  08/12/2011 NEGATIVE  NEGATIVE Final   Comment:                                 The Xpert SA Assay (FDA                   approved for NASAL specimens                          only), is one component of                          a comprehensive surveillance                          program.  It is not intended                          to diagnose infection nor to                          guide or monitor treatment.  . WBC (K/uL) 08/12/2011 8.3  4.0-10.5 Final  . RBC (MIL/uL) 08/12/2011 5.10  4.22-5.81 Final  . Hemoglobin (g/dL) 30/86/5784 69.6  29.5-28.4 Final  . HCT (%) 08/12/2011 50.2  39.0-52.0 Final  . MCV (fL) 08/12/2011 98.4  78.0-100.0 Final  .  MCH (pg) 08/12/2011 33.3  26.0-34.0 Final  . MCHC (g/dL) 16/60/6301 60.1  09.3-23.5 Final  . RDW (%) 08/12/2011 12.8  11.5-15.5 Final  . Platelets (K/uL) 08/12/2011 267  150-400 Final  . Prothrombin Time (seconds) 08/12/2011 13.3  11.6-15.2 Final  . INR  08/12/2011 0.99  0.00-1.49 Final  . aPTT (seconds) 08/12/2011 33  24-37 Final  . Sodium (mEq/L) 08/12/2011 137  135-145 Final  . Potassium (mEq/L) 08/12/2011 4.4  3.5-5.1 Final  . Chloride (mEq/L) 08/12/2011 102  96-112 Final  . CO2 (mEq/L) 08/12/2011 29  19-32 Final  . Glucose, Bld (mg/dL) 57/32/2025 427* 06-23 Final  . BUN (mg/dL) 76/28/3151 18  7-61 Final  . Creatinine, Ser (mg/dL) 60/73/7106 2.69  4.85-4.62 Final  . Calcium (mg/dL) 70/35/0093 9.5  8.1-82.9 Final  . Total Protein (g/dL) 93/71/6967 6.8  8.9-3.8 Final  . Albumin (g/dL) 02/09/5101 3.9  5.8-5.2 Final  . AST (U/L) 08/12/2011 19  0-37 Final  . ALT (U/L) 08/12/2011 14  0-53 Final  . Alkaline Phosphatase (U/L) 08/12/2011 54  39-117 Final  . Total Bilirubin (mg/dL) 77/82/4235 0.6  3.6-1.4 Final  . GFR calc non Af Amer (mL/min) 08/12/2011 83* >90 Final  . GFR calc Af Amer (mL/min) 08/12/2011 >90  >90 Final   Comment:                                 The eGFR has been calculated                          using the CKD EPI equation.                          This calculation has not been                          validated in all  clinical                          situations.                          eGFR's persistently                          <90 mL/min signify                          possible Chronic Kidney Disease.  . Color, Urine  08/12/2011 YELLOW  YELLOW Final  . APPearance  08/12/2011 CLEAR  CLEAR Final  . Specific Gravity, Urine  08/12/2011 1.023  1.005-1.030 Final  . pH  08/12/2011 6.0  5.0-8.0 Final  . Glucose, UA (mg/dL) 43/15/4008 NEGATIVE  NEGATIVE Final  . Hgb urine dipstick  08/12/2011 NEGATIVE  NEGATIVE Final  . Bilirubin Urine  08/12/2011 NEGATIVE  NEGATIVE Final  . Ketones, ur (mg/dL) 67/61/9509 NEGATIVE  NEGATIVE Final  . Protein, ur (mg/dL) 32/67/1245 NEGATIVE  NEGATIVE Final  . Urobilinogen, UA (mg/dL) 80/99/8338 0.2  2.5-0.5 Final  . Nitrite  08/12/2011 NEGATIVE  NEGATIVE Final  . Leukocytes, UA  08/12/2011 NEGATIVE  NEGATIVE Final   MICROSCOPIC NOT DONE ON URINES WITH NEGATIVE PROTEIN, BLOOD, LEUKOCYTES, NITRITE, OR GLUCOSE <1000 mg/dL.   No results found for this basename:  HGB:5 in the last 72 hours No results found for this basename: WBC:2,RBC:2,HCT:2,PLT:2 in the last 72 hours No results found for this basename: NA:2,K:2,CL:2,CO2:2,BUN:2,CREATININE:2,GLUCOSE:2,CALCIUM:2 in the last 72 hours No results found for this basename: LABPT:2,INR:2 in the last 72 hours  X-Rays:Dg Chest 2 View  08/12/2011  *RADIOLOGY REPORT*  Clinical Data: Preop right hip replacement  CHEST - 2 VIEW  Comparison: None.  Findings: Cardiomediastinal silhouette is unremarkable.  No acute infiltrate or pleural effusion.  No pulmonary edema.  There is elevation of the right hemidiaphragm.  Bony thorax is unremarkable.  IMPRESSION: No active disease.  Elevation of the right hemidiaphragm.  Original Report Authenticated By: Natasha Mead, M.D.   X-ray Hip Right Ap And Lateral  08/12/2011  *RADIOLOGY REPORT*  Clinical Data: Preop for right hip arthroplasty  RIGHT HIP - COMPLETE 2+ VIEW  Comparison: None.  Findings: Three  views of the right hip submitted.  Extensive degenerative changes are noted right hip joint with significant narrowing of superior joint space.  Mild sclerosis of the right superior acetabulum.  There is spurring of the right femoral head. Mild degenerative changes lumbar spine.  Pelvic phleboliths are noted.  IMPRESSION: .  No acute fracture or subluxation.  Degenerative changes as described above.  Original Report Authenticated By: Natasha Mead, M.D.   Dg Pelvis Portable  08/23/2011  *RADIOLOGY REPORT*  Clinical Data: Post right hip surgery  PORTABLE PELVIS  Comparison: Portable exam 1657 hours compared to 08/12/2011  Findings: Interval resection of the right femoral head/neck and placement of a right hip prosthesis. Acetabular femoral components in expected positions. No acute fracture or dislocation seen, though the distal aspect of the femoral component is not imaged. Surgical drain is present at site. Visualized pelvis intact.  IMPRESSION: Right hip prosthesis without acute complication as above.  Original Report Authenticated By: Lollie Marrow, M.D.   Dg Hip Portable 1 View Right  08/23/2011  *RADIOLOGY REPORT*  Clinical Data: Post right hip surgery  PORTABLE RIGHT HIP - 1 VIEW  Comparison: Portable exam 1659 hours comparedto 08/12/2011  Findings: Interval placement of the acetabular and femoral components of the right hip prosthesis. Components appear in expected positions without fracture or dislocation. Surgical drain overlies the surgical site. Visualized portion of right pelvis appears intact.  IMPRESSION: Right hip prosthesis without acute complication.  Original Report Authenticated By: Lollie Marrow, M.D.    EKG: Orders placed during the hospital encounter of 08/23/11  . EKG     Hospital Course: Patient was admitted to [facility] and taken to the OR and underwent the above state procedure without complications.  Patient tolerated the procedure well and was later transferred to the  recovery room and then to the orthopaedic floor for postoperative care.  They were given PO and IV analgesics for pain control following their surgery.  They were given 24 hours of postoperative antibiotics and started on DVT prophylaxis in the form of Xarelto.   PT and OT were ordered for total hip protocol.  The patient was allowed to be PWB with therapy. Discharge planning was consulted to help with postop disposition and equipment needs.  Patient had a good night on the evening of surgery and started to get up OOB with therapy on day one.  Hemovac drain was pulled without difficulty.  The knee immobilizer was removed and discontinued.  Continued to work with therapy into day two and progressed very well.  Dressing was changed on day two and the incision was  healing well.    Patient was seen in rounds and was ready to go home later that afternoon on day two  Discharge Medications: Prior to Admission medications   Medication Sig Start Date End Date Taking? Authorizing Provider  levothyroxine (SYNTHROID, LEVOTHROID) 125 MCG tablet Take 125 mcg by mouth daily with breakfast.   Yes Historical Provider, MD  liothyronine (CYTOMEL) 25 MCG tablet Take 25 mcg by mouth daily with breakfast.   Yes Historical Provider, MD  zolpidem (AMBIEN CR) 12.5 MG CR tablet Take 12.5 mg by mouth at bedtime as needed. For sleep    Yes Historical Provider, MD  rivaroxaban (XARELTO) 10 MG TABS tablet Take 1 tablet (10 mg total) by mouth daily with breakfast. Take for two and a half more weeks, then discontinue Xarelto. 08/25/11   Morissa Obeirne Julien Girt, PA    Diet: Cardiac diet Activity:PWB No bending hip over 90 degrees- A "L" Angle Do not cross legs Do not let foot roll inward When turning these patients a pillow should be placed between the patient's legs to prevent crossing. Patients should have the affected knee fully extended when trying to sit or stand from all surfaces to prevent excessive hip flexion. When ambulating  and turning toward the affected side the affected leg should have the toes turned out prior to moving the walker and the rest of patient's body as to prevent internal rotation/ turning in of the leg. Abduction pillows are the most effective way to prevent a patient from not crossing legs or turning toes in at rest. If an abduction pillow is not ordered placing a regular pillow length wise between the patient's legs is also an effective reminder. It is imperative that these precautions be maintained so that the surgical hip does not dislocate. Follow-up:in 2 weeks Disposition - Home Discharged Condition: good   Discharge Orders    Future Orders Please Complete By Expires   Diet - low sodium heart healthy      Call MD / Call 911      Comments:   If you experience chest pain or shortness of breath, CALL 911 and be transported to the hospital emergency room.  If you develope a fever above 101 F, pus (white drainage) or increased drainage or redness at the wound, or calf pain, call your surgeon's office.   Discharge instructions      Comments:   Pick up stool softner and laxative for home. Do not submerge incision under water. May shower. Continue to use ice for pain and swelling from surgery. Hip precautions.  Total Hip Protocol.  Take Xarelto for two and a half more weeks, then discontinue Xarelto.   Constipation Prevention      Comments:   Drink plenty of fluids.  Prune juice may be helpful.  You may use a stool softener, such as Colace (over the counter) 100 mg twice a day.  Use MiraLax (over the counter) for constipation as needed.   Increase activity slowly as tolerated      Weight Bearing as taught in Physical Therapy      Comments:   Use a walker or crutches as instructed.   Patient may shower      Comments:   You may shower without a dressing once there is no drainage.  Do not wash over the wound.  If drainage remains, do not shower until drainage stops.   Driving restrictions       Comments:   No driving until released by the physician.  Lifting restrictions      Comments:   No lifting until released by the physician.   Follow the hip precautions as taught in Physical Therapy      Change dressing      Comments:   You may change your dressing dressing daily with sterile 4 x 4 inch gauze dressing and paper tape.  Do not submerge the incision under water.   TED hose      Comments:   Use stockings (TED hose) for 3 weeks on both leg(s).  You may remove them at night for sleeping.   Do not sit on low chairs, stoools or toilet seats, as it may be difficult to get up from low surfaces        Medication List  As of 09/08/2011 10:21 PM   STOP taking these medications         cholecalciferol 1000 UNITS tablet      HYDROcodone-acetaminophen 5-325 MG per tablet      testosterone 50 MG/5GM Gel      vitamin B-12 1000 MCG tablet         TAKE these medications         levothyroxine 125 MCG tablet   Commonly known as: SYNTHROID, LEVOTHROID   Take 125 mcg by mouth daily with breakfast.      liothyronine 25 MCG tablet   Commonly known as: CYTOMEL   Take 25 mcg by mouth daily with breakfast.      rivaroxaban 10 MG Tabs tablet   Commonly known as: XARELTO   Take 1 tablet (10 mg total) by mouth daily with breakfast. Take for two and a half more weeks, then discontinue Xarelto.      zolpidem 12.5 MG CR tablet   Commonly known as: AMBIEN CR   Take 12.5 mg by mouth at bedtime as needed. For sleep             Follow-up Information    Follow up with Loanne Drilling, MD. Schedule an appointment as soon as possible for a visit in 2 weeks.   Contact information:   [facility] [address], [address] [place] [address] [phone]          Signed: Patrica Duel 09/08/2011, 10:21 PM

## 2011-09-27 DIAGNOSIS — M169 Osteoarthritis of hip, unspecified: Secondary | ICD-10-CM | POA: Diagnosis not present

## 2011-10-01 DIAGNOSIS — M169 Osteoarthritis of hip, unspecified: Secondary | ICD-10-CM | POA: Diagnosis not present

## 2011-10-04 DIAGNOSIS — M169 Osteoarthritis of hip, unspecified: Secondary | ICD-10-CM | POA: Diagnosis not present

## 2011-10-07 DIAGNOSIS — M169 Osteoarthritis of hip, unspecified: Secondary | ICD-10-CM | POA: Diagnosis not present

## 2011-10-13 DIAGNOSIS — Z79899 Other long term (current) drug therapy: Secondary | ICD-10-CM | POA: Diagnosis not present

## 2011-10-13 DIAGNOSIS — R630 Anorexia: Secondary | ICD-10-CM | POA: Diagnosis not present

## 2011-10-13 DIAGNOSIS — E039 Hypothyroidism, unspecified: Secondary | ICD-10-CM | POA: Diagnosis not present

## 2011-10-13 DIAGNOSIS — Z0000 Encounter for general adult medical examination without abnormal findings: Secondary | ICD-10-CM | POA: Diagnosis not present

## 2011-10-13 DIAGNOSIS — R042 Hemoptysis: Secondary | ICD-10-CM | POA: Diagnosis not present

## 2011-10-13 DIAGNOSIS — E291 Testicular hypofunction: Secondary | ICD-10-CM | POA: Diagnosis not present

## 2011-10-13 DIAGNOSIS — R319 Hematuria, unspecified: Secondary | ICD-10-CM | POA: Diagnosis not present

## 2011-10-13 DIAGNOSIS — Z1331 Encounter for screening for depression: Secondary | ICD-10-CM | POA: Diagnosis not present

## 2011-11-08 DIAGNOSIS — J449 Chronic obstructive pulmonary disease, unspecified: Secondary | ICD-10-CM | POA: Diagnosis not present

## 2011-11-16 DIAGNOSIS — M1710 Unilateral primary osteoarthritis, unspecified knee: Secondary | ICD-10-CM | POA: Diagnosis not present

## 2012-02-01 DIAGNOSIS — M169 Osteoarthritis of hip, unspecified: Secondary | ICD-10-CM | POA: Diagnosis not present

## 2012-07-11 DIAGNOSIS — J449 Chronic obstructive pulmonary disease, unspecified: Secondary | ICD-10-CM | POA: Diagnosis not present

## 2012-08-22 DIAGNOSIS — Z96649 Presence of unspecified artificial hip joint: Secondary | ICD-10-CM | POA: Diagnosis not present

## 2012-10-18 DIAGNOSIS — R042 Hemoptysis: Secondary | ICD-10-CM | POA: Diagnosis not present

## 2012-10-18 DIAGNOSIS — E039 Hypothyroidism, unspecified: Secondary | ICD-10-CM | POA: Diagnosis not present

## 2012-10-18 DIAGNOSIS — Z0000 Encounter for general adult medical examination without abnormal findings: Secondary | ICD-10-CM | POA: Diagnosis not present

## 2012-10-18 DIAGNOSIS — R319 Hematuria, unspecified: Secondary | ICD-10-CM | POA: Diagnosis not present

## 2012-10-18 DIAGNOSIS — Z79899 Other long term (current) drug therapy: Secondary | ICD-10-CM | POA: Diagnosis not present

## 2012-10-18 DIAGNOSIS — E291 Testicular hypofunction: Secondary | ICD-10-CM | POA: Diagnosis not present

## 2012-10-18 DIAGNOSIS — I10 Essential (primary) hypertension: Secondary | ICD-10-CM | POA: Diagnosis not present

## 2012-10-18 DIAGNOSIS — Z1331 Encounter for screening for depression: Secondary | ICD-10-CM | POA: Diagnosis not present

## 2013-02-01 DIAGNOSIS — E291 Testicular hypofunction: Secondary | ICD-10-CM | POA: Diagnosis not present

## 2013-02-01 DIAGNOSIS — M169 Osteoarthritis of hip, unspecified: Secondary | ICD-10-CM | POA: Diagnosis not present

## 2013-02-09 DIAGNOSIS — Z23 Encounter for immunization: Secondary | ICD-10-CM | POA: Diagnosis not present

## 2013-02-09 DIAGNOSIS — R05 Cough: Secondary | ICD-10-CM | POA: Diagnosis not present

## 2013-02-09 DIAGNOSIS — J449 Chronic obstructive pulmonary disease, unspecified: Secondary | ICD-10-CM | POA: Diagnosis not present

## 2013-02-09 DIAGNOSIS — J029 Acute pharyngitis, unspecified: Secondary | ICD-10-CM | POA: Diagnosis not present

## 2013-06-17 IMAGING — RF DG CHEST FLUORO
1 series · 7 of 7 positions shown · non-contrast
Comparison: none

REASON FOR EXAM: rt diaphragm elevated  SNIFF
COMMENTS:

PROCEDURE:     FL  - FL CHEST FLUORO W/ SPOT FILMS  - April 28, 2011  [DATE]
RESULT:     Comparison: None.
TECHNIQUE: The diaphragm was monitored with intermittent fluoroscopy.

[Series 1: run · 2 acquisitions, 7 frames shown]
[im 1/2]
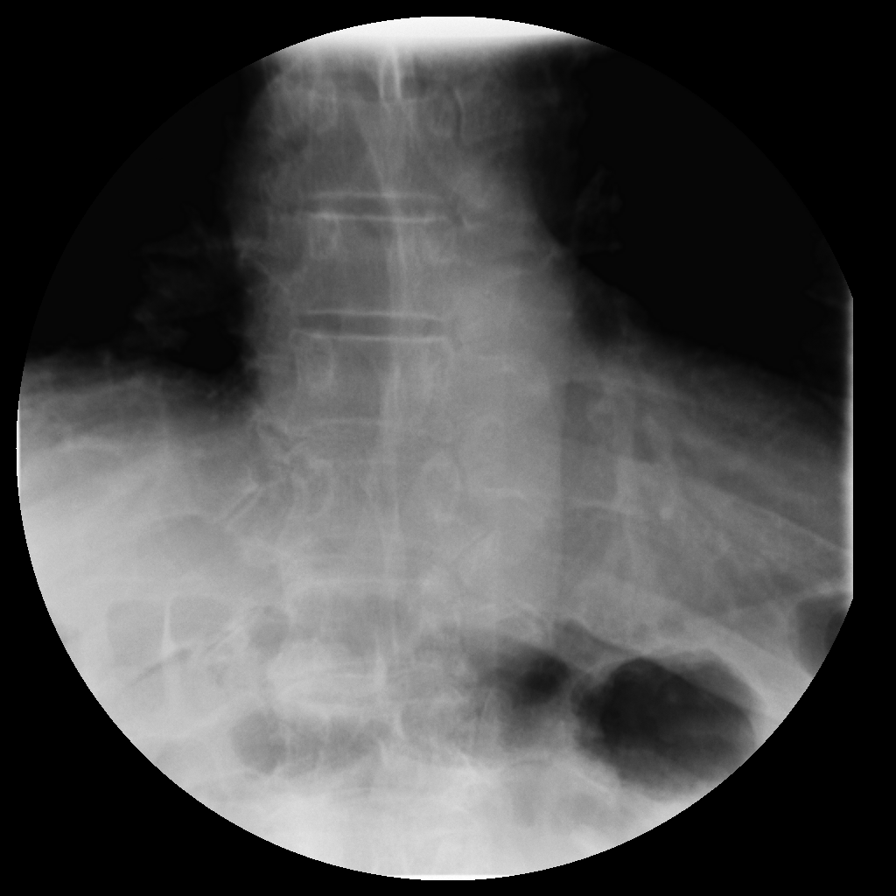
[im 1/2]
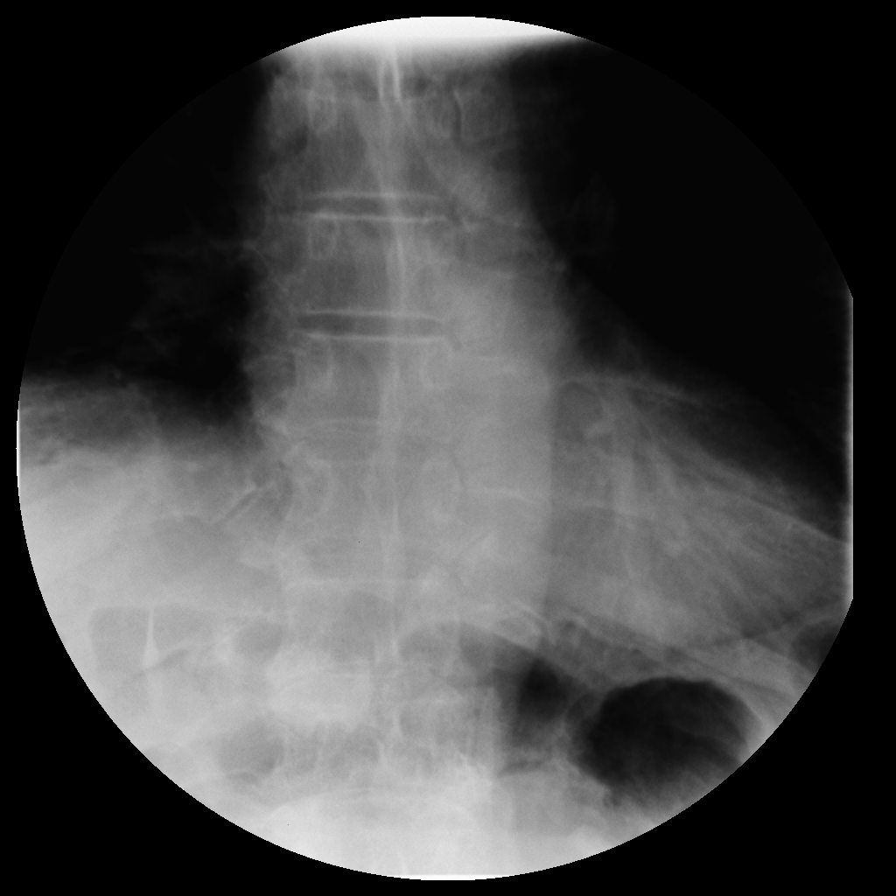
[im 1/2]
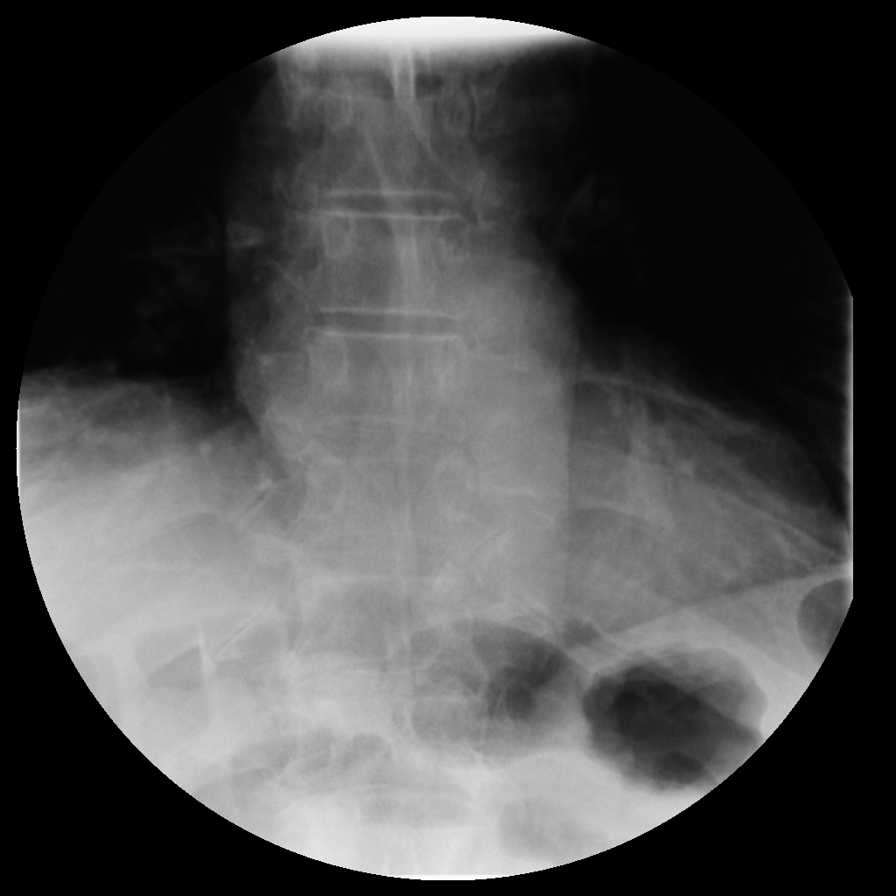
[im 1/2]
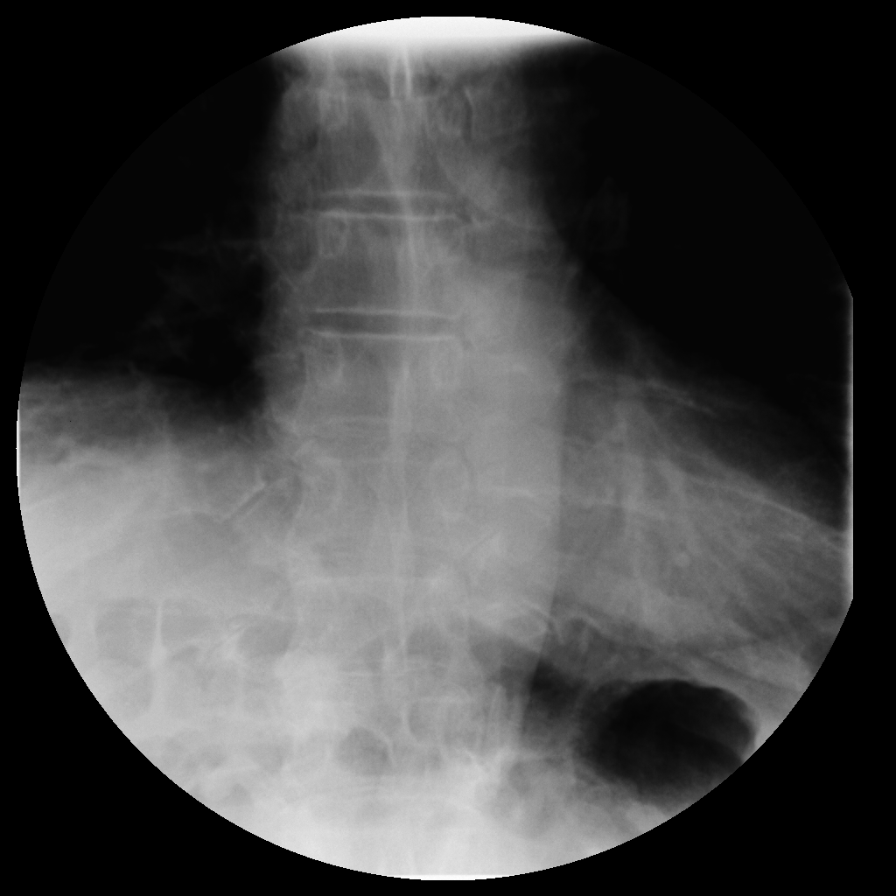
[im 2/2]
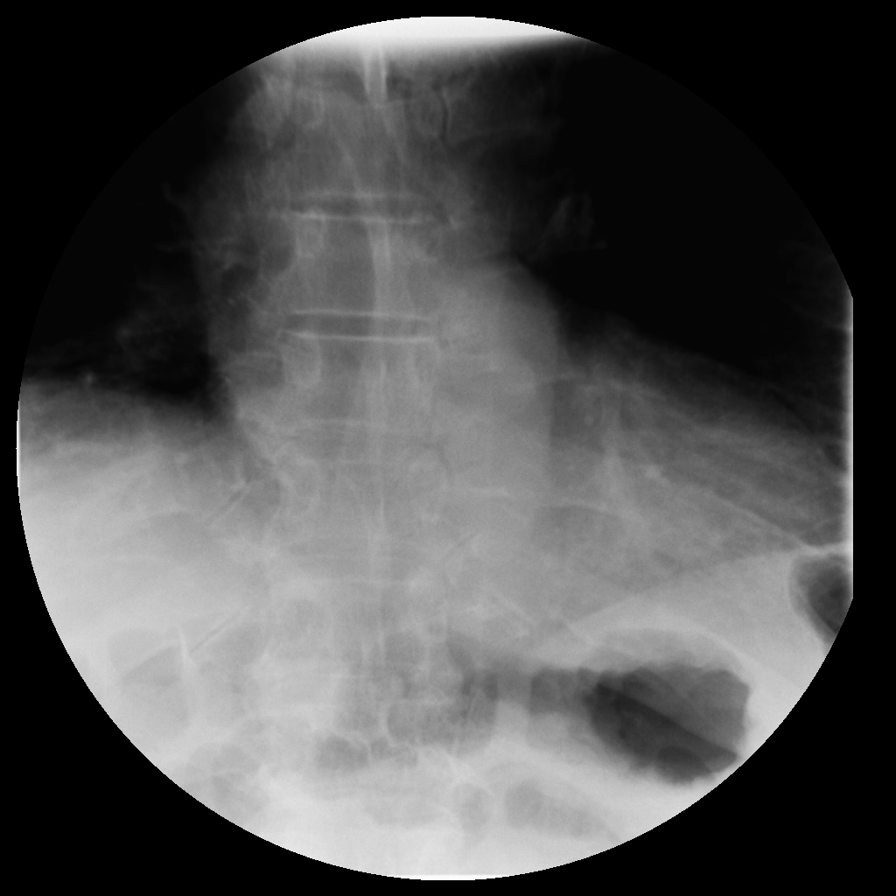
[im 2/2]
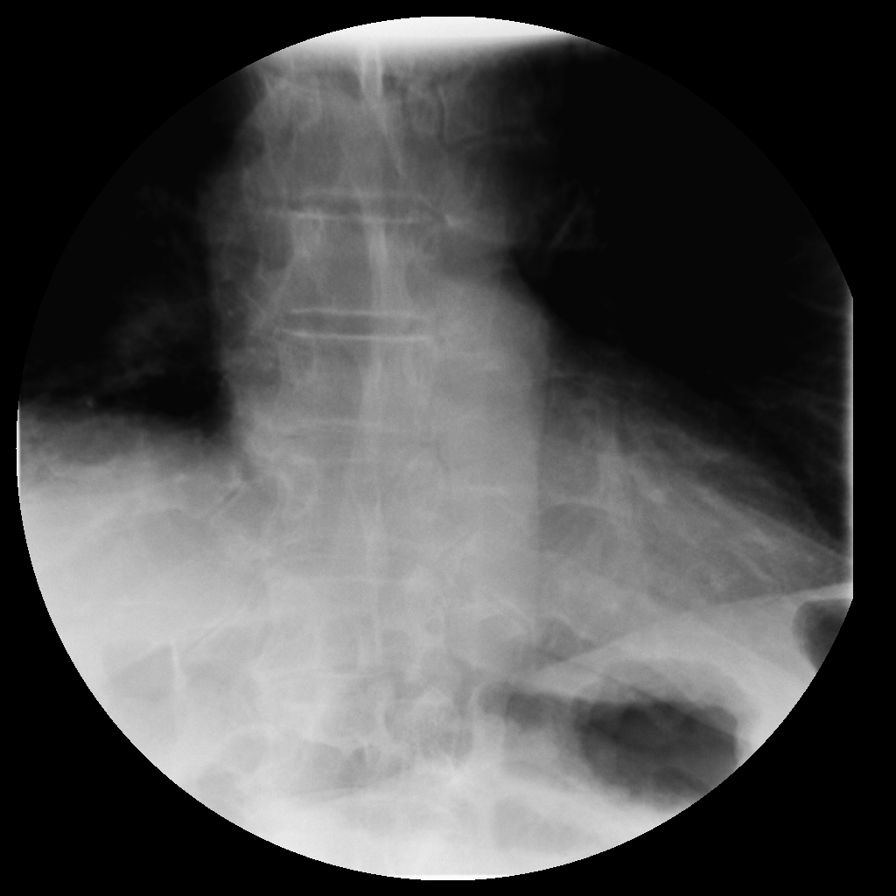
[im 2/2]
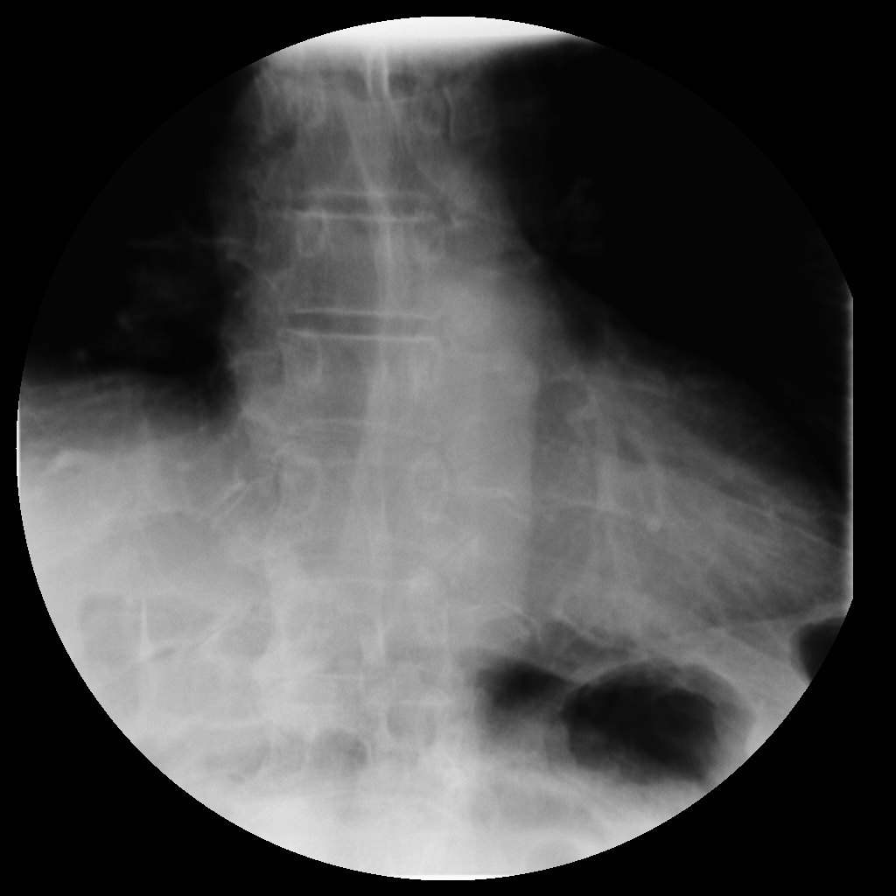

[7 of 7 positions shown; findings below may reference images not displayed]

FINDINGS: There is mild elevation of right hemidiaphragm. With inspiration, there is
mild excursion of the right hemidiaphragm. With the sniff test, there is
paradoxical elevation of the right hemidiaphragm. There is normal motion of
the left hemidiaphragm.
IMPRESSION: Findings concerning for at least partially paralyzed right hemidiaphragm.

## 2013-07-13 DIAGNOSIS — W57XXXA Bitten or stung by nonvenomous insect and other nonvenomous arthropods, initial encounter: Secondary | ICD-10-CM | POA: Diagnosis not present

## 2013-07-13 DIAGNOSIS — T148 Other injury of unspecified body region: Secondary | ICD-10-CM | POA: Diagnosis not present

## 2013-08-03 DIAGNOSIS — T148 Other injury of unspecified body region: Secondary | ICD-10-CM | POA: Diagnosis not present

## 2013-08-03 DIAGNOSIS — I10 Essential (primary) hypertension: Secondary | ICD-10-CM | POA: Diagnosis not present

## 2013-08-03 DIAGNOSIS — N529 Male erectile dysfunction, unspecified: Secondary | ICD-10-CM | POA: Diagnosis not present

## 2013-08-03 DIAGNOSIS — W57XXXA Bitten or stung by nonvenomous insect and other nonvenomous arthropods, initial encounter: Secondary | ICD-10-CM | POA: Diagnosis not present

## 2013-08-03 DIAGNOSIS — E291 Testicular hypofunction: Secondary | ICD-10-CM | POA: Diagnosis not present

## 2013-08-03 DIAGNOSIS — E039 Hypothyroidism, unspecified: Secondary | ICD-10-CM | POA: Diagnosis not present

## 2013-08-03 DIAGNOSIS — M169 Osteoarthritis of hip, unspecified: Secondary | ICD-10-CM | POA: Diagnosis not present

## 2013-08-03 DIAGNOSIS — G609 Hereditary and idiopathic neuropathy, unspecified: Secondary | ICD-10-CM | POA: Diagnosis not present

## 2013-08-03 DIAGNOSIS — M1610 Unilateral primary osteoarthritis, unspecified hip: Secondary | ICD-10-CM | POA: Diagnosis not present

## 2013-08-20 DIAGNOSIS — L723 Sebaceous cyst: Secondary | ICD-10-CM | POA: Diagnosis not present

## 2013-08-20 DIAGNOSIS — H2510 Age-related nuclear cataract, unspecified eye: Secondary | ICD-10-CM | POA: Diagnosis not present

## 2013-08-20 DIAGNOSIS — H25019 Cortical age-related cataract, unspecified eye: Secondary | ICD-10-CM | POA: Diagnosis not present

## 2013-08-20 DIAGNOSIS — H04129 Dry eye syndrome of unspecified lacrimal gland: Secondary | ICD-10-CM | POA: Diagnosis not present

## 2013-08-20 DIAGNOSIS — H40009 Preglaucoma, unspecified, unspecified eye: Secondary | ICD-10-CM | POA: Diagnosis not present

## 2013-08-22 DIAGNOSIS — Z471 Aftercare following joint replacement surgery: Secondary | ICD-10-CM | POA: Diagnosis not present

## 2013-08-22 DIAGNOSIS — Z9660 Presence of unspecified orthopedic joint implant: Secondary | ICD-10-CM | POA: Diagnosis not present

## 2013-08-22 DIAGNOSIS — H40009 Preglaucoma, unspecified, unspecified eye: Secondary | ICD-10-CM | POA: Diagnosis not present

## 2013-09-12 DIAGNOSIS — L723 Sebaceous cyst: Secondary | ICD-10-CM | POA: Diagnosis not present

## 2013-09-12 DIAGNOSIS — L820 Inflamed seborrheic keratosis: Secondary | ICD-10-CM | POA: Diagnosis not present

## 2013-09-12 DIAGNOSIS — L905 Scar conditions and fibrosis of skin: Secondary | ICD-10-CM | POA: Diagnosis not present

## 2013-09-19 DIAGNOSIS — R05 Cough: Secondary | ICD-10-CM | POA: Diagnosis not present

## 2013-09-19 DIAGNOSIS — J986 Disorders of diaphragm: Secondary | ICD-10-CM | POA: Diagnosis not present

## 2013-09-19 DIAGNOSIS — J449 Chronic obstructive pulmonary disease, unspecified: Secondary | ICD-10-CM | POA: Diagnosis not present

## 2013-09-19 DIAGNOSIS — G4733 Obstructive sleep apnea (adult) (pediatric): Secondary | ICD-10-CM | POA: Diagnosis not present

## 2013-09-19 DIAGNOSIS — R059 Cough, unspecified: Secondary | ICD-10-CM | POA: Diagnosis not present

## 2013-09-26 DIAGNOSIS — L080 Pyoderma: Secondary | ICD-10-CM | POA: Diagnosis not present

## 2013-09-26 DIAGNOSIS — L905 Scar conditions and fibrosis of skin: Secondary | ICD-10-CM | POA: Diagnosis not present

## 2013-10-01 IMAGING — CR DG CHEST 2V
2 series · 2 of 2 positions shown · non-contrast
Comparison: None.

CLINICAL DATA: Preop right hip replacement

CHEST - 2 VIEW

[w chest pa]
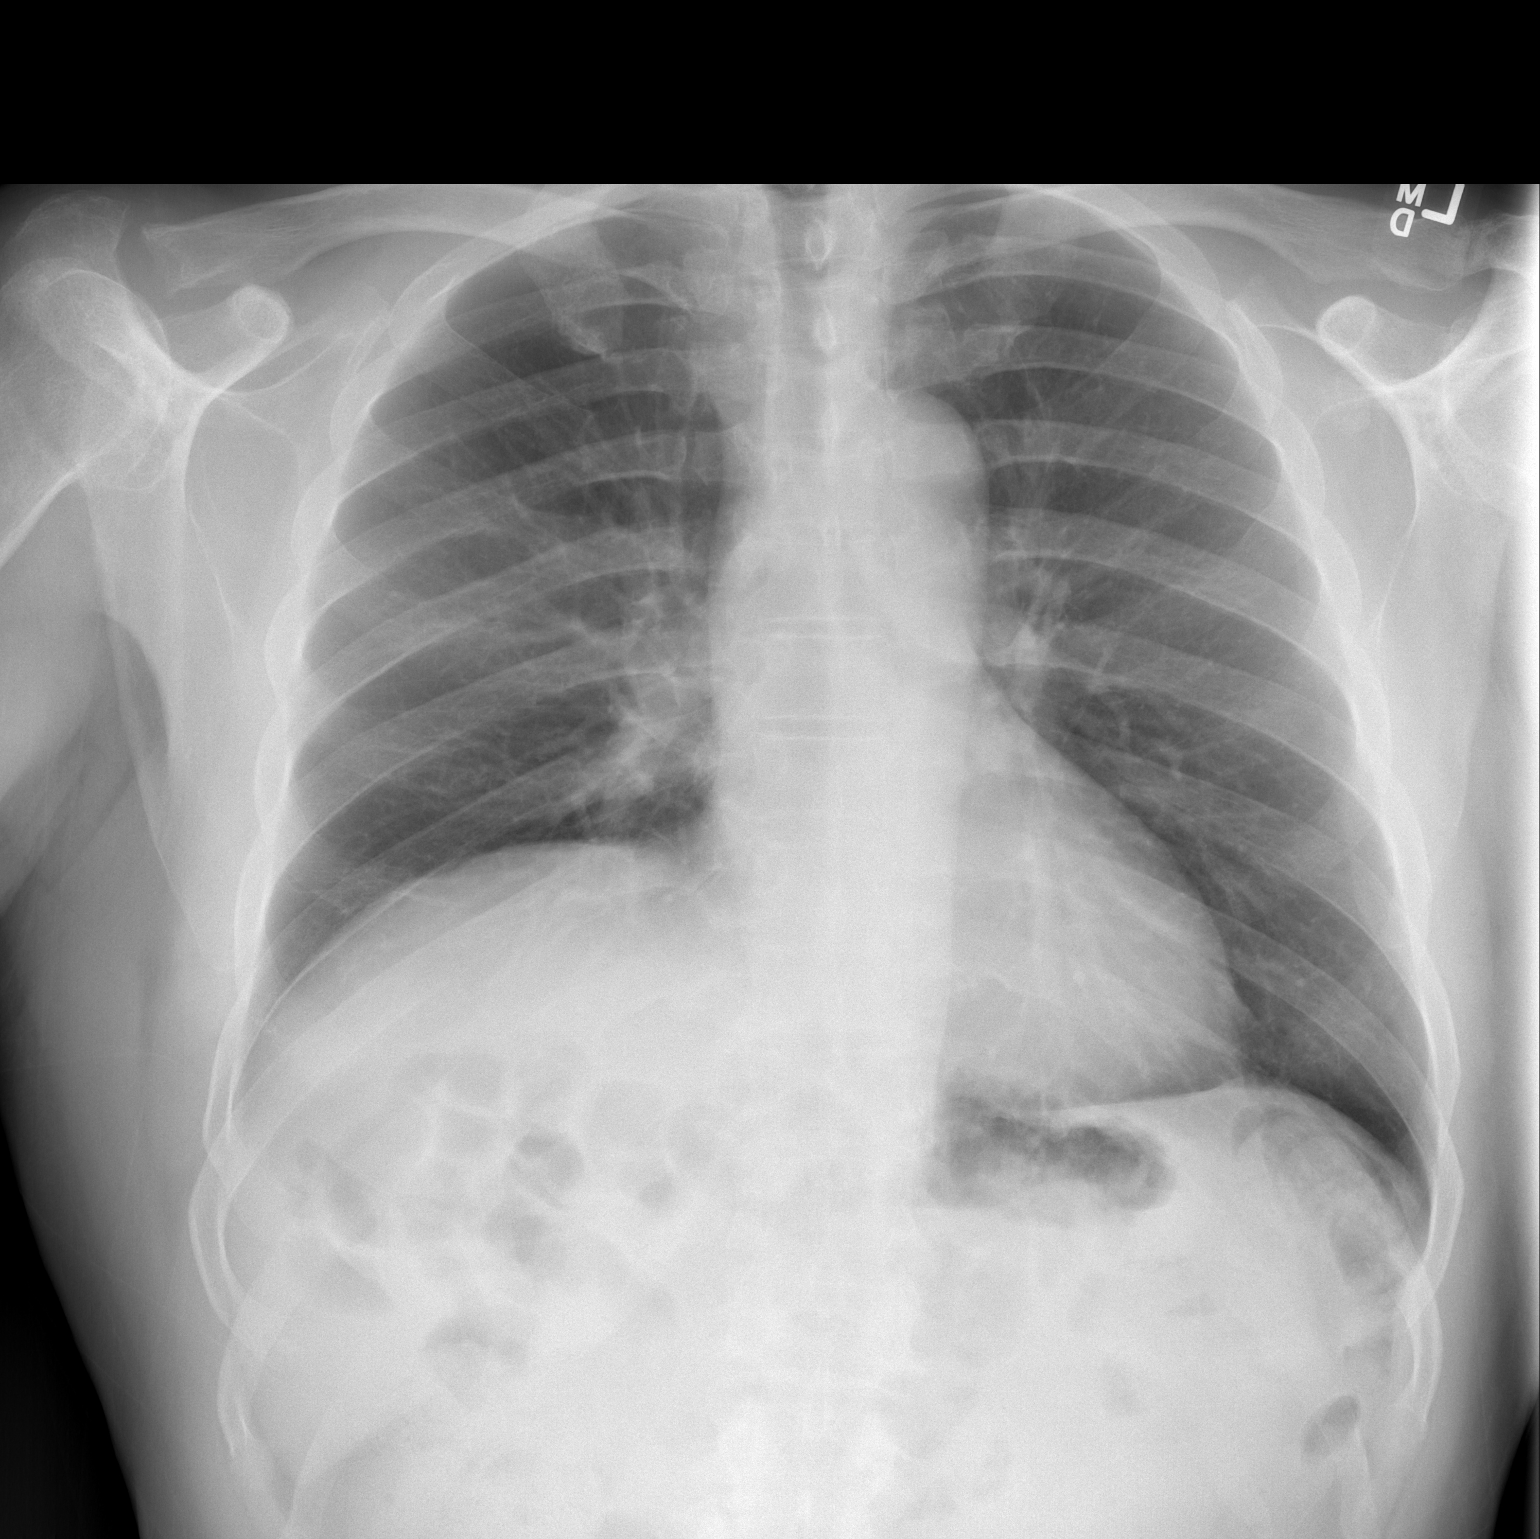

[w chest lat]
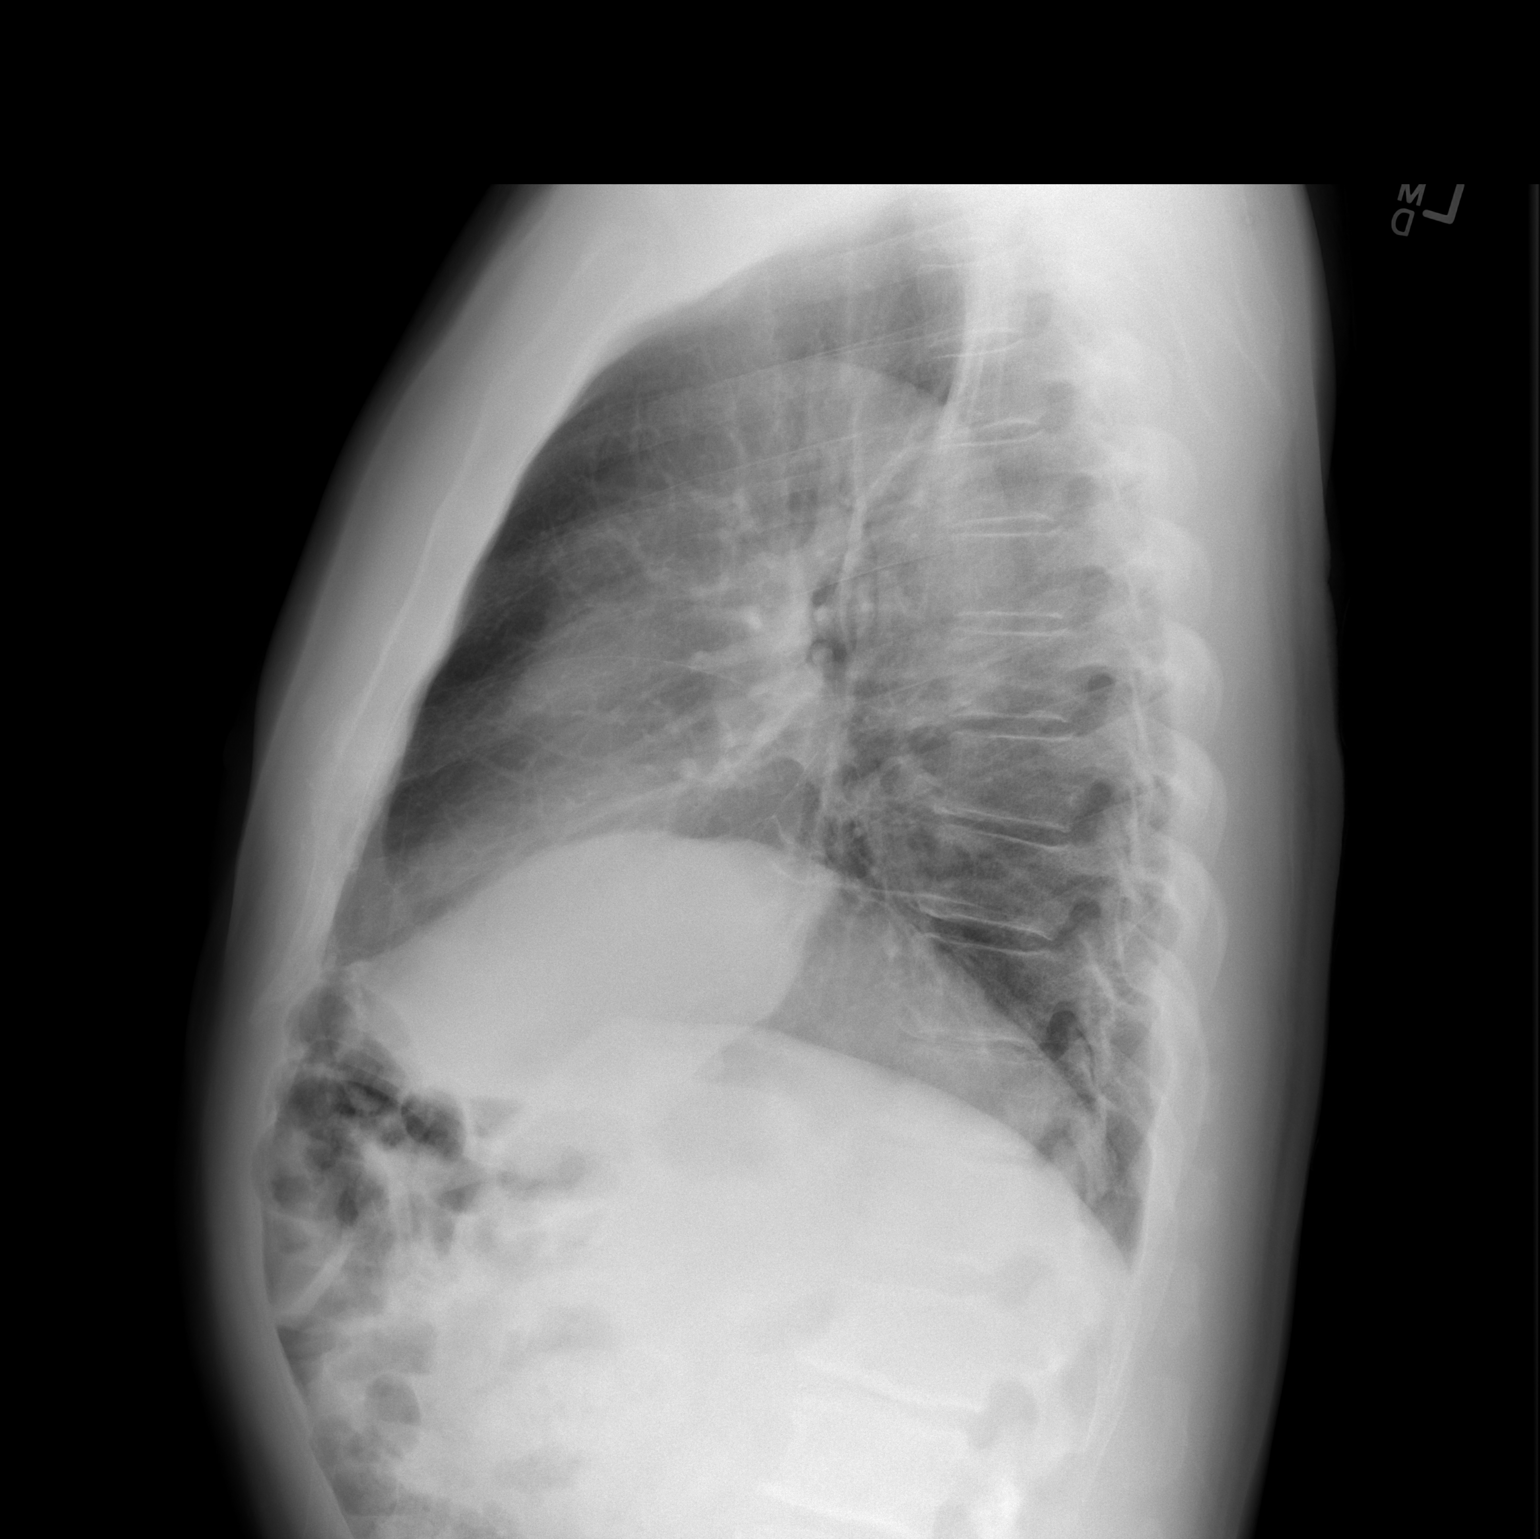

[2 of 2 positions shown; findings below may reference images not displayed]

FINDINGS: Cardiomediastinal silhouette is unremarkable.  No acute
infiltrate or pleural effusion.  No pulmonary edema.  There is
elevation of the right hemidiaphragm.  Bony thorax is unremarkable.
IMPRESSION: No active disease.  Elevation of the right hemidiaphragm.

## 2013-12-10 ENCOUNTER — Ambulatory Visit (INDEPENDENT_AMBULATORY_CARE_PROVIDER_SITE_OTHER): Payer: BC Managed Care – PPO | Admitting: Family Medicine

## 2013-12-10 ENCOUNTER — Ambulatory Visit (INDEPENDENT_AMBULATORY_CARE_PROVIDER_SITE_OTHER): Payer: BC Managed Care – PPO

## 2013-12-10 VITALS — BP 140/90 | HR 85 | Temp 97.9°F | Resp 16 | Ht 69.0 in | Wt 188.0 lb

## 2013-12-10 DIAGNOSIS — S6992XA Unspecified injury of left wrist, hand and finger(s), initial encounter: Secondary | ICD-10-CM

## 2013-12-10 DIAGNOSIS — S59919A Unspecified injury of unspecified forearm, initial encounter: Secondary | ICD-10-CM

## 2013-12-10 DIAGNOSIS — S63509A Unspecified sprain of unspecified wrist, initial encounter: Secondary | ICD-10-CM

## 2013-12-10 DIAGNOSIS — S59909A Unspecified injury of unspecified elbow, initial encounter: Secondary | ICD-10-CM

## 2013-12-10 DIAGNOSIS — S6990XA Unspecified injury of unspecified wrist, hand and finger(s), initial encounter: Secondary | ICD-10-CM

## 2013-12-10 DIAGNOSIS — S63501A Unspecified sprain of right wrist, initial encounter: Secondary | ICD-10-CM

## 2013-12-10 NOTE — Progress Notes (Signed)
Subjective:    Patient ID: Jason Friedman., male    DOB: 14-Oct-1940, 73 y.o.   MRN: [id number]  This chart was scribed for Dr. Norberto Sorenson, MD by Jarvis Morgan, ED Scribe. This patient was seen in Room 1 and the patient's care was started at 6:28 PM.  Chief Complaint  Patient presents with  . Wrist Pain    left x 6 days ago     HPI HPI Comments: Jason Friedman. is a 73 y.o. male who presents to the [facility] complaining of an injury to his left wrist onset 6 days ago .Pt states he was carrying loaves of bread and  he tripped on the concrete block extending from the sidewalk and fell forward and landed on his left wrist. He denies any head injury or LOC from the fall. Pt states he is having associated constant moderate left wrist pain and swelling. He iced and took Ibuprofen immediately after injury with minimal relief. States pain is exacerbated by movement. He denies any numbness, weakness, or bruising to the left wrist.  Past Medical History  Diagnosis Date  . Impaired memory     x 2 yrs ago after neck injury  . Arthritis   . Hypothyroidism   . Prostate irregularity     nonfuctional prostate, on testerone gel bid  . Cervical vertebral fracture 25 yrs ago    C 5   . Fracture of cervical vertebrae, multiple  in high school    2 cervical vertebrae fx, 3 dislocated cervical vertebrae  . Heart abnormality     heart anatomicall rotated, causes abnormal ekg  . Hypertension     hx on, none current  . PONV (postoperative nausea and vomiting)     likes scopolamine patch  . COPD (chronic obstructive pulmonary disease)     mild copd per lov note dr Meredeth Ide 04-16-11 on chart  . Sleep apnea     does not use due to weight loss, does not need now  . Anemia   . 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 levothyroxine (SYNTHROID, LEVOTHROID)  125 MCG tablet Take 125 mcg by mouth daily with breakfast.      . liothyronine (CYTOMEL) 25 MCG tablet Take 25 mcg by mouth daily with breakfast.      . rivaroxaban (XARELTO) 10 MG TABS tablet Take 1 tablet (10 mg total) by mouth daily with breakfast. Take for two and a half more weeks, then discontinue Xarelto.  19 tablet  0  . zolpidem (AMBIEN CR) 12.5 MG CR tablet Take 12.5 mg by mouth at bedtime as needed. For sleep        No current facility-administered medications on file prior to visit.   Allergies  Allergen Reactions  . Cefuroxime Axetil [Ceftin] Other (See Comments)    Joint pain    Review of Systems  Musculoskeletal: Positive for arthralgias (left wrist), joint swelling (left wrist) and myalgias.  Skin: Negative for color change.  Neurological: Negative for syncope, weakness, numbness and headaches.   Vitals: BP 140/90  Pulse 85  Temp(Src) 97.9 F (36.6 C) (Oral)  Resp 16  Ht 5\' 9"  (1.753 m)  Wt 188 lb (85.276 kg)  BMI 27.75 kg/m2  SpO2 96%    Objective:   Physical Exam  Nursing note and vitals reviewed. Constitutional: He  is oriented to person, place, and time. He appears well-developed and well-nourished. No distress.  HENT:  Head: Normocephalic and atraumatic.  Eyes: Conjunctivae and EOM are normal.  Neck: Neck supple. No tracheal deviation present.  Cardiovascular: Normal rate, regular rhythm and normal heart sounds.   Pulses:      Radial pulses are 2+ on the right side, and 2+ on the left side.  2+ ulnar pulses.  Pulmonary/Chest: Effort normal and breath sounds normal. No respiratory distress. He has no wheezes. He has no rales.  Abdominal: Soft. Bowel sounds are normal. He exhibits no mass. There is no tenderness. There is no rebound and no guarding.  Musculoskeletal:       Right wrist: Normal.       Left wrist: He exhibits decreased range of motion, tenderness and swelling.  Slightly restricted on supination of left wrist, full pronation. Pain over  anatomic snuff box. Swelling over dorsum of wrist and carpals.  Decreased flexion and no extension. Minimal pain over ulnar head  Neurological: He is alert and oriented to person, place, and time.  Skin: Skin is warm and dry.  Psychiatric: He has a normal mood and affect. His behavior is normal.    [facility] reading (PRIMARY) by  Dr. Clelia CroftShaw Left wrist X-ray shows no acute abnormality to explain anatomic snuff box pain. Question some arthritic degradation on the ulnar aspect of trapezium    Assessment & Plan:  Left wrist injury, initial encounter - Plan: DG Wrist Complete Left  Wrist sprain, right, initial encounter   I personally performed the services described in this documentation, which was scribed in my presence. The recorded information has been reviewed and considered, and addended by me as needed.  Norberto SorensonEva Dani Wallner, MD MPH

## 2013-12-10 NOTE — Patient Instructions (Signed)
RICE:  R: Rest is achieved by limiting weightbearing. No lifting with your hand until you can do it without any pain. I:   Ice or cold water immersion for 15 to 20 minutes every two to three hours for the first 48 hours or until swelling is improved, whichever comes first. C: Compression with an elastic bandage or with the brace provided in clinic to minimize swelling should be applied early. E: Elevation - The injured wrist should be kept elevated above the level of the heart to further alleviate swelling.  Nonsteroidal antiinflammatory drugs (NSAIDs) can be used; no particular NSAID has been shown to be superior so you can using whatever you have at home - ibuprofen, aleve, motrin, advil, etc.    Exercises the wrist working on your maintaining the joint flexibility and motion should be started early, once acute pain and swelling subside, to maintain range of motion. The intensity of rehabilitation is increased gradually.  Wear the brace for two weeks to protect against overuse and re-injury.  Recheck with [place] in 2 weeks to ensure you are healing as expected.  Wrist Sprain with Rehab A sprain is an injury in which a ligament that maintains the proper alignment of a joint is partially or completely torn. The ligaments of the wrist are susceptible to sprains. Sprains are classified into three categories. Grade 1 sprains cause pain, but the tendon is not lengthened. Grade 2 sprains include a lengthened ligament because the ligament is stretched or partially ruptured. With grade 2 sprains there is still function, although the function may be diminished. Grade 3 sprains are characterized by a complete tear of the tendon or muscle, and function is usually impaired. SYMPTOMS   Pain tenderness, inflammation, and/or bruising (contusion) of the injury.  A "pop" or tear felt and/or heard at the time of injury.  Decreased wrist function. CAUSES  A wrist sprain occurs when a force is placed on one or more  ligaments that is greater than it/they can withstand. Common mechanisms of injury include:  Catching a ball with you hands.  Repetitive and/ or strenuous extension or flexion of the wrist. RISK INCREASES WITH:  Previous wrist injury.  Contact sports (boxing or wrestling).  Activities in which falling is common.  Poor strength and flexibility.  Improperly fitted or padded protective equipment. PREVENTION  Warm up and stretch properly before activity.  Allow for adequate recovery between workouts.  Maintain physical fitness:  Strength, flexibility, and endurance.  Cardiovascular fitness.  Protect the wrist joint by limiting its motion with the use of taping, braces, or splints.  Protect the wrist after injury for 6 to 12 months. PROGNOSIS  The prognosis for wrist sprains depends on the degree of injury. Grade 1 sprains require 2 to 6 weeks of treatment. Grade 2 sprains require 6 to 8 weeks of treatment, and grade 3 sprains require up to 12 weeks.  RELATED COMPLICATIONS   Prolonged healing time, if improperly treated or re-injured.  Recurrent symptoms that result in a chronic problem.  Injury to nearby structures (bone, cartilage, nerves, or tendons).  Arthritis of the wrist.  Inability to compete in athletics at a high level.  Wrist stiffness or weakness.  Progression to a complete rupture of the ligament. TREATMENT  Treatment initially involves resting from any activities that aggravate the symptoms, and the use of ice and medications to help reduce pain and inflammation. Your caregiver may recommend immobilizing the wrist for a period of time in order to reduce stress  on the ligament and allow for healing. After immobilization it is important to perform strengthening and stretching exercises to help regain strength and a full range of motion. These exercises may be completed at home or with a therapist. Surgery is not usually required for wrist sprains, unless the  ligament has been ruptured (grade 3 sprain). MEDICATION   If pain medication is necessary, then nonsteroidal anti-inflammatory medications, such as aspirin and ibuprofen, or other minor pain relievers, such as acetaminophen, are often recommended.  Do not take pain medication for 7 days before surgery.  Prescription pain relievers may be given if deemed necessary by your caregiver. Use only as directed and only as much as you need. HEAT AND COLD  Cold treatment (icing) relieves pain and reduces inflammation. Cold treatment should be applied for 10 to 15 minutes every 2 to 3 hours for inflammation and pain and immediately after any activity that aggravates your symptoms. Use ice packs or massage the area with a piece of ice (ice massage).  Heat treatment may be used prior to performing the stretching and strengthening activities prescribed by your caregiver, physical therapist, or athletic trainer. Use a heat pack or soak your injury in warm water. SEEK MEDICAL CARE IF:  Treatment seems to offer no benefit, or the condition worsens.  Any medications produce adverse side effects. EXERCISES RANGE OF MOTION (ROM) AND STRETCHING EXERCISES - Wrist Sprain  These exercises may help you when beginning to rehabilitate your injury. Your symptoms may resolve with or without further involvement from your physician, physical therapist or athletic trainer. While completing these exercises, remember:   Restoring tissue flexibility helps normal motion to return to the joints. This allows healthier, less painful movement and activity.  An effective stretch should be held for at least 30 seconds.  A stretch should never be painful. You should only feel a gentle lengthening or release in the stretched tissue. RANGE OF MOTION - Wrist Flexion, Active-Assisted  Extend your right / left elbow with your fingers pointing down.*  Gently pull the back of your hand towards you until you feel a gentle stretch on  the top of your forearm.  Hold this position for __________ seconds. Repeat __________ times. Complete this exercise __________ times per day.  *If directed by your physician, physical therapist or athletic trainer, complete this stretch with your elbow bent rather than extended. RANGE OF MOTION - Wrist Extension, Active-Assisted  Extend your right / left elbow and turn your palm upwards.*  Gently pull your palm/fingertips back so your wrist extends and your fingers point more toward the ground.  You should feel a gentle stretch on the inside of your forearm.  Hold this position for __________ seconds. Repeat __________ times. Complete this exercise __________ times per day. *If directed by your physician, physical therapist or athletic trainer, complete this stretch with your elbow bent, rather than extended. RANGE OF MOTION - Supination, Active  Stand or sit with your elbows at your side. Bend your right / left elbow to 90 degrees.  Turn your palm upward until you feel a gentle stretch on the inside of your forearm.  Hold this position for __________ seconds. Slowly release and return to the starting position. Repeat __________ times. Complete this stretch __________ times per day.  RANGE OF MOTION - Pronation, Active  Stand or sit with your elbows at your side. Bend your right / left elbow to 90 degrees.  Turn your palm downward until you feel a gentle stretch on  the top of your forearm.  Hold this position for __________ seconds. Slowly release and return to the starting position. Repeat __________ times. Complete this stretch __________ times per day.  STRETCH - Wrist Flexion  Place the back of your right / left hand on a tabletop leaving your elbow slightly bent. Your fingers should point away from your body.  Gently press the back of your hand down onto the table by straightening your elbow. You should feel a stretch on the top of your forearm.  Hold this position for  __________ seconds. Repeat __________ times. Complete this stretch __________ times per day.  STRETCH - Wrist Extension  Place your right / left fingertips on a tabletop leaving your elbow slightly bent. Your fingers should point backwards.  Gently press your fingers and palm down onto the table by straightening your elbow. You should feel a stretch on the inside of your forearm.  Hold this position for __________ seconds. Repeat __________ times. Complete this stretch __________ times per day.  STRENGTHENING EXERCISES - Wrist Sprain These exercises may help you when beginning to rehabilitate your injury. They may resolve your symptoms with or without further involvement from your physician, physical therapist or athletic trainer. While completing these exercises, remember:   Muscles can gain both the endurance and the strength needed for everyday activities through controlled exercises.  Complete these exercises as instructed by your physician, physical therapist or athletic trainer. Progress with the resistance and repetition exercises only as your caregiver advises. STRENGTH - Wrist Flexors  Sit with your right / left forearm palm-up and fully supported. Your elbow should be resting below the height of your shoulder. Allow your wrist to extend over the edge of the surface.  Loosely holding a __________ weight or a piece of rubber exercise band/tubing, slowly curl your hand up toward your forearm.  Hold this position for __________ seconds. Slowly lower the wrist back to the starting position in a controlled manner. Repeat __________ times. Complete this exercise __________ times per day.  STRENGTH - Wrist Extensors  Sit with your right / left forearm palm-down and fully supported. Your elbow should be resting below the height of your shoulder. Allow your wrist to extend over the edge of the surface.  Loosely holding a __________ weight or a piece of rubber exercise band/tubing, slowly  curl your hand up toward your forearm.  Hold this position for __________ seconds. Slowly lower the wrist back to the starting position in a controlled manner. Repeat __________ times. Complete this exercise __________ times per day.  STRENGTH - Ulnar Deviators  Stand with a ____________________ weight in your right / left hand, or sit holding on to the rubber exercise band/tubing with your opposite arm supported.  Move your wrist so that your pinkie travels toward your forearm and your thumb moves away from your forearm.  Hold this position for __________ seconds and then slowly lower the wrist back to the starting position. Repeat __________ times. Complete this exercise __________ times per day STRENGTH - Radial Deviators  Stand with a ____________________ weight in your  right / left hand, or sit holding on to the rubber exercise band/tubing with your arm supported.  Raise your hand upward in front of you or pull up on the rubber tubing.  Hold this position for __________ seconds and then slowly lower the wrist back to the starting position. Repeat __________ times. Complete this exercise __________ times per day. STRENGTH - Forearm Supinators  Sit with your right /  left forearm supported on a table, keeping your elbow below shoulder height. Rest your hand over the edge, palm down.  Gently grip a hammer or a soup ladle.  Without moving your elbow, slowly turn your palm and hand upward to a "thumbs-up" position.  Hold this position for __________ seconds. Slowly return to the starting position. Repeat __________ times. Complete this exercise __________ times per day.  STRENGTH - Forearm Pronators  Sit with your right / left forearm supported on a table, keeping your elbow below shoulder height. Rest your hand over the edge, palm up.  Gently grip a hammer or a soup ladle.  Without moving your elbow, slowly turn your palm and hand upward to a "thumbs-up" position.  Hold this  position for __________ seconds. Slowly return to the starting position. Repeat __________ times. Complete this exercise __________ times per day.  STRENGTH - Grip  Grasp a tennis ball, a dense sponge, or a large, rolled sock in your hand.  Squeeze as hard as you can without increasing any pain.  Hold this position for __________ seconds. Release your grip slowly. Repeat __________ times. Complete this exercise __________ times per day.  Document Released: 04/12/2005 Document Revised: 07/05/2011 Document Reviewed: 07/25/2008 [facility] Patient Information 2015 [place], [place]. This information is not intended to replace advice given to you by your health care provider. Make sure you discuss any questions you have with your health care provider.

## 2014-01-03 DIAGNOSIS — N200 Calculus of kidney: Secondary | ICD-10-CM | POA: Diagnosis not present

## 2014-01-03 DIAGNOSIS — R809 Proteinuria, unspecified: Secondary | ICD-10-CM | POA: Diagnosis not present

## 2014-01-03 DIAGNOSIS — I10 Essential (primary) hypertension: Secondary | ICD-10-CM | POA: Diagnosis not present

## 2014-01-03 DIAGNOSIS — E039 Hypothyroidism, unspecified: Secondary | ICD-10-CM | POA: Diagnosis not present

## 2014-01-17 DIAGNOSIS — I10 Essential (primary) hypertension: Secondary | ICD-10-CM | POA: Diagnosis not present

## 2014-02-15 DIAGNOSIS — Z0000 Encounter for general adult medical examination without abnormal findings: Secondary | ICD-10-CM | POA: Diagnosis not present

## 2014-02-15 DIAGNOSIS — Z23 Encounter for immunization: Secondary | ICD-10-CM | POA: Diagnosis not present

## 2014-02-15 DIAGNOSIS — E291 Testicular hypofunction: Secondary | ICD-10-CM | POA: Diagnosis not present

## 2014-02-15 DIAGNOSIS — N5201 Erectile dysfunction due to arterial insufficiency: Secondary | ICD-10-CM | POA: Diagnosis not present

## 2014-02-15 DIAGNOSIS — Z79899 Other long term (current) drug therapy: Secondary | ICD-10-CM | POA: Diagnosis not present

## 2014-02-15 DIAGNOSIS — I10 Essential (primary) hypertension: Secondary | ICD-10-CM | POA: Diagnosis not present

## 2014-02-15 DIAGNOSIS — G609 Hereditary and idiopathic neuropathy, unspecified: Secondary | ICD-10-CM | POA: Diagnosis not present

## 2014-02-15 DIAGNOSIS — E039 Hypothyroidism, unspecified: Secondary | ICD-10-CM | POA: Diagnosis not present

## 2014-02-15 DIAGNOSIS — M169 Osteoarthritis of hip, unspecified: Secondary | ICD-10-CM | POA: Diagnosis not present

## 2014-03-29 DIAGNOSIS — I10 Essential (primary) hypertension: Secondary | ICD-10-CM | POA: Diagnosis not present

## 2014-03-29 DIAGNOSIS — E785 Hyperlipidemia, unspecified: Secondary | ICD-10-CM | POA: Diagnosis not present

## 2014-03-29 DIAGNOSIS — N200 Calculus of kidney: Secondary | ICD-10-CM | POA: Diagnosis not present

## 2014-03-29 DIAGNOSIS — R809 Proteinuria, unspecified: Secondary | ICD-10-CM | POA: Diagnosis not present

## 2014-04-24 DIAGNOSIS — J449 Chronic obstructive pulmonary disease, unspecified: Secondary | ICD-10-CM | POA: Diagnosis not present

## 2014-04-24 DIAGNOSIS — R0602 Shortness of breath: Secondary | ICD-10-CM | POA: Diagnosis not present

## 2014-08-20 DIAGNOSIS — I10 Essential (primary) hypertension: Secondary | ICD-10-CM | POA: Diagnosis not present

## 2014-08-20 DIAGNOSIS — Z79899 Other long term (current) drug therapy: Secondary | ICD-10-CM | POA: Diagnosis not present

## 2014-08-20 DIAGNOSIS — M169 Osteoarthritis of hip, unspecified: Secondary | ICD-10-CM | POA: Diagnosis not present

## 2014-08-20 DIAGNOSIS — G609 Hereditary and idiopathic neuropathy, unspecified: Secondary | ICD-10-CM | POA: Diagnosis not present

## 2014-08-20 DIAGNOSIS — N5201 Erectile dysfunction due to arterial insufficiency: Secondary | ICD-10-CM | POA: Diagnosis not present

## 2014-08-20 DIAGNOSIS — D7589 Other specified diseases of blood and blood-forming organs: Secondary | ICD-10-CM | POA: Diagnosis not present

## 2014-08-20 DIAGNOSIS — E039 Hypothyroidism, unspecified: Secondary | ICD-10-CM | POA: Diagnosis not present

## 2014-08-20 DIAGNOSIS — E291 Testicular hypofunction: Secondary | ICD-10-CM | POA: Diagnosis not present

## 2014-08-23 DIAGNOSIS — H40013 Open angle with borderline findings, low risk, bilateral: Secondary | ICD-10-CM | POA: Diagnosis not present

## 2014-09-27 DIAGNOSIS — H2513 Age-related nuclear cataract, bilateral: Secondary | ICD-10-CM | POA: Diagnosis not present

## 2014-09-27 DIAGNOSIS — H4011X2 Primary open-angle glaucoma, moderate stage: Secondary | ICD-10-CM | POA: Diagnosis not present

## 2014-09-27 DIAGNOSIS — H11153 Pinguecula, bilateral: Secondary | ICD-10-CM | POA: Diagnosis not present

## 2014-10-04 DIAGNOSIS — H4011X2 Primary open-angle glaucoma, moderate stage: Secondary | ICD-10-CM | POA: Diagnosis not present

## 2014-11-15 DIAGNOSIS — H401221 Low-tension glaucoma, left eye, mild stage: Secondary | ICD-10-CM | POA: Diagnosis not present

## 2014-11-22 DIAGNOSIS — G609 Hereditary and idiopathic neuropathy, unspecified: Secondary | ICD-10-CM | POA: Diagnosis not present

## 2014-11-22 DIAGNOSIS — I10 Essential (primary) hypertension: Secondary | ICD-10-CM | POA: Diagnosis not present

## 2014-11-22 DIAGNOSIS — E039 Hypothyroidism, unspecified: Secondary | ICD-10-CM | POA: Diagnosis not present

## 2014-11-22 DIAGNOSIS — M169 Osteoarthritis of hip, unspecified: Secondary | ICD-10-CM | POA: Diagnosis not present

## 2014-11-22 DIAGNOSIS — E291 Testicular hypofunction: Secondary | ICD-10-CM | POA: Diagnosis not present

## 2014-11-22 DIAGNOSIS — Z79899 Other long term (current) drug therapy: Secondary | ICD-10-CM | POA: Diagnosis not present

## 2014-11-22 DIAGNOSIS — H4011X2 Primary open-angle glaucoma, moderate stage: Secondary | ICD-10-CM | POA: Diagnosis not present

## 2014-11-22 DIAGNOSIS — N5201 Erectile dysfunction due to arterial insufficiency: Secondary | ICD-10-CM | POA: Diagnosis not present

## 2014-12-04 DIAGNOSIS — R05 Cough: Secondary | ICD-10-CM | POA: Diagnosis not present

## 2014-12-04 DIAGNOSIS — J449 Chronic obstructive pulmonary disease, unspecified: Secondary | ICD-10-CM | POA: Diagnosis not present

## 2015-01-17 DIAGNOSIS — J069 Acute upper respiratory infection, unspecified: Secondary | ICD-10-CM | POA: Diagnosis not present

## 2015-01-17 DIAGNOSIS — J9801 Acute bronchospasm: Secondary | ICD-10-CM | POA: Diagnosis not present

## 2015-01-17 DIAGNOSIS — R05 Cough: Secondary | ICD-10-CM | POA: Diagnosis not present

## 2015-01-30 DIAGNOSIS — I10 Essential (primary) hypertension: Secondary | ICD-10-CM | POA: Diagnosis not present

## 2015-01-30 DIAGNOSIS — E039 Hypothyroidism, unspecified: Secondary | ICD-10-CM | POA: Diagnosis not present

## 2015-01-30 DIAGNOSIS — R809 Proteinuria, unspecified: Secondary | ICD-10-CM | POA: Diagnosis not present

## 2015-01-30 DIAGNOSIS — G4730 Sleep apnea, unspecified: Secondary | ICD-10-CM | POA: Diagnosis not present

## 2015-01-30 DIAGNOSIS — E875 Hyperkalemia: Secondary | ICD-10-CM | POA: Diagnosis not present

## 2015-01-30 DIAGNOSIS — N200 Calculus of kidney: Secondary | ICD-10-CM | POA: Diagnosis not present

## 2015-01-30 DIAGNOSIS — E785 Hyperlipidemia, unspecified: Secondary | ICD-10-CM | POA: Diagnosis not present

## 2015-02-09 DIAGNOSIS — Z23 Encounter for immunization: Secondary | ICD-10-CM | POA: Diagnosis not present

## 2015-02-20 DIAGNOSIS — I10 Essential (primary) hypertension: Secondary | ICD-10-CM | POA: Diagnosis not present

## 2015-02-20 DIAGNOSIS — H401221 Low-tension glaucoma, left eye, mild stage: Secondary | ICD-10-CM | POA: Diagnosis not present

## 2015-03-14 DIAGNOSIS — Z79899 Other long term (current) drug therapy: Secondary | ICD-10-CM | POA: Diagnosis not present

## 2015-03-14 DIAGNOSIS — E291 Testicular hypofunction: Secondary | ICD-10-CM | POA: Diagnosis not present

## 2015-03-14 DIAGNOSIS — E039 Hypothyroidism, unspecified: Secondary | ICD-10-CM | POA: Diagnosis not present

## 2015-03-14 DIAGNOSIS — Z0000 Encounter for general adult medical examination without abnormal findings: Secondary | ICD-10-CM | POA: Diagnosis not present

## 2015-03-14 DIAGNOSIS — N5201 Erectile dysfunction due to arterial insufficiency: Secondary | ICD-10-CM | POA: Diagnosis not present

## 2015-03-14 DIAGNOSIS — I10 Essential (primary) hypertension: Secondary | ICD-10-CM | POA: Diagnosis not present

## 2015-03-14 DIAGNOSIS — Z1389 Encounter for screening for other disorder: Secondary | ICD-10-CM | POA: Diagnosis not present

## 2015-03-14 DIAGNOSIS — Z125 Encounter for screening for malignant neoplasm of prostate: Secondary | ICD-10-CM | POA: Diagnosis not present

## 2015-03-14 DIAGNOSIS — G609 Hereditary and idiopathic neuropathy, unspecified: Secondary | ICD-10-CM | POA: Diagnosis not present

## 2015-03-14 DIAGNOSIS — M169 Osteoarthritis of hip, unspecified: Secondary | ICD-10-CM | POA: Diagnosis not present

## 2015-05-08 DIAGNOSIS — R202 Paresthesia of skin: Secondary | ICD-10-CM | POA: Diagnosis not present

## 2015-05-08 DIAGNOSIS — M5416 Radiculopathy, lumbar region: Secondary | ICD-10-CM | POA: Diagnosis not present

## 2015-05-15 ENCOUNTER — Encounter: Payer: Self-pay | Admitting: Neurology

## 2015-05-15 ENCOUNTER — Ambulatory Visit (INDEPENDENT_AMBULATORY_CARE_PROVIDER_SITE_OTHER): Payer: BC Managed Care – PPO | Admitting: Neurology

## 2015-05-15 VITALS — BP 145/77 | HR 70 | Resp 16 | Ht 66.0 in | Wt 182.0 lb

## 2015-05-15 DIAGNOSIS — R202 Paresthesia of skin: Secondary | ICD-10-CM | POA: Diagnosis not present

## 2015-05-15 DIAGNOSIS — R200 Anesthesia of skin: Secondary | ICD-10-CM

## 2015-05-15 NOTE — Patient Instructions (Signed)
Your exam shows some signs of neuropathy. I would like to investigate things further to look for evidence of neuropathy or nerve damage; therefore, I would like to do some blood work, and electrical testing of your muscles and nerves, which is known as EMG/NCV. Neuropathy or nerve disease or damage can be caused by a variety of causes, most commonly diabetes, some toxins including alcohol or metabolic derangements or hereditary disorders.  We will call you with the results and keep an eye on your symptoms.  Stay well hydrated with water.  We will also do a lower spine MRI and call you with the test results. We will have to schedule you for this on a separate date. This test requires authorization from your insurance, and we will take care of the insurance process.
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
## 2015-05-19 ENCOUNTER — Telehealth: Payer: Self-pay

## 2015-05-19 LAB — MULTIPLE MYELOMA PANEL, SERUM
ALBUMIN/GLOB SERPL: 1.4 (ref 0.7–1.7)
ALPHA 1: 0.3 g/dL (ref 0.0–0.4)
ALPHA2 GLOB SERPL ELPH-MCNC: 0.6 g/dL (ref 0.4–1.0)
Albumin SerPl Elph-Mcnc: 3.7 g/dL (ref 2.9–4.4)
B-GLOBULIN SERPL ELPH-MCNC: 0.9 g/dL (ref 0.7–1.3)
GAMMA GLOB SERPL ELPH-MCNC: 0.9 g/dL (ref 0.4–1.8)
GLOBULIN, TOTAL: 2.7 g/dL (ref 2.2–3.9)
IGG (IMMUNOGLOBIN G), SERUM: 724 mg/dL (ref 700–1600)
IgA/Immunoglobulin A, Serum: 170 mg/dL (ref 61–437)
IgM (Immunoglobulin M), Srm: 87 mg/dL (ref 15–143)
Total Protein: 6.4 g/dL (ref 6.0–8.5)

## 2015-05-19 LAB — HTLV I+II ANTIBODIES, (EIA), BLD: HTLV I/II Ab: NEGATIVE

## 2015-05-19 LAB — RPR: RPR Ser Ql: NONREACTIVE

## 2015-05-19 LAB — CK: CK TOTAL: 169 U/L (ref 24–204)

## 2015-05-19 LAB — VITAMIN D 25 HYDROXY (VIT D DEFICIENCY, FRACTURES): VIT D 25 HYDROXY: 30 ng/mL (ref 30.0–100.0)

## 2015-05-19 LAB — RHEUMATOID FACTOR

## 2015-05-19 LAB — C-REACTIVE PROTEIN: CRP: 0.5 mg/L (ref 0.0–4.9)

## 2015-05-19 LAB — ANA W/REFLEX: ANA: NEGATIVE

## 2015-05-19 LAB — VITAMIN B1: Thiamine: 133.5 nmol/L (ref 66.5–200.0)

## 2015-05-19 NOTE — Progress Notes (Signed)
Quick Note:  Please call and advise the patient that the recent labs we checked were within normal limits. Vitamin D level was borderline at 30 (normal range is around 30-100). It may be reasonable for him to start an OTC Vitamin D supplement: 1000-2000 units daily of any vitamin D supplement of His choice should be fine. I would recommend recheck of vitamin D status in 3-6 months with PCP.   No other action is required on these tests at this time. Please remind patient to keep any upcoming appointments or tests and to call us with any interim questions, concerns, problems or updates. Thanks,  Huston Foley, MD, PhD    ______

## 2015-05-19 NOTE — Telephone Encounter (Signed)
I spoke to patient and he is aware of results and recommendations. Voiced understanding.  

## 2015-05-19 NOTE — Telephone Encounter (Signed)
-----   Message from Huston Foley, MD sent at 05/19/2015  9:04 AM EST ----- Please call and advise the patient that the recent labs we checked were within normal limits. Vitamin D level was borderline at 30 (normal range is around 30-100). It may be reasonable for him to start an OTC Vitamin D supplement: 1000-2000 units daily of any vitamin D supplement of His choice should be fine. I would recommend recheck of vitamin D status in 3-6 months with PCP.   No other action is required on these tests at this time. Please remind patient to keep any upcoming appointments or tests and to call us with any interim questions, concerns, problems or updates. Thanks,  Huston Foley, MD, PhD

## 2015-05-22 ENCOUNTER — Ambulatory Visit
Admission: RE | Admit: 2015-05-22 | Discharge: 2015-05-22 | Disposition: A | Discharge status: Home / Self Care | Payer: BC Managed Care – PPO | Source: Ambulatory Visit | Attending: Neurology | Admitting: Neurology

## 2015-05-22 DIAGNOSIS — R202 Paresthesia of skin: Principal | ICD-10-CM

## 2015-05-22 DIAGNOSIS — R200 Anesthesia of skin: Secondary | ICD-10-CM

## 2015-05-23 NOTE — Progress Notes (Signed)
Quick Note:  Please call patient regarding his recent lumbar spine MRI. The results indicate fairly severe multilevel degenerative changes, for which I would like for him to see a spine specialist. Some of his symptoms may be related to degenerative back disease. If he has a preference, we can make a referral to a back surgeon of this choice, otherwise we can certainly make a referral from our end if he is agreeable. Huston Foley, MD, PhD [facility]  ______

## 2015-05-26 ENCOUNTER — Telehealth: Payer: Self-pay

## 2015-05-26 DIAGNOSIS — R937 Abnormal findings on diagnostic imaging of other parts of musculoskeletal system: Secondary | ICD-10-CM

## 2015-05-26 NOTE — Telephone Encounter (Signed)
LM to call back for results

## 2015-05-26 NOTE — Telephone Encounter (Signed)
-----   Message from Huston Foley, MD sent at 05/23/2015  4:40 PM EST ----- Please call patient regarding his recent lumbar spine MRI. The results indicate fairly severe multilevel degenerative changes, for which I would like for him to see a spine specialist. Some of his symptoms may be related to degenerative back disease. If he has a preference, we can make a referral to a back surgeon of this choice, otherwise we can certainly make a referral from our end if he is agreeable. Huston Foley, MD, PhD [facility])

## 2015-05-27 NOTE — Telephone Encounter (Signed)
Pt returned Diana's call °

## 2015-05-27 NOTE — Telephone Encounter (Signed)
I spoke to patient and he is aware of results and recommendations. Referral for neurosurgery consult submitted.

## 2015-05-29 DIAGNOSIS — R200 Anesthesia of skin: Secondary | ICD-10-CM | POA: Diagnosis not present

## 2015-05-29 DIAGNOSIS — Z6827 Body mass index (BMI) 27.0-27.9, adult: Secondary | ICD-10-CM | POA: Diagnosis not present

## 2015-05-29 DIAGNOSIS — R030 Elevated blood-pressure reading, without diagnosis of hypertension: Secondary | ICD-10-CM | POA: Diagnosis not present

## 2015-05-29 DIAGNOSIS — H401121 Primary open-angle glaucoma, left eye, mild stage: Secondary | ICD-10-CM | POA: Diagnosis not present

## 2015-06-26 DIAGNOSIS — R200 Anesthesia of skin: Secondary | ICD-10-CM | POA: Diagnosis not present

## 2015-06-26 DIAGNOSIS — M5134 Other intervertebral disc degeneration, thoracic region: Secondary | ICD-10-CM | POA: Diagnosis not present

## 2015-07-02 DIAGNOSIS — J449 Chronic obstructive pulmonary disease, unspecified: Secondary | ICD-10-CM | POA: Diagnosis not present

## 2015-08-06 ENCOUNTER — Ambulatory Visit: Payer: Medicare Other | Admitting: Neurology

## 2015-08-14 ENCOUNTER — Ambulatory Visit (INDEPENDENT_AMBULATORY_CARE_PROVIDER_SITE_OTHER): Payer: BC Managed Care – PPO | Admitting: Neurology

## 2015-08-14 ENCOUNTER — Encounter: Payer: Self-pay | Admitting: Neurology

## 2015-08-14 ENCOUNTER — Telehealth: Payer: Self-pay | Admitting: Neurology

## 2015-08-14 VITALS — BP 132/78 | HR 78 | Resp 18 | Ht 66.0 in | Wt 182.0 lb

## 2015-08-14 DIAGNOSIS — M79671 Pain in right foot: Secondary | ICD-10-CM

## 2015-08-14 DIAGNOSIS — R200 Anesthesia of skin: Secondary | ICD-10-CM

## 2015-08-14 DIAGNOSIS — R937 Abnormal findings on diagnostic imaging of other parts of musculoskeletal system: Secondary | ICD-10-CM | POA: Diagnosis not present

## 2015-08-14 DIAGNOSIS — M79672 Pain in left foot: Secondary | ICD-10-CM | POA: Diagnosis not present

## 2015-08-14 DIAGNOSIS — G609 Hereditary and idiopathic neuropathy, unspecified: Secondary | ICD-10-CM | POA: Diagnosis not present

## 2015-08-14 DIAGNOSIS — R202 Paresthesia of skin: Secondary | ICD-10-CM

## 2015-08-14 MED ORDER — AMITRIPTYLINE HCL 25 MG PO TABS
ORAL_TABLET | ORAL | Status: DC
Start: 2015-08-14 — End: 2016-05-14

## 2015-08-14 NOTE — Patient Instructions (Addendum)
We will get records from your neurosurgical visit.   Please limit your beer/alcohol intake. Drink more water!  We will do an EMG and nerve conduction velocity test, which is an electrical nerve and muscle test, which we will schedule. We will call you with the results.  We will try you on Elavil (generic name: amitriptyline) 25 mg: Take half a pill daily at bedtime for one week, then one pill daily at bedtime for one week, then one and a half pills daily at bedtime for one week, then 2 pills daily at bedtime thereafter. Common side effects reported are: mouth dryness, drowsiness, confusion, dizziness.  Do not combine at the same time as alcohol and be cautious with alcohol!

## 2015-08-14 NOTE — Telephone Encounter (Signed)
While checking out pt noticed Vitamin D, Ergocalciferol, (DRISDOL) 50000 units CAPS capsule was incorrect. Please adjust to 5,000 units. Thank you

## 2015-08-14 NOTE — Telephone Encounter (Signed)
I will correct in his chart
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
## 2015-08-17 ENCOUNTER — Encounter: Payer: Self-pay | Admitting: Neurology

## 2015-08-18 ENCOUNTER — Telehealth: Payer: Self-pay | Admitting: Neurology

## 2015-08-18 DIAGNOSIS — G609 Hereditary and idiopathic neuropathy, unspecified: Secondary | ICD-10-CM

## 2015-08-18 MED ORDER — NORTRIPTYLINE HCL 10 MG PO CAPS: ORAL_CAPSULE | ORAL | Status: DC

## 2015-08-18 NOTE — Telephone Encounter (Signed)
Please advise patient, that I read his email re: amitriptyline, we can try Pamelor (generic name: nortriptyline), 10 mg: Take 1 pill daily at bedtime for one week, then 2 pills daily at bedtime thereafter. Common side effects reported are: mouth dryness, drowsiness, confusion, dizziness.  Rx entered, pls remind him to stop the amitriptyline and start the Pamelor after 4 days, to make sure the other med is out of his system.

## 2015-08-18 NOTE — Telephone Encounter (Signed)
I spoke to patient and he is aware of recommendation below and is willing to try medication. He is aware that it has been sent to the pharmacy and will keep us updated.

## 2015-08-28 NOTE — Telephone Encounter (Signed)
Anything you would like me to tell the patient?

## 2015-08-28 NOTE — Telephone Encounter (Signed)
Patient called to advise he is going to stop taking nortriptyline (PAMELOR) 10 MG capsule, having side effects, tongue is blistered and "keeps getting worse and worse".

## 2015-08-28 NOTE — Telephone Encounter (Signed)
Agree with stopping the nortriptyline. He feels worse and tongue is swollen or yes difficulty talking or swallowing, he needs to go to the emergency room.

## 2015-08-28 NOTE — Telephone Encounter (Signed)
I spoke to [place] and he is aware of recommendations below voiced understanding. Will call back if any further questions.

## 2015-09-03 DIAGNOSIS — E559 Vitamin D deficiency, unspecified: Secondary | ICD-10-CM | POA: Diagnosis not present

## 2015-09-03 DIAGNOSIS — N200 Calculus of kidney: Secondary | ICD-10-CM | POA: Diagnosis not present

## 2015-09-03 DIAGNOSIS — E875 Hyperkalemia: Secondary | ICD-10-CM | POA: Diagnosis not present

## 2015-09-03 DIAGNOSIS — E785 Hyperlipidemia, unspecified: Secondary | ICD-10-CM | POA: Diagnosis not present

## 2015-09-03 DIAGNOSIS — R809 Proteinuria, unspecified: Secondary | ICD-10-CM | POA: Diagnosis not present

## 2015-09-03 DIAGNOSIS — G4730 Sleep apnea, unspecified: Secondary | ICD-10-CM | POA: Diagnosis not present

## 2015-09-03 DIAGNOSIS — E291 Testicular hypofunction: Secondary | ICD-10-CM | POA: Diagnosis not present

## 2015-09-03 DIAGNOSIS — I10 Essential (primary) hypertension: Secondary | ICD-10-CM | POA: Diagnosis not present

## 2015-09-03 DIAGNOSIS — E039 Hypothyroidism, unspecified: Secondary | ICD-10-CM | POA: Diagnosis not present

## 2015-09-17 ENCOUNTER — Ambulatory Visit (INDEPENDENT_AMBULATORY_CARE_PROVIDER_SITE_OTHER): Payer: Self-pay | Admitting: Neurology

## 2015-09-17 ENCOUNTER — Encounter: Payer: Self-pay | Admitting: Neurology

## 2015-09-17 ENCOUNTER — Ambulatory Visit (INDEPENDENT_AMBULATORY_CARE_PROVIDER_SITE_OTHER): Payer: BC Managed Care – PPO | Admitting: Neurology

## 2015-09-17 DIAGNOSIS — R202 Paresthesia of skin: Secondary | ICD-10-CM

## 2015-09-17 DIAGNOSIS — M79671 Pain in right foot: Secondary | ICD-10-CM

## 2015-09-17 DIAGNOSIS — G609 Hereditary and idiopathic neuropathy, unspecified: Secondary | ICD-10-CM

## 2015-09-17 DIAGNOSIS — M79672 Pain in left foot: Secondary | ICD-10-CM

## 2015-09-17 DIAGNOSIS — R200 Anesthesia of skin: Secondary | ICD-10-CM

## 2015-09-17 DIAGNOSIS — R937 Abnormal findings on diagnostic imaging of other parts of musculoskeletal system: Secondary | ICD-10-CM

## 2015-09-17 DIAGNOSIS — M1610 Unilateral primary osteoarthritis, unspecified hip: Secondary | ICD-10-CM

## 2015-09-17 NOTE — Progress Notes (Signed)
Please refer to EMG and nerve conduction study procedure note. 

## 2015-09-17 NOTE — Procedures (Signed)
     HISTORY:  Jason Friedman is a 75 year old gentleman with a history of numbness in the toes of the feet bilaterally that has been present for about 12 months. He denies significant low back pain or pain down the legs. He denies any significant balance issues. He is being evaluated for these sensory changes.  NERVE CONDUCTION STUDIES:  Nerve conduction studies were performed on both lower extremities. The distal motor latencies and motor amplitudes for the peroneal and posterior tibial nerves were within normal limits. The nerve conduction velocities for these nerves were also normal. The H reflex latencies were normal. The sensory latencies for the peroneal nerves were within normal limits.   EMG STUDIES:  EMG study was performed on the right lower extremity:  The tibialis anterior muscle reveals 2 to 5K motor units with slightly decreased recruitment. No fibrillations or positive waves were seen. The peroneus tertius muscle reveals 2 to 5K motor units with slightly decreased recruitment. No fibrillations or positive waves were seen. The medial gastrocnemius muscle reveals 2 to 4K motor units with full recruitment. No fibrillations or positive waves were seen. The vastus lateralis muscle reveals 2 to 4K motor units with full recruitment. No fibrillations or positive waves were seen. The iliopsoas muscle reveals 2 to 4K motor units with full recruitment. No fibrillations or positive waves were seen. One isolated run of positive waves were seen. The biceps femoris muscle (long head) reveals 2 to 4K motor units with full recruitment. No fibrillations or positive waves were seen. The lumbosacral paraspinal muscles were tested at 3 levels, and revealed no abnormalities of insertional activity at the upper level tested. Slight increase in insertional activity was seen at the mid-level, 1+ positive waves were seen at the lower level. There was good relaxation.  EMG study was performed on the  left lower extremity:  The tibialis anterior muscle reveals 2 to 5K motor units with slightly decreased recruitment. No fibrillations or positive waves were seen. The peroneus tertius muscle reveals 2 to 6K motor units with slightly decreased recruitment. No fibrillations or positive waves were seen. The medial gastrocnemius muscle reveals 2 to 4K motor units with minimally decreased recruitment. No fibrillations or positive waves were seen. The vastus lateralis muscle reveals 2 to 4K motor units with full recruitment. No fibrillations or positive waves were seen. The iliopsoas muscle reveals 2 to 4K motor units with full recruitment. No fibrillations or positive waves were seen. The biceps femoris muscle (long head) reveals 2 to 4K motor units with full recruitment. No fibrillations or positive waves were seen. The lumbosacral paraspinal muscles were tested at 3 levels, and revealed no abnormalities of insertional activity at the upper level tested. Some increase in insertional activity seen at the mid-level, 1+ positive waves were seen at the lower level. There was good relaxation.   IMPRESSION:  Nerve conduction studies done on both lower extremities were within normal limits. No evidence of a peripheral neuropathy is seen. EMG evaluation of both lower extremities show similar findings with mild distal chronic stable signs of denervation below the knees, and evidence of mild acute denervation in the lumbosacral paraspinal muscles. The possibility of an overlying lumbosacral spinal stenosis affecting the L5 and S1 nerve roots should be considered, clinical correlation is required.  Marlan Palau. Keith Willis MD 09/17/2015 10:14 AM  [facility] [address] [place], [place] [id number]  Phone [phone] Fax [phone]

## 2015-09-29 NOTE — Progress Notes (Signed)
Quick Note:  Please call and advise the patient that the recent EMG and nerve conduction velocity test, which is the electrical nerve and muscle test we we performed, was reported as MOSTLY within normal limits. We checked for abnormal electrical discharges in the muscles and/or nerves and the report suggested normal findings as far as the nerve conduction study, indicating no overt or widespread neuropathy. On the needle test of the muscle there were some mild abnormalities, indicating, that there may be a pinched nerve root type of issue coming from the lowest part of the lumbar spine. Overall no sinister findings. If he has low back pain too, he can consider seeing a spine doctor/orthopedic doctor. Otherwise, no further action is required on this test at this time. Please remind patient to keep any upcoming appointments or tests and to call us with any interim questions, concerns, problems or updates. Thanks,  Huston FoleySaima Loden Laurent, MD, PhD   ______

## 2015-09-30 ENCOUNTER — Telehealth: Payer: Self-pay

## 2015-09-30 NOTE — Telephone Encounter (Signed)
-----   Message from Huston FoleySaima Athar, MD sent at 09/29/2015  1:41 PM EDT ----- Please call and advise the patient that the recent EMG and nerve conduction velocity test, which is the electrical nerve and muscle test we we performed, was reported as MOSTLY within normal limits. We checked for abnormal electrical discharges in the muscles and/or nerves and the report suggested normal findings as far as the nerve conduction study, indicating no overt or widespread neuropathy. On the needle test of the muscle there were some mild abnormalities, indicating, that there may be a pinched nerve root type of issue coming from the lowest part of the lumbar spine. Overall no sinister findings. If he has low back pain too, he can consider seeing a spine doctor/orthopedic doctor. Otherwise, no further action is required on this test at this time. Please remind patient to keep any upcoming appointments or tests and to call us with any interim questions, concerns, problems or updates. Thanks,  Huston FoleySaima Athar, MD, PhD

## 2015-09-30 NOTE — Telephone Encounter (Signed)
I spoke to patient and he is aware of results and agreeable to findings. He does not want to see an orthopaedist at this time but will call back if any changes.

## 2015-10-08 DIAGNOSIS — H2513 Age-related nuclear cataract, bilateral: Secondary | ICD-10-CM | POA: Diagnosis not present

## 2015-10-08 DIAGNOSIS — H11423 Conjunctival edema, bilateral: Secondary | ICD-10-CM | POA: Diagnosis not present

## 2015-10-08 DIAGNOSIS — H11153 Pinguecula, bilateral: Secondary | ICD-10-CM | POA: Diagnosis not present

## 2015-10-08 DIAGNOSIS — H401221 Low-tension glaucoma, left eye, mild stage: Secondary | ICD-10-CM | POA: Diagnosis not present

## 2015-10-08 DIAGNOSIS — H25013 Cortical age-related cataract, bilateral: Secondary | ICD-10-CM | POA: Diagnosis not present

## 2015-10-08 DIAGNOSIS — H18413 Arcus senilis, bilateral: Secondary | ICD-10-CM | POA: Diagnosis not present

## 2015-10-08 DIAGNOSIS — H25043 Posterior subcapsular polar age-related cataract, bilateral: Secondary | ICD-10-CM | POA: Diagnosis not present

## 2015-12-22 ENCOUNTER — Ambulatory Visit: Payer: Medicare Other | Admitting: Neurology

## 2016-05-04 ENCOUNTER — Telehealth (HOSPITAL_COMMUNITY): Payer: Self-pay | Admitting: Internal Medicine

## 2016-05-05 ENCOUNTER — Other Ambulatory Visit: Payer: Self-pay | Admitting: Internal Medicine

## 2016-05-05 DIAGNOSIS — R9431 Abnormal electrocardiogram [ECG] [EKG]: Secondary | ICD-10-CM

## 2016-05-06 NOTE — Telephone Encounter (Signed)
05/04/16 Called pt and lmsg for him notifiying him that his new appt time will be 10am due to inclement weather. I asked that he call back if he needed to rescheduled all together    By Elita Booneegina A Juanjesus Pepperman

## 2016-05-14 ENCOUNTER — Ambulatory Visit (INDEPENDENT_AMBULATORY_CARE_PROVIDER_SITE_OTHER): Payer: Medicare Other | Admitting: Cardiology

## 2016-05-14 ENCOUNTER — Encounter: Payer: Self-pay | Admitting: Cardiology

## 2016-05-14 VITALS — BP 142/78 | HR 86 | Ht 69.0 in | Wt 181.0 lb

## 2016-05-14 DIAGNOSIS — R9431 Abnormal electrocardiogram [ECG] [EKG]: Secondary | ICD-10-CM | POA: Diagnosis not present

## 2016-05-14 NOTE — Patient Instructions (Signed)
Medication Instructions:  Continue current medications  Labwork: None Ordered  Testing/Procedures: Keep appointment for Echo 05/19/2016  Follow-Up: Your physician recommends that you schedule a follow-up appointment in: As Needed   Any Other Special Instructions Will Be Listed Below (If Applicable).   If you need a refill on your cardiac medications before your next appointment, please call your pharmacy.

## 2016-05-14 NOTE — Progress Notes (Signed)
Cardiology Office Note   Date:  05/16/2016   ID:  Jason Homans., DOB [birth year], MRN [id number]  PCP:  Pearla Dubonnet, MD  Cardiologist:   Rollene Rotunda, MD  Referring:  Pearla Dubonnet, MD  Chief Complaint  Patient presents with  . Shortness of Breath  . Dizziness      History of Present Illness: Jason Fok. is a 75 y.o. male who presents for evaluation of an abnormal EKG.  he's had this noted in the past and was told that he likely has an unusual location of his heart. He's not had any prior cardiac diagnoses. He's not had any need for testing. 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 It twice had trauma to his neck. Despite that is a very active young looking 76 year old. He cuts wood. He does yard work.  The patient denies any new symptoms such as chest discomfort, neck or arm discomfort. There has been no new shortness of breath, PND or orthopnea. There have been no reported palpitations, presyncope or syncope.  Past Medical History:  Diagnosis Date  . Anemia   . Arthritis   . Cervical spine fracture (HCC) 2  . Cervical vertebral fracture (HCC) 25 yrs ago   C 5   . 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 COPD (chronic obstructive pulmonary disease) (HCC)    mild copd per lov note dr Meredeth Ide 04-16-11 on chart  . Fracture of cervical vertebrae, multiple (HCC)  in high school   2 cervical vertebrae fx, 3 dislocated cervical vertebrae  . Heart abnormality    heart anatomicall rotated, causes abnormal ekg  . Hypertension    hx on, none current  . Hypogonadism in male   . Hypothyroidism   . Impaired memory    x 2 yrs ago after neck injury  . Insomnia   . PONV (postoperative nausea and vomiting)    likes scopolamine patch  . Prostate irregularity    nonfuctional prostate, on testerone gel bid  . Sigmoid diverticulitis   . Sleep apnea    does not use due to weight loss, does not need now    Past Surgical  History:  Procedure Laterality Date  . piliodional cyst  yrs ago   done x 3  . right shoulder arthroscopy  Mar 02, 2011  . TONSILLECTOMY    . TOTAL HIP ARTHROPLASTY  08/23/2011   Procedure: TOTAL HIP ARTHROPLASTY;  Surgeon: Loanne Drilling, MD;  Location: [facility];  Service: Orthopedics;  Laterality: Right;     Current Outpatient Prescriptions  Medication Sig Dispense Refill  . ANDROGEL PUMP 20.25 MG/ACT (1.62%) GEL APPLY 3 PUMPS TWICE A DAY  0  . Cholecalciferol (VITAMIN D-3) 1000 units CAPS Take 5,000 Units by mouth.     . dorzolamide (TRUSOPT) 2 % ophthalmic solution     . fluticasone (FLONASE) 50 MCG/ACT nasal spray SPRAY 2 SPRAY INTO BOTH NOSTRILS ONCE A DAY 30  8  . HYDROcodone-acetaminophen (NORCO/VICODIN) 5-325 MG tablet TAKE 1 TAB AS NEEDED FOR PAIN EVERY 6 HOURS  0  . levothyroxine (SYNTHROID, LEVOTHROID) 137 MCG tablet TAKE 1 TABLET IN THE MORNING ON AN EMPTY STOMACH  3  . tadalafil (CIALIS) 5 MG tablet Take 5 mg by mouth daily as needed for erectile dysfunction.    . vitamin C (ASCORBIC ACID) 500 MG tablet Take 500 mg by mouth daily.     No current facility-administered medications for this  visit.     Allergies:   Cefuroxime axetil [ceftin]; Gabapentin; Latex; Shellfish allergy; and Sulfa antibiotics    Social History:  The patient  reports that he has never smoked. He has never used smokeless tobacco. He reports that he does not drink alcohol or use drugs.   Family History:  The patient's family history includes Breast cancer in his mother and sister; Colon cancer in his sister; Diabetes in his sister and sister; Prostate cancer in his father.    ROS:  Please see the history of present illness.   Otherwise, review of systems are positive for none.   All other systems are reviewed and negative.    PHYSICAL EXAM: VS:  BP (!) 142/78   Pulse 86   Ht 5\' 9"  (1.753 m)   Wt 181 lb (82.1 kg)   BMI 26.73 kg/m  , BMI Body mass index is 26.73 kg/m. GENERAL:  Well appearing  and younger than stated age.  HEENT:  Pupils equal round and reactive, fundi not visualized, oral mucosa unremarkable NECK:  No jugular venous distention, waveform within normal limits, carotid upstroke brisk and symmetric, no bruits, no thyromegaly LYMPHATICS:  No cervical, inguinal adenopathy LUNGS:  Clear to auscultation bilaterally BACK:  No CVA tenderness CHEST:  Unremarkable HEART:  PMI not displaced or sustained,S1 and S2 within normal limits, no S3, no S4, no clicks, no rubs, no murmurs ABD:  Flat, positive bowel sounds normal in frequency in pitch, no bruits, no rebound, no guarding, no midline pulsatile mass, no hepatomegaly, no splenomegaly EXT:  2 plus pulses throughout, no edema, no cyanosis no clubbing SKIN:  No rashes no nodules NEURO:  Cranial nerves II through XII grossly intact, motor grossly intact throughout PSYCH:  Cognitively intact, oriented to person place and time    EKG:  EKG is  ordered today. The ekg ordered today demonstrates sinus rhythm, rate 86, low voltage in the limb and precordial leads, poor anterior R wave progression, no acute ST-T wave changes..     Recent Labs: No results found for requested labs within last 8760 hours.    Lipid Panel No results found for: CHOL, TRIG, HDL, CHOLHDL, VLDL, LDLCALC, LDLDIRECT    Wt Readings from Last 3 Encounters:  05/14/16 181 lb (82.1 kg)  08/14/15 182 lb (82.6 kg)  05/15/15 182 lb (82.6 kg)      Other studies Reviewed: Additional studies/ records that were reviewed today include: Office records. Review of the above records demonstrates:  Please see elsewhere in the note.     ASSESSMENT AND PLAN:  ABNORMAL EKG:  The patient does have an abnormal EKG. He has a slight barrel shaped chest. This certainly could explain some of the changes in his EKG. I'm going to get an echocardiogram to further evaluate this. Further testing will be based on these results.  HTN:     Apparently this has been borderline.  No change in therapy is indicated. He can follow this with blood pressure diary.    Current medicines are reviewed at length with the patient today.  The patient does not have concerns regarding medicines.  The following changes have been made:  no change  Labs/ tests ordered today include:   Orders Placed This Encounter  Procedures  . EKG 12-Lead     Disposition:   FU with me as needed and based on the echo results.      Signed, Rollene RotundaJames Jadae Steinke, MD  05/16/2016 4:23 PM    [facility]

## 2016-05-16 ENCOUNTER — Encounter: Payer: Self-pay | Admitting: Cardiology

## 2016-05-19 ENCOUNTER — Other Ambulatory Visit: Payer: Self-pay

## 2016-05-19 ENCOUNTER — Ambulatory Visit (HOSPITAL_COMMUNITY): Payer: Medicare Other | Attending: Cardiology

## 2016-05-19 DIAGNOSIS — R9431 Abnormal electrocardiogram [ECG] [EKG]: Secondary | ICD-10-CM | POA: Insufficient documentation

## 2017-03-30 NOTE — Pre-Procedure Instructions (Signed)
Jason HomansBrooks J Barrie Friedman.  03/30/2017      [id number] [place]- [place], [place] - [address] [place]  [place] [id number] Phone: [phone] Fax: [phone]    Your procedure is scheduled on April 07, 2017.  [facility] at 800 AM.  Call this number if you have problems the morning of surgery:  [phone]   Remember:  Do not eat food or drink liquids after midnight.  Take these medicines the morning of surgery with A SIP OF WATER hydrcodone-acetaminophen (norco)-if needed for pain, levothyroxine (synthroid).  7 days prior to surgery STOP taking any Aspirin (unless otherwise instructed by your surgeon), Aleve, Naproxen, Ibuprofen, Motrin, Advil, Goody's, BC's, all herbal medications, fish oil, and all vitamins  Continue all other medications as instructed by your physician except follow the above medication instructions before surgery   Do not wear jewelry, make-up or nail polish.  Do not wear lotions, powders, or perfumes, or deoderant.  Men may shave face and neck.  Do not bring valuables to the hospital.  [facility] is not responsible for any belongings or valuables.  Contacts, dentures or bridgework may not be worn into surgery.  Leave your suitcase in the car.  After surgery it may be brought to your room.  For patients admitted to the hospital, discharge time will be determined by your treatment team.  Patients discharged the day of surgery will not be allowed to drive home.   Special instructions:   [place]- Preparing For Surgery  Before surgery, you can play an important role. Because skin is not sterile, your skin needs to be as free of germs as possible. You can reduce the number of germs on your skin by washing with CHG (chlorahexidine gluconate) Soap before surgery.  CHG is an antiseptic cleaner which kills germs and bonds with the skin to continue killing germs even after washing.  Please do not use if you  have an allergy to CHG or antibacterial soaps. If your skin becomes reddened/irritated stop using the CHG.  Do not shave (including legs and underarms) for at least 48 hours prior to first CHG shower. It is OK to shave your face.  Please follow these instructions carefully.   1. Shower the NIGHT BEFORE SURGERY and the MORNING OF SURGERY with CHG.   2. If you chose to wash your hair, wash your hair first as usual with your normal shampoo.  3. After you shampoo, rinse your hair and body thoroughly to remove the shampoo.  4. Use CHG as you would any other liquid soap. You can apply CHG directly to the skin and wash gently with a scrungie or a clean washcloth.   5. Apply the CHG Soap to your body ONLY FROM THE NECK DOWN.  Do not use on open wounds or open sores. Avoid contact with your eyes, ears, mouth and genitals (private parts). Wash Face and genitals (private parts)  with your normal soap.  6. Wash thoroughly, paying special attention to the area where your surgery will be performed.  7. Thoroughly rinse your body with warm water from the neck down.  8. DO NOT shower/wash with your normal soap after using and rinsing off the CHG Soap.  9. Pat yourself dry with a CLEAN TOWEL.  10. Wear CLEAN PAJAMAS to bed the night before surgery, wear comfortable clothes the morning of surgery  11. Place CLEAN SHEETS on your bed the night of your first shower and  DO NOT SLEEP WITH PETS.    Day of Surgery: Do not apply any deodorants/lotions. Please wear clean clothes to the hospital/surgery center.     Please read over the following fact sheets that you were given. Pain Booklet, Coughing and Deep Breathing, MRSA Information and Surgical Site Infection Prevention

## 2017-03-31 ENCOUNTER — Encounter (HOSPITAL_COMMUNITY): Payer: Self-pay

## 2017-03-31 ENCOUNTER — Other Ambulatory Visit: Payer: Self-pay

## 2017-03-31 ENCOUNTER — Encounter (HOSPITAL_COMMUNITY)
Admission: RE | Admit: 2017-03-31 | Discharge: 2017-03-31 | Disposition: A | Discharge status: Home / Self Care | Payer: Medicare Other | Source: Ambulatory Visit | Attending: Orthopedic Surgery | Admitting: Orthopedic Surgery

## 2017-03-31 DIAGNOSIS — E039 Hypothyroidism, unspecified: Secondary | ICD-10-CM | POA: Insufficient documentation

## 2017-03-31 DIAGNOSIS — J449 Chronic obstructive pulmonary disease, unspecified: Secondary | ICD-10-CM | POA: Diagnosis not present

## 2017-03-31 DIAGNOSIS — G4733 Obstructive sleep apnea (adult) (pediatric): Secondary | ICD-10-CM | POA: Insufficient documentation

## 2017-03-31 DIAGNOSIS — S199XXS Unspecified injury of neck, sequela: Secondary | ICD-10-CM | POA: Diagnosis not present

## 2017-03-31 DIAGNOSIS — K5792 Diverticulitis of intestine, part unspecified, without perforation or abscess without bleeding: Secondary | ICD-10-CM | POA: Insufficient documentation

## 2017-03-31 DIAGNOSIS — Z01812 Encounter for preprocedural laboratory examination: Secondary | ICD-10-CM | POA: Diagnosis present

## 2017-03-31 DIAGNOSIS — Z79899 Other long term (current) drug therapy: Secondary | ICD-10-CM | POA: Insufficient documentation

## 2017-03-31 DIAGNOSIS — I10 Essential (primary) hypertension: Secondary | ICD-10-CM | POA: Insufficient documentation

## 2017-03-31 DIAGNOSIS — G4700 Insomnia, unspecified: Secondary | ICD-10-CM | POA: Diagnosis not present

## 2017-03-31 LAB — SURGICAL PCR SCREEN
MRSA, PCR: NEGATIVE
Staphylococcus aureus: NEGATIVE

## 2017-03-31 LAB — CBC
HCT: 44.1 % (ref 39.0–52.0)
Hemoglobin: 15.1 g/dL (ref 13.0–17.0)
MCH: 34.2 pg — AB (ref 26.0–34.0)
MCHC: 34.2 g/dL (ref 30.0–36.0)
MCV: 99.8 fL (ref 78.0–100.0)
PLATELETS: 293 10*3/uL (ref 150–400)
RBC: 4.42 MIL/uL (ref 4.22–5.81)
RDW: 12.7 % (ref 11.5–15.5)
WBC: 6.6 10*3/uL (ref 4.0–10.5)

## 2017-03-31 LAB — BASIC METABOLIC PANEL
ANION GAP: 9 (ref 5–15)
BUN: 13 mg/dL (ref 6–20)
CO2: 26 mmol/L (ref 22–32)
Calcium: 9.3 mg/dL (ref 8.9–10.3)
Chloride: 103 mmol/L (ref 101–111)
Creatinine, Ser: 1.03 mg/dL (ref 0.61–1.24)
GFR calc Af Amer: >60 mL/min (ref >60)
GFR calc non Af Amer: >60 mL/min (ref >60)
GLUCOSE: 105 mg/dL — AB (ref 65–99)
POTASSIUM: 4.6 mmol/L (ref 3.5–5.1)
Sodium: 138 mmol/L (ref 135–145)

## 2017-03-31 NOTE — Progress Notes (Signed)
Pt. Remarks relative to Cymbalta that he had  pain on the L side of his body- hip,leg, foot- reports that it was rather "piercing", extreme to the point that because of the pain it caused him to be confined to a chair most of the time.  The initial; reaction was a week or two but he has been off of it for 6 wks,. now & still complains of some pain in those areas. Pt. Has reported this new finding to Dr. Kevan NyGates office- PCP.  Pt. Is back to chopping wood & other outdoor activities that do not "wind" him. In fact pt. Reports that he feels stable in his breathing/ chest.  [facility]. Pt. Saw Dr. Antoine PocheHochrein early 2018- ECHO followed .

## 2017-04-01 NOTE — Progress Notes (Signed)
Anesthesia Chart Review: Patient is a 76 year old male scheduled for left total shoulder arthroplasty on 04/07/17 by Dr. Francena HanlyKevin Supple.  History includes never smoker, post-operative N/V, HTN, hypothyroidism, cervical fractures/dislocation (neck injury in high school and > 20 years ago), COPD (mild), OSA (intolerant to CPAP), insomnia, low T, sigmoid diverticulitis, right THA 08/23/11. Notes indicate patient reported prior history of abnormal EKG (possibly due elevated right hemidiaphragm with "rotated" heart). He was referred to cardiologist Dr. Rollene RotundaJames Hochrein in 04/2016 due to abnormal EKG. Patient was able to cut wood and do yard work. He denied any new symptoms such as chest pain, arm discomfort, palpitations, syncope/presyncope, and no new SOB. PRN follow-up recommended pending echo results (see below). EKG showed  Poor r wave progression and low voltage.  PCP is Dr. Pearla Dubonnetobert Nevill Gates. Pulmonologist is Dr. Ned ClinesHerbon Fleming [facility]; see Care Everywhere). Last visit 10/19/16 with 8 month follow-up recommended.   Meds include levothyroxine, losartan, Cialis, vitamin E.  BP 135/90   Pulse 77   Temp 36.6 C   Resp 20   Ht 5' 8.5" (1.74 m)   Wt 172 lb 1.6 oz (78.1 kg)   SpO2 98%   BMI 25.79 kg/m   EKG 05/14/16: NSR, low voltage QRS, cannot rule out anterior infarct (age undetermined).  Echo 05/19/16: Study Conclusions - Left ventricle: The cavity size was normal. Wall thickness was   normal. Systolic function was normal. The estimated ejection   fraction was in the range of 55% to 60%. Wall motion was normal;   there were no regional wall motion abnormalities. Doppler   parameters are consistent with abnormal left ventricular   relaxation (grade 1 diastolic dysfunction). - Aortic valve: There was no stenosis. - Aorta: Borderline dilated aortic root. Aortic root dimension: 37   mm (ED). - Mitral valve: There was no significant regurgitation. - Right ventricle: The cavity size was  normal. Systolic function   was normal. - Pulmonary arteries: No complete TR doppler jet so unable to   estimate PA systolic pressure. - Inferior vena cava: The vessel was normal in size. The   respirophasic diameter changes were in the normal range (= 50%),   consistent with normal central venous pressure. Impressions: - Normal LV size with EF 55-60%. Normal RV size and systolic   function. No significant valvular abnormalities.  Chest CT 08/06/09: Evaluation of the mediastinum and hilar regions and structures  demonstrates  no evidence of mediastinal nor hilar adenopathy nor masses. The lung  parenchyma demonstrates no evidence of focal infiltrates, effusions,  edema,  masses or nodules. The visualized upper abdominal viscera demonstrate  calcified density within the liver. The remaining visualized upper  abdominal  viscera demonstrate no further gross abnormalities.  IMPRESSION:  1. Chest CT without evidence of focal or acute abnormalities.  2. 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   Spirometry 02/13/16 (according to Dr. Reita ClicheFleming's note): SPIROMETRY: FVC was 3.18 liters, 87% of predicted FEV1 was 2.28, 80% of predicted FEV1 ratio was 72 FEF 25-75% liters per second was 50% of predicted LUNG VOLUMES: TLC was 76% of predicted RV was 56% of predicted DIFFUSION CAPACITY: DLCO was 122% of predicted DLCO/VA was 137% of predicted FLOW VOLUME LOOP: Impression Spirometry is c/w mild obstruction TLC is mildly decreased Diffusion capacity is normal No worsening dyspnea Impression: Mild copd stable Left paralyzed diaphragm [xray actually show RIGHT hemidiaphragm elevation] Dyspnea no worse, overall doing well  Preoperative labs noted. H/H 15.1/44.1, PLT 293. Cr 1.03,  glucose 105.   Based on currently available information, I anticipate that he can proceed as planned if no acute changes. As above, he has RIGHT hemidiaphragm elevation.  Velna Ochsllison Juan Olthoff,  PA-C [facility]/Anesthesiology Phone [phone] 04/01/2017 3:21 PM

## 2017-04-07 ENCOUNTER — Inpatient Hospital Stay (HOSPITAL_COMMUNITY): Payer: Medicare Other | Admitting: Anesthesiology

## 2017-04-07 ENCOUNTER — Inpatient Hospital Stay (HOSPITAL_COMMUNITY): Payer: Medicare Other | Admitting: Vascular Surgery

## 2017-04-07 ENCOUNTER — Encounter (HOSPITAL_COMMUNITY)
Admission: RE | Disposition: A | Discharge status: Home / Self Care | Payer: Self-pay | Source: Ambulatory Visit | Attending: Orthopedic Surgery

## 2017-04-07 ENCOUNTER — Other Ambulatory Visit: Payer: Self-pay

## 2017-04-07 ENCOUNTER — Encounter (HOSPITAL_COMMUNITY): Payer: Self-pay | Admitting: *Deleted

## 2017-04-07 ENCOUNTER — Inpatient Hospital Stay (HOSPITAL_COMMUNITY)
Admission: RE | Admit: 2017-04-07 | Discharge: 2017-04-08 | DRG: 483 | Disposition: A | Discharge status: Home / Self Care | Payer: Medicare Other | Source: Ambulatory Visit | Attending: Orthopedic Surgery | Admitting: Orthopedic Surgery

## 2017-04-07 DIAGNOSIS — E039 Hypothyroidism, unspecified: Secondary | ICD-10-CM | POA: Diagnosis present

## 2017-04-07 DIAGNOSIS — M19012 Primary osteoarthritis, left shoulder: Secondary | ICD-10-CM | POA: Diagnosis not present

## 2017-04-07 DIAGNOSIS — Z96619 Presence of unspecified artificial shoulder joint: Secondary | ICD-10-CM

## 2017-04-07 DIAGNOSIS — M25512 Pain in left shoulder: Secondary | ICD-10-CM | POA: Diagnosis present

## 2017-04-07 DIAGNOSIS — Z96649 Presence of unspecified artificial hip joint: Secondary | ICD-10-CM | POA: Diagnosis present

## 2017-04-07 DIAGNOSIS — Z79890 Hormone replacement therapy (postmenopausal): Secondary | ICD-10-CM

## 2017-04-07 DIAGNOSIS — J449 Chronic obstructive pulmonary disease, unspecified: Secondary | ICD-10-CM | POA: Diagnosis not present

## 2017-04-07 HISTORY — PX: TOTAL SHOULDER ARTHROPLASTY: SHX126

## 2017-04-07 SURGERY — ARTHROPLASTY, SHOULDER, TOTAL
Anesthesia: General | Site: Shoulder | Laterality: Left

## 2017-04-07 MED ORDER — POLYETHYLENE GLYCOL 3350 17 G PO PACK: 17.0000 g | PACK | Freq: Every day | ORAL | Status: DC | PRN

## 2017-04-07 MED ORDER — KETAMINE HCL 10 MG/ML IJ SOLN
INTRAMUSCULAR | Status: DC | PRN
  Administered 2017-04-07: 10 mg via INTRAVENOUS
  Administered 2017-04-07: 40 mg via INTRAVENOUS
  Administered 2017-04-07: 10 mg via INTRAVENOUS

## 2017-04-07 MED ORDER — SUGAMMADEX SODIUM 200 MG/2ML IV SOLN
INTRAVENOUS | Status: AC
  Filled 2017-04-07: qty 2

## 2017-04-07 MED ORDER — FLEET ENEMA 7-19 GM/118ML RE ENEM: 1.0000 | ENEMA | Freq: Once | RECTAL | Status: DC | PRN

## 2017-04-07 MED ORDER — ONDANSETRON HCL 4 MG PO TABS: 4.0000 mg | ORAL_TABLET | Freq: Four times a day (QID) | ORAL | Status: DC | PRN

## 2017-04-07 MED ORDER — CLINDAMYCIN PHOSPHATE 900 MG/50ML IV SOLN
INTRAVENOUS | Status: AC
  Filled 2017-04-07: qty 50

## 2017-04-07 MED ORDER — EPHEDRINE SULFATE-NACL 50-0.9 MG/10ML-% IV SOSY
PREFILLED_SYRINGE | INTRAVENOUS | Status: DC | PRN
  Administered 2017-04-07: 10 mg via INTRAVENOUS
  Administered 2017-04-07: 5 mg via INTRAVENOUS

## 2017-04-07 MED ORDER — OXYCODONE HCL 5 MG PO TABS
5.0000 mg | ORAL_TABLET | ORAL | Status: DC | PRN
  Administered 2017-04-07: 5 mg via ORAL
  Filled 2017-04-07: qty 1

## 2017-04-07 MED ORDER — ROCURONIUM BROMIDE 10 MG/ML (PF) SYRINGE
PREFILLED_SYRINGE | INTRAVENOUS | Status: AC
  Filled 2017-04-07: qty 5

## 2017-04-07 MED ORDER — OXYCODONE HCL 5 MG PO TABS
10.0000 mg | ORAL_TABLET | ORAL | Status: DC | PRN
  Administered 2017-04-07 – 2017-04-08 (×2): 10 mg via ORAL
  Filled 2017-04-07 (×2): qty 2

## 2017-04-07 MED ORDER — KETOROLAC TROMETHAMINE 15 MG/ML IJ SOLN
7.5000 mg | Freq: Four times a day (QID) | INTRAMUSCULAR | Status: DC
  Administered 2017-04-07 – 2017-04-08 (×3): 7.5 mg via INTRAVENOUS
  Filled 2017-04-07 (×3): qty 1

## 2017-04-07 MED ORDER — DIPHENHYDRAMINE HCL 12.5 MG/5ML PO ELIX: 12.5000 mg | ORAL_SOLUTION | ORAL | Status: DC | PRN

## 2017-04-07 MED ORDER — PHENYLEPHRINE 40 MCG/ML (10ML) SYRINGE FOR IV PUSH (FOR BLOOD PRESSURE SUPPORT)
PREFILLED_SYRINGE | INTRAVENOUS | Status: DC | PRN
  Administered 2017-04-07 (×2): 80 ug via INTRAVENOUS

## 2017-04-07 MED ORDER — CHLORHEXIDINE GLUCONATE 4 % EX LIQD: 60.0000 mL | Freq: Once | CUTANEOUS | Status: DC

## 2017-04-07 MED ORDER — EPHEDRINE 5 MG/ML INJ
INTRAVENOUS | Status: AC
  Filled 2017-04-07: qty 10

## 2017-04-07 MED ORDER — BISACODYL 5 MG PO TBEC: 5.0000 mg | DELAYED_RELEASE_TABLET | Freq: Every day | ORAL | Status: DC | PRN

## 2017-04-07 MED ORDER — OXYCODONE HCL 5 MG PO TABS
ORAL_TABLET | ORAL | Status: AC
  Filled 2017-04-07: qty 1

## 2017-04-07 MED ORDER — SUGAMMADEX SODIUM 200 MG/2ML IV SOLN
INTRAVENOUS | Status: DC | PRN
  Administered 2017-04-07: 200 mg via INTRAVENOUS

## 2017-04-07 MED ORDER — ACETAMINOPHEN 500 MG PO TABS
ORAL_TABLET | ORAL | Status: AC
  Administered 2017-04-07: 1000 mg via ORAL
  Filled 2017-04-07: qty 2

## 2017-04-07 MED ORDER — KETOROLAC TROMETHAMINE 30 MG/ML IJ SOLN
INTRAMUSCULAR | Status: AC
  Filled 2017-04-07: qty 1

## 2017-04-07 MED ORDER — KETAMINE HCL-SODIUM CHLORIDE 100-0.9 MG/10ML-% IV SOSY
PREFILLED_SYRINGE | INTRAVENOUS | Status: AC
  Filled 2017-04-07: qty 20

## 2017-04-07 MED ORDER — DIAZEPAM 5 MG PO TABS: 2.5000 mg | ORAL_TABLET | Freq: Four times a day (QID) | ORAL | Status: DC | PRN

## 2017-04-07 MED ORDER — ACETAMINOPHEN 650 MG RE SUPP: 650.0000 mg | RECTAL | Status: DC | PRN

## 2017-04-07 MED ORDER — LIDOCAINE 2% (20 MG/ML) 5 ML SYRINGE
INTRAMUSCULAR | Status: AC
  Filled 2017-04-07: qty 5

## 2017-04-07 MED ORDER — ACETAMINOPHEN 500 MG PO TABS
1000.0000 mg | ORAL_TABLET | Freq: Once | ORAL | Status: AC
  Administered 2017-04-07: 1000 mg via ORAL

## 2017-04-07 MED ORDER — CLINDAMYCIN PHOSPHATE 900 MG/50ML IV SOLN
900.0000 mg | INTRAVENOUS | Status: AC
  Administered 2017-04-07: 900 mg via INTRAVENOUS

## 2017-04-07 MED ORDER — LOSARTAN POTASSIUM 25 MG PO TABS
25.0000 mg | ORAL_TABLET | Freq: Every day | ORAL | Status: DC
  Administered 2017-04-07: 25 mg via ORAL
  Filled 2017-04-07: qty 1

## 2017-04-07 MED ORDER — DEXAMETHASONE SODIUM PHOSPHATE 10 MG/ML IJ SOLN
INTRAMUSCULAR | Status: AC
  Filled 2017-04-07: qty 1

## 2017-04-07 MED ORDER — SUCCINYLCHOLINE CHLORIDE 200 MG/10ML IV SOSY
PREFILLED_SYRINGE | INTRAVENOUS | Status: DC | PRN
  Administered 2017-04-07: 100 mg via INTRAVENOUS

## 2017-04-07 MED ORDER — DOCUSATE SODIUM 100 MG PO CAPS
100.0000 mg | ORAL_CAPSULE | Freq: Two times a day (BID) | ORAL | Status: DC
  Administered 2017-04-07 – 2017-04-08 (×2): 100 mg via ORAL
  Filled 2017-04-07 (×2): qty 1

## 2017-04-07 MED ORDER — CLINDAMYCIN PHOSPHATE 600 MG/50ML IV SOLN
600.0000 mg | Freq: Four times a day (QID) | INTRAVENOUS | Status: AC
  Administered 2017-04-07 – 2017-04-08 (×3): 600 mg via INTRAVENOUS
  Filled 2017-04-07 (×3): qty 50

## 2017-04-07 MED ORDER — METOCLOPRAMIDE HCL 5 MG/ML IJ SOLN: 5.0000 mg | Freq: Three times a day (TID) | INTRAMUSCULAR | Status: DC | PRN

## 2017-04-07 MED ORDER — GABAPENTIN 300 MG PO CAPS
ORAL_CAPSULE | ORAL | Status: AC
  Filled 2017-04-07: qty 2

## 2017-04-07 MED ORDER — ALUM & MAG HYDROXIDE-SIMETH 200-200-20 MG/5ML PO SUSP: 30.0000 mL | ORAL | Status: DC | PRN

## 2017-04-07 MED ORDER — SUCCINYLCHOLINE CHLORIDE 200 MG/10ML IV SOSY
PREFILLED_SYRINGE | INTRAVENOUS | Status: AC
Start: 2017-04-07 — End: 2017-04-07
  Filled 2017-04-07: qty 10

## 2017-04-07 MED ORDER — PHENYLEPHRINE 40 MCG/ML (10ML) SYRINGE FOR IV PUSH (FOR BLOOD PRESSURE SUPPORT)
PREFILLED_SYRINGE | INTRAVENOUS | Status: AC
  Filled 2017-04-07: qty 10

## 2017-04-07 MED ORDER — PHENYLEPHRINE HCL 10 MG/ML IJ SOLN
INTRAMUSCULAR | Status: DC | PRN
  Administered 2017-04-07: 25 ug/min via INTRAVENOUS

## 2017-04-07 MED ORDER — ONDANSETRON HCL 4 MG/2ML IJ SOLN
INTRAMUSCULAR | Status: DC | PRN
  Administered 2017-04-07: 4 mg via INTRAVENOUS

## 2017-04-07 MED ORDER — SODIUM CHLORIDE 0.9 % IJ SOLN
INTRAMUSCULAR | Status: DC | PRN
  Administered 2017-04-07: 20 mL

## 2017-04-07 MED ORDER — FENTANYL CITRATE (PF) 100 MCG/2ML IJ SOLN
25.0000 ug | INTRAMUSCULAR | Status: DC | PRN
  Administered 2017-04-07: 50 ug via INTRAVENOUS

## 2017-04-07 MED ORDER — ROCURONIUM BROMIDE 50 MG/5ML IV SOSY
PREFILLED_SYRINGE | INTRAVENOUS | Status: DC | PRN
  Administered 2017-04-07: 50 mg via INTRAVENOUS
  Administered 2017-04-07: 10 mg via INTRAVENOUS

## 2017-04-07 MED ORDER — DEXAMETHASONE SODIUM PHOSPHATE 10 MG/ML IJ SOLN
INTRAMUSCULAR | Status: DC | PRN
  Administered 2017-04-07: 10 mg via INTRAVENOUS

## 2017-04-07 MED ORDER — PHENOL 1.4 % MT LIQD: 1.0000 | OROMUCOSAL | Status: DC | PRN

## 2017-04-07 MED ORDER — BUPIVACAINE LIPOSOME 1.3 % IJ SUSP
INTRAMUSCULAR | Status: DC | PRN
Start: 2017-04-07 — End: 2017-04-07
  Administered 2017-04-07: 20 mL

## 2017-04-07 MED ORDER — ONDANSETRON HCL 4 MG/2ML IJ SOLN
INTRAMUSCULAR | Status: AC
  Filled 2017-04-07: qty 2

## 2017-04-07 MED ORDER — MIDAZOLAM HCL 2 MG/2ML IJ SOLN
INTRAMUSCULAR | Status: AC
  Filled 2017-04-07: qty 2

## 2017-04-07 MED ORDER — FENTANYL CITRATE (PF) 100 MCG/2ML IJ SOLN
INTRAMUSCULAR | Status: DC | PRN
  Administered 2017-04-07: 100 ug via INTRAVENOUS
  Administered 2017-04-07: 50 ug via INTRAVENOUS

## 2017-04-07 MED ORDER — ONDANSETRON HCL 4 MG/2ML IJ SOLN: 4.0000 mg | Freq: Four times a day (QID) | INTRAMUSCULAR | Status: DC | PRN

## 2017-04-07 MED ORDER — HYDROMORPHONE HCL 1 MG/ML IJ SOLN: 1.0000 mg | INTRAMUSCULAR | Status: DC | PRN

## 2017-04-07 MED ORDER — LEVOTHYROXINE SODIUM 25 MCG PO TABS
137.0000 ug | ORAL_TABLET | Freq: Every day | ORAL | Status: DC
  Administered 2017-04-08: 08:00:00 137 ug via ORAL
  Filled 2017-04-07: qty 1

## 2017-04-07 MED ORDER — SODIUM CHLORIDE 0.9 % IV SOLN
1000.0000 mg | INTRAVENOUS | Status: AC
  Administered 2017-04-07: 1000 mg via INTRAVENOUS
  Filled 2017-04-07: qty 10

## 2017-04-07 MED ORDER — KETOROLAC TROMETHAMINE 30 MG/ML IJ SOLN
INTRAMUSCULAR | Status: DC | PRN
  Administered 2017-04-07: 30 mg via INTRAVENOUS

## 2017-04-07 MED ORDER — FENTANYL CITRATE (PF) 100 MCG/2ML IJ SOLN
INTRAMUSCULAR | Status: AC
  Filled 2017-04-07: qty 2

## 2017-04-07 MED ORDER — MIDAZOLAM HCL 2 MG/2ML IJ SOLN
INTRAMUSCULAR | Status: DC | PRN
  Administered 2017-04-07: 2 mg via INTRAVENOUS

## 2017-04-07 MED ORDER — MENTHOL 3 MG MT LOZG: 1.0000 | LOZENGE | OROMUCOSAL | Status: DC | PRN

## 2017-04-07 MED ORDER — METOCLOPRAMIDE HCL 5 MG PO TABS: 5.0000 mg | ORAL_TABLET | Freq: Three times a day (TID) | ORAL | Status: DC | PRN

## 2017-04-07 MED ORDER — ACETAMINOPHEN 325 MG PO TABS: 650.0000 mg | ORAL_TABLET | ORAL | Status: DC | PRN

## 2017-04-07 MED ORDER — BUPIVACAINE LIPOSOME 1.3 % IJ SUSP
20.0000 mL | INTRAMUSCULAR | Status: DC
  Filled 2017-04-07: qty 20

## 2017-04-07 MED ORDER — LACTATED RINGERS IV SOLN
INTRAVENOUS | Status: DC
  Administered 2017-04-07 (×2): via INTRAVENOUS

## 2017-04-07 MED ORDER — LACTATED RINGERS IV SOLN
INTRAVENOUS | Status: DC
  Administered 2017-04-07 – 2017-04-08 (×2): via INTRAVENOUS

## 2017-04-07 MED ORDER — LIDOCAINE 2% (20 MG/ML) 5 ML SYRINGE
INTRAMUSCULAR | Status: DC | PRN
  Administered 2017-04-07: 60 mg via INTRAVENOUS

## 2017-04-07 MED ORDER — 0.9 % SODIUM CHLORIDE (POUR BTL) OPTIME
TOPICAL | Status: DC | PRN
  Administered 2017-04-07: 1000 mL

## 2017-04-07 MED ORDER — PROPOFOL 10 MG/ML IV BOLUS
INTRAVENOUS | Status: AC
Start: 2017-04-07 — End: 2017-04-07
  Filled 2017-04-07: qty 20

## 2017-04-07 MED ORDER — FENTANYL CITRATE (PF) 250 MCG/5ML IJ SOLN
INTRAMUSCULAR | Status: AC
  Filled 2017-04-07: qty 5

## 2017-04-07 MED ORDER — PROPOFOL 10 MG/ML IV BOLUS
INTRAVENOUS | Status: DC | PRN
  Administered 2017-04-07: 130 mg via INTRAVENOUS

## 2017-04-07 SURGICAL SUPPLY — 63 items
BLADE SAW SGTL 83.5X18.5 (BLADE) ×2 IMPLANT
CEMENT BONE DEPUY (Cement) ×2 IMPLANT
COVER SURGICAL LIGHT HANDLE (MISCELLANEOUS) ×2 IMPLANT
DERMABOND ADHESIVE PROPEN (GAUZE/BANDAGES/DRESSINGS) ×1
DERMABOND ADVANCED (GAUZE/BANDAGES/DRESSINGS) ×1
DERMABOND ADVANCED .7 DNX12 (GAUZE/BANDAGES/DRESSINGS) ×1 IMPLANT
DERMABOND ADVANCED .7 DNX6 (GAUZE/BANDAGES/DRESSINGS) ×1 IMPLANT
DRAPE ORTHO SPLIT 77X108 STRL (DRAPES) ×2
DRAPE SURG 17X11 SM STRL (DRAPES) ×2 IMPLANT
DRAPE SURG ORHT 6 SPLT 77X108 (DRAPES) ×2 IMPLANT
DRAPE U-SHAPE 47X51 STRL (DRAPES) ×2 IMPLANT
DRSG AQUACEL AG ADV 3.5X10 (GAUZE/BANDAGES/DRESSINGS) ×2 IMPLANT
DURAPREP 26ML APPLICATOR (WOUND CARE) ×2 IMPLANT
ELECT BLADE 4.0 EZ CLEAN MEGAD (MISCELLANEOUS) ×2
ELECT CAUTERY BLADE 6.4 (BLADE) ×2 IMPLANT
ELECT REM PT RETURN 9FT ADLT (ELECTROSURGICAL) ×2
ELECTRODE BLDE 4.0 EZ CLN MEGD (MISCELLANEOUS) ×1 IMPLANT
ELECTRODE REM PT RTRN 9FT ADLT (ELECTROSURGICAL) ×1 IMPLANT
FACESHIELD WRAPAROUND (MASK) ×6 IMPLANT
GLENDOID UNI VAULTLOCK EXLG (Shoulder) ×2 IMPLANT
GLENOID UNI VAULTLOCK EXLG (Shoulder) ×1 IMPLANT
GLOVE BIO SURGEON STRL SZ7.5 (GLOVE) ×2 IMPLANT
GLOVE BIO SURGEON STRL SZ8 (GLOVE) ×2 IMPLANT
GLOVE EUDERMIC 7 POWDERFREE (GLOVE) ×2 IMPLANT
GLOVE SS BIOGEL STRL SZ 7.5 (GLOVE) ×1 IMPLANT
GLOVE SUPERSENSE BIOGEL SZ 7.5 (GLOVE) ×1
GOWN STRL REUS W/ TWL LRG LVL3 (GOWN DISPOSABLE) ×1 IMPLANT
GOWN STRL REUS W/ TWL XL LVL3 (GOWN DISPOSABLE) ×2 IMPLANT
GOWN STRL REUS W/TWL LRG LVL3 (GOWN DISPOSABLE) ×1
GOWN STRL REUS W/TWL XL LVL3 (GOWN DISPOSABLE) ×2
HEAD HUMERAL UNIVERSAL II 52 (Shoulder) ×2 IMPLANT
KIT BASIN OR (CUSTOM PROCEDURE TRAY) ×2 IMPLANT
KIT ROOM TURNOVER OR (KITS) ×2 IMPLANT
KIT SET UNIVERSAL (KITS) ×2 IMPLANT
MANIFOLD NEPTUNE II (INSTRUMENTS) ×2 IMPLANT
NDL SUT .5 MAYO 1.404X.05X (NEEDLE) IMPLANT
NDL SUT 6 .5 CRC .975X.05 MAYO (NEEDLE) ×1 IMPLANT
NEEDLE 22X1 1/2 (OR ONLY) (NEEDLE) ×2 IMPLANT
NEEDLE MAYO TAPER (NEEDLE) ×1
NS IRRIG 1000ML POUR BTL (IV SOLUTION) ×2 IMPLANT
PACK SHOULDER (CUSTOM PROCEDURE TRAY) ×2 IMPLANT
PAD ARMBOARD 7.5X6 YLW CONV (MISCELLANEOUS) ×4 IMPLANT
RESTRAINT HEAD UNIVERSAL NS (MISCELLANEOUS) ×2 IMPLANT
SLING ARM FOAM STRAP LRG (SOFTGOODS) ×2 IMPLANT
SLING ARM IMMOBILIZER LRG (SOFTGOODS) ×2 IMPLANT
SLING ARM XL FOAM STRAP (SOFTGOODS) IMPLANT
SMARTMIX MINI TOWER (MISCELLANEOUS) ×2
SPONGE LAP 18X18 X RAY DECT (DISPOSABLE) ×2 IMPLANT
SPONGE LAP 4X18 X RAY DECT (DISPOSABLE) ×2 IMPLANT
STEM HUMERAL UNIV APEX 13MM (Stem) ×2 IMPLANT
SUCTION FRAZIER HANDLE 10FR (MISCELLANEOUS) ×1
SUCTION TUBE FRAZIER 10FR DISP (MISCELLANEOUS) ×1 IMPLANT
SUT FIBERWIRE #2 38 T-5 BLUE (SUTURE) ×12
SUT MNCRL AB 3-0 PS2 18 (SUTURE) ×2 IMPLANT
SUT MON AB 2-0 CT1 36 (SUTURE) ×2 IMPLANT
SUT VIC AB 1 CT1 27 (SUTURE) ×2
SUT VIC AB 1 CT1 27XBRD ANBCTR (SUTURE) ×2 IMPLANT
SUTURE FIBERWR #2 38 T-5 BLUE (SUTURE) ×6 IMPLANT
SYR CONTROL 10ML LL (SYRINGE) ×2 IMPLANT
TOWEL OR 17X24 6PK STRL BLUE (TOWEL DISPOSABLE) ×2 IMPLANT
TOWEL OR 17X26 10 PK STRL BLUE (TOWEL DISPOSABLE) ×2 IMPLANT
TOWER SMARTMIX MINI (MISCELLANEOUS) ×1 IMPLANT
WATER STERILE IRR 1000ML POUR (IV SOLUTION) ×2 IMPLANT

## 2017-04-07 NOTE — Transfer of Care (Signed)
Immediate Anesthesia Transfer of Care Note  Patient: Jason HomansBrooks J Milne Jr.  Procedure(s) Performed: LEFT TOTAL SHOULDER ARTHROPLASTY (Left Shoulder)  Patient Location: PACU  Anesthesia Type:General  Level of Consciousness: drowsy and patient cooperative  Airway & Oxygen Therapy: Patient Spontanous Breathing and Patient connected to face mask oxygen  Post-op Assessment: Report given to RN and Post -op Vital signs reviewed and stable  Post vital signs: Reviewed and stable  Last Vitals:  Vitals:   04/07/17 0821  BP: (!) 163/82  Pulse: 64  Resp: 18  Temp: 37.5 C  SpO2: 99%    Last Pain:  Vitals:   04/07/17 0902  TempSrc:   PainSc: 3       Patients Stated Pain Goal: 2 (04/07/17 0902)  Complications: No apparent anesthesia complications

## 2017-04-07 NOTE — Op Note (Signed)
04/07/2017  11:53 AM  PATIENT:   Jason HomansBrooks J Ammon Jr.  76 y.o. male  PRE-OPERATIVE DIAGNOSIS:  Left shoulder osteoarthritis  POST-OPERATIVE DIAGNOSIS:  same  PROCEDURE:  L TSA #13 stem, 52x22 head, XL glenoid  SURGEON:  Rilan Eiland, Vania ReaKevin M. M.D.  ASSISTANTS: Shuford pac   ANESTHESIA:   GET   EBL: 200  SPECIMEN:  none  Drains: none   PATIENT DISPOSITION:  PACU - hemodynamically stable.    PLAN OF CARE: Admit for overnight observation  Dictation# [id number]   Contact # [phone]

## 2017-04-07 NOTE — H&P (Signed)
Jason Friedman J Jason Friedman.    Chief Complaint: Left shoulder osteoarthritis HPI: The patient is a 76 y.o. male with end stage left shoulder OA  Past Medical History:  Diagnosis Date  . Anemia   . Arthritis   . Cervical spine fracture (HCC) 2  . Cervical vertebral fracture (HCC) 25 yrs ago   C 5   . 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 COPD (chronic obstructive pulmonary disease) (HCC)    mild copd per lov note dr Jason Friedman 04-16-11 on chart  . Fracture of cervical vertebrae, multiple (HCC)  in high school   2 cervical vertebrae fx, 3 dislocated cervical vertebrae  . Heart abnormality    heart anatomicall rotated, causes abnormal ekg  . Hypertension    hx on, none current  . Hypogonadism in male   . Hypothyroidism   . Impaired memory    x 2 yrs ago after neck injury  . Insomnia   . PONV (postoperative nausea and vomiting)    likes scopolamine patch, vertigo   . Prostate irregularity    nonfuctional prostate, on testerone gel bid  . Sigmoid diverticulitis   . Sleep apnea    does not use due to weight loss, does not need now, not able to use CPAP    Past Surgical History:  Procedure Laterality Date  . piliodional cyst  yrs ago   done x 3  . right shoulder arthroscopy  Mar 02, 2011  . TONSILLECTOMY    . TOTAL HIP ARTHROPLASTY  08/23/2011   Procedure: TOTAL HIP ARTHROPLASTY;  Surgeon: Jason DrillingFrank V Aluisio, MD;  Location: [facility];  Service: Orthopedics;  Laterality: Right;    Family History  Problem Relation Age of Onset  . Breast cancer Mother   . Prostate cancer Father   . Diabetes Sister   . Breast cancer Sister   . Diabetes Sister   . Colon cancer Sister     Social History:  reports that  has never smoked. he has never used smokeless tobacco. He reports that he drinks about 1.2 - 1.8 oz of alcohol per week. He reports that he does not use drugs.   Medications Prior to Admission  Medication Sig Dispense Refill  . ANDROGEL PUMP 20.25 MG/ACT  (1.62%) GEL APPLY 3 PUMPS TWICE A DAY  0  . Cholecalciferol (VITAMIN D-3) 1000 units CAPS Take 1,000 Units by mouth daily.     . Cyanocobalamin (VITAMIN B-12) 5000 MCG TBDP Take 1 tablet by mouth daily.    Marland Kitchen. HYDROcodone-acetaminophen (NORCO/VICODIN) 5-325 MG tablet Take 1 tablet by mouth every 6 (six) hours as needed for moderate pain.    Marland Kitchen. levothyroxine (SYNTHROID, LEVOTHROID) 137 MCG tablet TAKE 1 TABLET IN THE MORNING ON AN EMPTY STOMACH  3  . losartan (COZAAR) 25 MG tablet Take 25 mg by mouth at bedtime.    . vitamin E 400 UNIT capsule Take 400 Units by mouth daily.    . tadalafil (CIALIS) 5 MG tablet Take 5 mg by mouth daily as needed for erectile dysfunction.       Physical Exam: left shoulder with painful and restricted motion as noted at recent office visits  Vitals  Temp:  [99.5 F (37.5 C)] 99.5 F (37.5 C) (12/13 0821) Pulse Rate:  [64] 64 (12/13 0821) Resp:  [18] 18 (12/13 0821) BP: (163)/(82) 163/82 (12/13 0821) SpO2:  [99 %] 99 % (12/13 0821) Weight:  [78.1 kg (172 lb 1.6 oz)] 78.1  kg (172 lb 1.6 oz) (12/13 0821)  Assessment/Plan  Impression: Left shoulder osteoarthritis  Plan of Action: Procedure(s): LEFT TOTAL SHOULDER ARTHROPLASTY  Jason Friedman M Jason Friedman 04/07/2017, 9:13 AM Contact # [phone]

## 2017-04-07 NOTE — Discharge Instructions (Signed)

## 2017-04-07 NOTE — Anesthesia Preprocedure Evaluation (Addendum)
Anesthesia Evaluation  Patient identified by MRN, date of birth, ID band Patient awake    Reviewed: Allergy & Precautions, H&P , NPO status , Patient's Chart, lab work & pertinent test results  History of Anesthesia Complications (+) PONV  Airway Mallampati: I  TM Distance: >3 FB Neck ROM: Full    Dental no notable dental hx. (+) Teeth Intact, Dental Advisory Given   Pulmonary sleep apnea , COPD,    Pulmonary exam normal breath sounds clear to auscultation       Cardiovascular hypertension, On Medications  Rhythm:Regular Rate:Normal     Neuro/Psych negative neurological ROS  negative psych ROS   GI/Hepatic negative GI ROS, Neg liver ROS,   Endo/Other  Hypothyroidism   Renal/GU negative Renal ROS  negative genitourinary   Musculoskeletal  (+) Arthritis , Osteoarthritis,    Abdominal   Peds  Hematology negative hematology ROS (+) anemia ,   Anesthesia Other Findings   Reproductive/Obstetrics negative OB ROS                            Anesthesia Physical Anesthesia Plan  ASA: II  Anesthesia Plan: General   Post-op Pain Management:    Induction: Intravenous  PONV Risk Score and Plan: 4 or greater and Ondansetron, Dexamethasone and Midazolam  Airway Management Planned: Oral ETT  Additional Equipment:   Intra-op Plan:   Post-operative Plan: Extubation in OR  Informed Consent: I have reviewed the patients History and Physical, chart, labs and discussed the procedure including the risks, benefits and alternatives for the proposed anesthesia with the patient or authorized representative who has indicated his/her understanding and acceptance.   Dental advisory given  Plan Discussed with: CRNA  Anesthesia Plan Comments:        Anesthesia Quick Evaluation

## 2017-04-07 NOTE — Anesthesia Procedure Notes (Signed)
Procedure Name: Intubation Date/Time: 04/07/2017 10:09 AM Performed by: Pearson Grippeobertson, Gee Habig M, CRNA Pre-anesthesia Checklist: Patient identified, Emergency Drugs available, Suction available and Patient being monitored Patient Re-evaluated:Patient Re-evaluated prior to induction Oxygen Delivery Method: Circle system utilized Preoxygenation: Pre-oxygenation with 100% oxygen Induction Type: IV induction Ventilation: Mask ventilation without difficulty Laryngoscope Size: Glidescope and 3 Grade View: Grade I Tube type: Oral Tube size: 7.5 mm Number of attempts: 2 Airway Equipment and Method: Stylet and Oral airway Placement Confirmation: ETT inserted through vocal cords under direct vision,  positive ETCO2 and breath sounds checked- equal and bilateral Secured at: 22 cm Tube secured with: Tape Dental Injury: Teeth and Oropharynx as per pre-operative assessment  Difficulty Due To: Difficult Airway- due to anterior larynx Future Recommendations: Recommend- induction with short-acting agent, and alternative techniques readily available Comments: DL x1 Mil 3 by SRNA, grade 4 view, DL x1 Mil 3 by CRNA, grade 4 view, Glide 3 x1, grade 1 view, ATOI. +BBS/etCO2.

## 2017-04-07 NOTE — Anesthesia Postprocedure Evaluation (Signed)
Anesthesia Post Note  Patient: Jason HomansBrooks J Burek Jr.  Procedure(s) Performed: LEFT TOTAL SHOULDER ARTHROPLASTY (Left Shoulder)     Patient location during evaluation: PACU Anesthesia Type: General Level of consciousness: awake and alert Pain management: pain level controlled Vital Signs Assessment: post-procedure vital signs reviewed and stable Respiratory status: spontaneous breathing, nonlabored ventilation and respiratory function stable Cardiovascular status: blood pressure returned to baseline and stable Postop Assessment: no apparent nausea or vomiting Anesthetic complications: no    Last Vitals:  Vitals:   04/07/17 1330 04/07/17 1345  BP: 131/69 131/72  Pulse: 73 73  Resp: (!) 22 11  Temp:    SpO2: 96% 95%    Last Pain:  Vitals:   04/07/17 1300  TempSrc:   PainSc: Asleep                 Malayia Spizzirri,W. EDMOND

## 2017-04-08 ENCOUNTER — Encounter (HOSPITAL_COMMUNITY): Payer: Self-pay | Admitting: Orthopedic Surgery

## 2017-04-08 MED ORDER — OXYCODONE-ACETAMINOPHEN 5-325 MG PO TABS: 1.0000 | ORAL_TABLET | ORAL | 0 refills | Status: DC | PRN

## 2017-04-08 MED ORDER — NAPROXEN 500 MG PO TABS: 500.0000 mg | ORAL_TABLET | Freq: Two times a day (BID) | ORAL | 1 refills | Status: DC

## 2017-04-08 MED ORDER — DIAZEPAM 5 MG PO TABS: 2.5000 mg | ORAL_TABLET | Freq: Four times a day (QID) | ORAL | 1 refills | Status: DC | PRN

## 2017-04-08 MED ORDER — ONDANSETRON HCL 4 MG PO TABS: 4.0000 mg | ORAL_TABLET | Freq: Three times a day (TID) | ORAL | 0 refills | Status: DC | PRN

## 2017-04-08 NOTE — Progress Notes (Signed)
Discharge home. Home discharge instruction given, no question verbalized. 

## 2017-04-08 NOTE — Discharge Summary (Signed)
PATIENT ID:      Jason HomansBrooks J Lindler Jr.  MRN:     [id number] DOB/AGE:    Nov 20, 1940 / 76 y.o.     DISCHARGE SUMMARY  ADMISSION DATE:    04/07/2017 DISCHARGE DATE:    ADMISSION DIAGNOSIS: Left shoulder osteoarthritis Past Medical History:  Diagnosis Date  . Anemia   . Arthritis   . Cervical spine fracture (HCC) 2  . Cervical vertebral fracture (HCC) 25 yrs ago   C 5   . 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 COPD (chronic obstructive pulmonary disease) (HCC)    mild copd per lov note dr Meredeth Idefleming 04-16-11 on chart  . Fracture of cervical vertebrae, multiple (HCC)  in high school   2 cervical vertebrae fx, 3 dislocated cervical vertebrae  . Heart abnormality    heart anatomicall rotated, causes abnormal ekg  . Hypertension    hx on, none current  . Hypogonadism in male   . Hypothyroidism   . Impaired memory    x 2 yrs ago after neck injury  . Insomnia   . PONV (postoperative nausea and vomiting)    likes scopolamine patch, vertigo   . Prostate irregularity    nonfuctional prostate, on testerone gel bid  . Sigmoid diverticulitis   . Sleep apnea    does not use due to weight loss, does not need now, not able to use CPAP    DISCHARGE DIAGNOSIS:   Active Problems:   Status post total shoulder arthroplasty   PROCEDURE: Procedure(s): LEFT TOTAL SHOULDER ARTHROPLASTY on 04/07/2017  CONSULTS:    HISTORY:  See H&P in chart.  HOSPITAL COURSE:  Jason HomansBrooks J Handel Jr. is a 76 y.o. admitted on 04/07/2017 with a diagnosis of Left shoulder osteoarthritis.  They were brought to the operating room on 04/07/2017 and underwent Procedure(s): LEFT TOTAL SHOULDER ARTHROPLASTY.    They were given perioperative antibiotics:  Anti-infectives (From admission, onward)   Start     Dose/Rate Route Frequency Ordered Stop   04/07/17 1600  clindamycin (CLEOCIN) IVPB 600 mg     600 mg 100 mL/hr over 30 Minutes Intravenous Every 6 hours 04/07/17 1519 04/08/17 0404   04/07/17 0845  clindamycin (CLEOCIN) 900 MG/50ML IVPB    Comments:  Domenica FailJones, Tomika   : cabinet override      04/07/17 0845 04/07/17 1012   04/07/17 0838  clindamycin (CLEOCIN) IVPB 900 mg     900 mg 100 mL/hr over 30 Minutes Intravenous On call to O.R. 04/07/17 [id number] 04/07/17 1042    .  Patient underwent the above named procedure and tolerated it well. The following day they were hemodynamically stable and pain was controlled on oral analgesics. They were neurovascularly intact to the operative extremity. OT was ordered and worked with patient per protocol. They were medically and orthopaedically stable for discharge on day 1 .    DIAGNOSTIC STUDIES:  RECENT RADIOGRAPHIC STUDIES :  No results found.  RECENT VITAL SIGNS:   Patient Vitals for the past 24 hrs:  BP Temp Temp src Pulse Resp SpO2 Height Weight  04/08/17 0637 129/69 98.1 F (36.7 C) Oral 79 15 99 % - -  04/08/17 0016 122/67 97.9 F (36.6 C) Oral 89 16 99 % - -  04/07/17 1955 123/72 98.7 F (37.1 C) Oral (!) 102 17 97 % - -  04/07/17 1630 137/74 98.2 F (36.8 C) Oral 93 18 100 % - -  04/07/17 1536 (!) 156/73  98.2 F (36.8 C) Oral 92 18 100 % - -  04/07/17 1345 131/72 - - 73 11 95 % - -  04/07/17 1330 131/69 - - 73 (!) 22 96 % - -  04/07/17 1315 140/76 - - 73 14 96 % - -  04/07/17 1300 132/73 - - 81 15 97 % - -  04/07/17 1245 133/79 - - 77 14 96 % - -  04/07/17 1230 139/77 - - 81 19 96 % - -  04/07/17 1215 137/77 97.6 F (36.4 C) - 85 16 100 % - -  04/07/17 0821 (!) 163/82 99.5 F (37.5 C) Oral 64 18 99 % 5' 8.5" (1.74 m) 78.1 kg (172 lb 1.6 oz)  .  RECENT EKG RESULTS:    Orders placed or performed in visit on 05/14/16  . EKG 12-Lead    DISCHARGE INSTRUCTIONS:    DISCHARGE MEDICATIONS:   Allergies as of 04/08/2017      Reactions   Duloxetine Other (See Comments)   Altered personality - didn't eat, pain all over   Cefuroxime Axetil [ceftin] Other (See Comments)   Joint pain   Gabapentin Other (See  Comments)   Visual disturbance    Latex    Skin irritation    Shellfish Allergy Swelling   Sulfa Antibiotics       Medication List    STOP taking these medications   HYDROcodone-acetaminophen 5-325 MG tablet Commonly known as:  NORCO/VICODIN     TAKE these medications   ANDROGEL PUMP 20.25 MG/ACT (1.62%) Gel Generic drug:  Testosterone APPLY 3 PUMPS TWICE A DAY   diazepam 5 MG tablet Commonly known as:  VALIUM Take 0.5-1 tablets (2.5-5 mg total) by mouth every 6 (six) hours as needed for muscle spasms or sedation.   levothyroxine 137 MCG tablet Commonly known as:  SYNTHROID, LEVOTHROID TAKE 1 TABLET IN THE MORNING ON AN EMPTY STOMACH   losartan 25 MG tablet Commonly known as:  COZAAR Take 25 mg by mouth at bedtime.   naproxen 500 MG tablet Commonly known as:  NAPROSYN Take 1 tablet (500 mg total) by mouth 2 (two) times daily with a meal.   ondansetron 4 MG tablet Commonly known as:  ZOFRAN Take 1 tablet (4 mg total) by mouth every 8 (eight) hours as needed for nausea or vomiting.   oxyCODONE-acetaminophen 5-325 MG tablet Commonly known as:  PERCOCET Take 1 tablet by mouth every 4 (four) hours as needed.   tadalafil 5 MG tablet Commonly known as:  CIALIS Take 5 mg by mouth daily as needed for erectile dysfunction.   Vitamin B-12 5000 MCG Tbdp Take 1 tablet by mouth daily.   Vitamin D-3 1000 units Caps Take 1,000 Units by mouth daily.   vitamin E 400 UNIT capsule Take 400 Units by mouth daily.       FOLLOW UP VISIT:   Follow-up Information    Francena HanlySupple, Kevin, MD.   Specialty:  Orthopedic Surgery Why:  call to be seen in 10-14 days Contact information: [address] [place] [address] [phone]           DISCHARGE TO: Home   DISCHARGE CONDITION:  Rodolph BongGood   Lezette Kitts for Dr. Francena HanlyKevin Supple 04/08/2017, 7:08 AM

## 2017-04-08 NOTE — Evaluation (Signed)
Occupational Therapy Evaluation and Discharge Patient Details Name: Jason HomansBrooks J Drewes Jr. MRN: [id number] DOB: [birth year] Today's Date: 04/08/2017    History of Present Illness LEFT TOTAL SHOULDER ARTHROPLASTY    Clinical Impression   This 76 yo male admitted and underwent above presents to acute OT with all OT education completed, we will D/C from acute OT.    Follow Up Recommendations  DC plan and follow up therapy as arranged by surgeon    Equipment Recommendations  None recommended by OT       Precautions / Restrictions Precautions Precautions: Shoulder Shoulder Interventions: Shoulder sling/immobilizer;Off for dressing/bathing/exercises(can have sling off in controlled environment (ie: watching TV, eating) so neck can have rest from strap. Sleep in sling x 4 weeks) Precaution Comments: Ok to use operative arm to assist in feeding, bathing, and ADLs; PROM restrictions for use in hygiene and ADL only (ER 20, ABD 45, FF 60); AROM elbow, wrist, and hand OK; OK for gentle pendulums; pt may shower Restrictions Weight Bearing Restrictions: Yes LUE Weight Bearing: Non weight bearing      Mobility Bed Mobility Overal bed mobility: Independent                Transfers Overall transfer level: Independent                        ADL either performed or assessed with clinical judgement         Vision Patient Visual Report: No change from baseline              Pertinent Vitals/Pain Pain Assessment: 0-10 Pain Score: 2  Pain Location: left shoulder Pain Descriptors / Indicators: Aching;Sore Pain Intervention(s): Limited activity within patient's tolerance;Monitored during session;Repositioned     Hand Dominance Right   Extremity/Trunk Assessment Upper Extremity Assessment Upper Extremity Assessment: LUE deficits/detail LUE Deficits / Details: shoulder sx this admission; decreased supination as well. Rest AROM elbow and distally WNL            Communication Communication Communication: No difficulties   Cognition Arousal/Alertness: Awake/alert Behavior During Therapy: WFL for tasks assessed/performed Overall Cognitive Status: Within Functional Limits for tasks assessed                                        Exercises Other Exercises Other Exercises: Pt completed 10 reps of all 4 pendulums, elbow, forearm, wrist and hand exercises   Shoulder Instructions Shoulder Instructions Donning/doffing shirt without moving shoulder: Caregiver independent with task Method for sponge bathing under operated UE: Caregiver independent with task(pt verbalized understanding) Donning/doffing sling/immobilizer: Caregiver independent with task Correct positioning of sling/immobilizer: Caregiver independent with task Pendulum exercises (written home exercise program): Supervision/safety ROM for elbow, wrist and digits of operated UE: Supervision/safety Sling wearing schedule (on at all times/off for ADL's): (pt verbalized understanding) Proper positioning of operated UE when showering: (pt verbalized understanding) Dressing change: (NA) Positioning of UE while sleeping: Caregiver independent with task(pt verbalized understanding)    Home Living Family/patient expects to be discharged to:: Private residence Living Arrangements: Spouse/significant other Available Help at Discharge: Family;Available 24 hours/day                             Additional Comments: Very activie      Prior Functioning/Environment Level of Independence: Independent  OT Problem List: Decreased strength;Decreased range of motion;Impaired UE functional use;Pain         OT Goals(Current goals can be found in the care plan section) Acute Rehab OT Goals Patient Stated Goal: home today  OT Frequency:                AM-PAC PT "6 Clicks" Daily Activity     Outcome Measure Help from another person eating meals?: A  Little Help from another person taking care of personal grooming?: A Little Help from another person toileting, which includes using toliet, bedpan, or urinal?: A Little Help from another person bathing (including washing, rinsing, drying)?: A Little Help from another person to put on and taking off regular upper body clothing?: A Lot Help from another person to put on and taking off regular lower body clothing?: A Lot 6 Click Score: 16   End of Session Equipment Utilized During Treatment: (sling) Nurse Communication: (pt ready to go from therapy standpoint)  Activity Tolerance: Patient tolerated treatment well Patient left: in chair;with family/visitor present  OT Visit Diagnosis: Pain Pain - Right/Left: Left Pain - part of body: Shoulder                Time: [id number] OT Time Calculation (min): 41 min Charges:  OT General Charges $OT Visit: 1 Visit OT Evaluation $OT Eval Moderate Complexity: 1 Mod OT Treatments $Self Care/Home Management : 8-22 mins $Therapeutic Exercise: 8-22 mins Ignacia PalmaCathy Laiyah Exline, OTR/L [phone] 04/08/2017

## 2017-04-08 NOTE — Op Note (Signed)
NAME:  Jason Friedman, Hamp                   ACCOUNT NO.:  MEDICAL RECORD NO.:  [id number]  LOCATION:                                 FACILITY:  PHYSICIAN:  Jason ReaKevin M. Murray Durrell, M.D.       DATE OF BIRTH:  DATE OF PROCEDURE:  04/07/2017 DATE OF DISCHARGE:                              OPERATIVE REPORT   PREOPERATIVE DIAGNOSIS:  End-stage left shoulder osteoarthritis.  POSTOPERATIVE DIAGNOSIS:  End-stage left shoulder osteoarthritis.  PROCEDURE:  Left total shoulder arthroplasty utilizing a press-fit size 13 stem, a 52 x 22 eccentric head, and an extra large glenoid.  SURGEON:  Jason ReaKevin M. Alayjah Boehringer, M.D.  ASSISTANT:  Jason Friedman, P.A.-C.  ANESTHESIA:  General endotracheal.  No interscalene block was used due to previous right hemidiaphragm paralysis.  ESTIMATED BLOOD LOSS:  200 mL.  DRAINS:  None.  HISTORY:  Jason Friedman is a 76 year old gentleman, who has had chronic and progressive increasing left shoulder pain and functional limitations related to end-stage osteoarthritis.  Plain radiographs confirmed complete obliteration of joint space, with subchondral sclerosis, large peripheral osteophytes.  Due to his ongoing pain and functional limitations, he is brought to the operating room at this time, for planned left total shoulder arthroplasty.  Preoperatively, I counseled Jason Friedman regarding treatment options and potential risks versus benefits thereof.  Possible surgical complications were reviewed including bleeding, infection, neurovascular injury, persistent pain, loss of motion, anesthetic complication, failure of the implant, possible need for additional surgery.  He understands and accepts and agrees with the planned procedure.  PROCEDURE IN DETAIL:  After undergoing routine preop evaluation, the patient received prophylactic antibiotics, brought to the operating room, placed supine on operating table, underwent smooth induction of a general endotracheal anesthesia.   Placed in the beach-chair position and appropriately padded and protected.  Left shoulder girdle region was sterilely prepped and draped in standard fashion.  Time-out was called. An anterior deltopectoral approach to the left shoulder was made through an 8 cm incision.  Skin flaps were elevated.  Dissection carried deep through the deltopectoral interval, was then developed from proximal to distal with the vein taken laterally.  Upper centimeter of the pec major was tenotomized to improve exposure.  Adhesions divided beneath the deltoid.  Conjoint tendon was mobilized and retracted medially.  Biceps tendon was then tenodesed at the upper border of the pec major, tenotomized, and the proximal segment was then excised.  We then divided the subscapularis away from the lesser tuberosity leaving an approximately 1 cm cuff of tissue for later repair and also split the rotator cuff to the base of the coracoid along the rotator interval. The free margin of the subscapularis was then tagged with a series of four #2 FiberWire grasping sutures.  We then divided the capsular attachments from the anterior-inferior and inferior aspects of the humeral neck allowing delivery of the humeral head through the wound. We outlined our proposed humeral head resection, which we performed with an oscillating saw maintaining the native retroversion approximately 30 degrees and carefully protecting the rotator cuff.  We then removed the osteophytes from the anterior and inferior margins of the humeral neck and at  this point, we gained access to the humeral canal with hand reaming up to size 7, broached up to size 13 with excellent fit and fixation.  Size 12 trial with metal cap placed over the proximal humeral surface.  We then exposed the glenoid with combination of Fukuda, pitchfork, and snake tongue retractors.  I performed a circumferential labral resection gaining complete visualization of the periphery of  the glenoid.  This was sized at an extra-large.  A guidepin was then placed into the center of the glenoid.  We reamed the glenoid to stable subchondral bony bed.  Residual debris at the margin of the glenoid was then removed.  I placed a central drill hole, followed by the inferior and superior peg and slot respectively.  The glenoid was then broached, trial showed excellent fit.  The glenoid was then irrigated, cleaned, and dried.  We mixed cement and introduced this into the superior and inferior peg and slot respectively, and the extra large glenoid was then impacted with excellent fit and fixation.  At this point, we mobilized the subscapularis and found a very large approximately 3 x 3 x 4 cm loose body in the subscapular recess and this was removed.  Subscap was mobilized after removal of this large loose body.  At this point, we then returned our attention to the proximal humerus where the trial was removed.  We impacted the size 13 stem with excellent interference fit and fixation.  The proximal locking screws were then tightened.  We then performed a series of trial reductions and felt that the 52 x 22 eccentric head gave us the best coverage with soft tissue balance allowing 50% translation.  The trial was removed.  The Morse taper was cleaned and dried.  The final 52 x 22 head was then impacted.  Final reduction was performed with excellent soft tissue balance, good stability much to our satisfaction.  At this point, the subscapularis was then repaired back to the lesser tuberosity using #2 FiberWires in a horizontal mattress pattern and the rotator interval was also repaired with figure-of-eight #2 FiberWire sutures and over sewed portions of the subscap repair as well with #2 FiberWire.  At this point, the shoulder easily demonstrated 45 degrees of external rotation without excessive tension on the subscap repair.  I should mention that the cephalic vein was compromised, so  we did ligate this at the distal end of our incision.  The wound was then copiously irrigated.  Hemostasis was obtained.  The deltopectoral interval was then reapproximated with a series of figure-of-eight #1 Vicryl sutures.  2-0 Vicryl used for the subcu layer, intracuticular 3-0 Monocryl for the skin, followed by Dermabond and Aquacel dressing.  Left arm was placed into a sling.  The patient is awakened, extubated, and taken to the recovery room in stable condition.  Jason Friedman, P.A.-C, was used as an Geophysicist/field seismologistassistant throughout this case, essential for help with positioning the patient, positioning the extremity, tissue manipulation, suture management, implantation of prosthesis, wound closure, and intraoperative decision making.     Jason Friedman, M.D.     KMS/MEDQ  D:  04/07/2017  T:  04/07/2017  Job:  [id number]

## 2018-02-21 ENCOUNTER — Ambulatory Visit (INDEPENDENT_AMBULATORY_CARE_PROVIDER_SITE_OTHER): Payer: Medicare Other | Admitting: Neurology

## 2018-02-21 ENCOUNTER — Telehealth: Payer: Self-pay | Admitting: Neurology

## 2018-02-21 ENCOUNTER — Encounter: Payer: Self-pay | Admitting: Neurology

## 2018-02-21 VITALS — BP 157/80 | HR 72 | Ht 69.0 in | Wt 176.0 lb

## 2018-02-21 DIAGNOSIS — R531 Weakness: Secondary | ICD-10-CM

## 2018-02-21 DIAGNOSIS — R202 Paresthesia of skin: Secondary | ICD-10-CM

## 2018-02-21 DIAGNOSIS — G1220 Motor neuron disease, unspecified: Secondary | ICD-10-CM | POA: Diagnosis not present

## 2018-02-21 DIAGNOSIS — R253 Fasciculation: Secondary | ICD-10-CM | POA: Diagnosis not present

## 2018-02-21 DIAGNOSIS — R200 Anesthesia of skin: Secondary | ICD-10-CM

## 2018-02-21 DIAGNOSIS — R292 Abnormal reflex: Secondary | ICD-10-CM | POA: Diagnosis not present

## 2018-02-21 DIAGNOSIS — R269 Unspecified abnormalities of gait and mobility: Secondary | ICD-10-CM

## 2018-02-21 NOTE — Telephone Encounter (Signed)
[employer] Medicare order sent to [facility]. No auth they will reach out to the pt to schedule.  °
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
## 2018-02-21 NOTE — Telephone Encounter (Signed)
Patient is aware of this he has their number of [phone] and to give them a call if he has not heard in the next 2-3 business days.

## 2018-02-21 NOTE — Patient Instructions (Addendum)
We will investigate your muscle weakness with further testing:  Lumbar spine MRI, you do have a history of multilevel degenerative changes, which we saw in Jan 2017.  We will do an EMG and nerve conduction velocity test, which is an electrical nerve and muscle test, which we will schedule. We will call you with the results. Please check with Dr. Kevan Ny about checking your B12.

## 2018-02-25 ENCOUNTER — Ambulatory Visit
Admission: RE | Admit: 2018-02-25 | Discharge: 2018-02-25 | Disposition: A | Discharge status: Home / Self Care | Payer: Medicare Other | Source: Ambulatory Visit | Attending: Neurology | Admitting: Neurology

## 2018-02-25 DIAGNOSIS — G1220 Motor neuron disease, unspecified: Secondary | ICD-10-CM

## 2018-02-28 ENCOUNTER — Telehealth: Payer: Self-pay

## 2018-02-28 DIAGNOSIS — M51369 Other intervertebral disc degeneration, lumbar region without mention of lumbar back pain or lower extremity pain: Secondary | ICD-10-CM

## 2018-02-28 DIAGNOSIS — R937 Abnormal findings on diagnostic imaging of other parts of musculoskeletal system: Secondary | ICD-10-CM

## 2018-02-28 DIAGNOSIS — M5136 Other intervertebral disc degeneration, lumbar region: Secondary | ICD-10-CM

## 2018-02-28 NOTE — Progress Notes (Signed)
Please advise patient, that his recent lumbar spine MRI showed advanced degenerative changes at multiple levels, particularly at level L4-5 his degenerative changes have progressed compared to 2017. I would recommend that he consult with his spine doctor. If he needs a referral, we can assist.  We will proceed with EMG and nerve conduction testing as scheduled for later this month.  Janene Harvey

## 2018-02-28 NOTE — Telephone Encounter (Signed)
I called pt and discussed MRI results with the pt. Pt is asking a referral to a spine doctor. I reminded pt of his EMG/NCV appt later this month. Pt verbalized understanding of results. Pt had no questions at this time but was encouraged to call back if questions arise.  Received VO from Dr. Frances Furbish to refer pt to orthopedics d/t lumbar degenerative changes.

## 2018-02-28 NOTE — Telephone Encounter (Signed)
Received lab work from Dr. Kevan Ny office, [facility] [place].   Lab: Acetylcholine Receptor AB, all Result: Negative

## 2018-02-28 NOTE — Telephone Encounter (Signed)
-----   Message from Huston Foley, MD sent at 02/28/2018 12:01 PM EST ----- Please advise patient, that his recent lumbar spine MRI showed advanced degenerative changes at multiple levels, particularly at level L4-5 his degenerative changes have progressed compared to 2017. I would recommend that he consult with his spine doctor. If he needs a referral, we can assist.  We will proceed with EMG and nerve conduction testing as scheduled for later this month.  Janene Harvey

## 2018-03-09 ENCOUNTER — Ambulatory Visit (INDEPENDENT_AMBULATORY_CARE_PROVIDER_SITE_OTHER): Payer: Medicare Other

## 2018-03-09 ENCOUNTER — Encounter (INDEPENDENT_AMBULATORY_CARE_PROVIDER_SITE_OTHER): Payer: Self-pay | Admitting: Orthopaedic Surgery

## 2018-03-09 ENCOUNTER — Ambulatory Visit
Admission: RE | Admit: 2018-03-09 | Discharge: 2018-03-09 | Disposition: A | Discharge status: Home / Self Care | Payer: Medicare Other | Source: Ambulatory Visit | Attending: Orthopaedic Surgery | Admitting: Orthopaedic Surgery

## 2018-03-09 ENCOUNTER — Ambulatory Visit (INDEPENDENT_AMBULATORY_CARE_PROVIDER_SITE_OTHER): Payer: Medicare Other | Admitting: Orthopaedic Surgery

## 2018-03-09 VITALS — BP 127/71 | HR 89 | Ht 69.0 in | Wt 173.0 lb

## 2018-03-09 DIAGNOSIS — M4712 Other spondylosis with myelopathy, cervical region: Secondary | ICD-10-CM | POA: Diagnosis not present

## 2018-03-09 DIAGNOSIS — M542 Cervicalgia: Secondary | ICD-10-CM | POA: Diagnosis not present

## 2018-03-09 NOTE — Progress Notes (Addendum)
Office Visit Note   Patient: Jason Friedman.           Date of Birth: [birth year]           MRN: [id number] Visit Date: 03/09/2018              Requested by: Marden Noble, MD [address] [place], [place] [address] PCP: Marden Noble, MD   Assessment & Plan: Visit Diagnoses:  1. Neck pain   2. Other spondylosis with myelopathy, cervical region     Plan: 2 to 55-month history of progressive myelopathy with gait disturbance hyperreflexia lower extremity clonus and old traumatic injury to cervical spine with progressive spondylosis.  I reviewed with patient and his wife his lumbar MRI which shows some generative anterolisthesis of 3 mm at L4-5 with by foraminal narrowing at L4-5 and L5-S1 but no more than moderate central compression.  Patient needs an urgent cervical MRI scan to evaluate him for myelopathic changes in his cord and cord compression.  Thank you for the opportunity to evaluate the patient and patient will return after MRI scan.  Follow-Up Instructions: No follow-ups on file.   Orders:  Orders Placed This Encounter  Procedures  . XR Cervical Spine 2 or 3 views  . MR Cervical Spine w/o contrast   No orders of the defined types were placed in this encounter.     Procedures: No procedures performed   Clinical Data: No additional findings.   Subjective: Chief Complaint  Patient presents with  . Lower Back - Pain    HPI 77 year old male referred by [facility] neurology for evaluation of cervical spine problems with progressive gait problems with giving way bilateral lower extremity numbness and tingling.  He had recent carpal tunnel release 01/02/2018 and states his surgeon felt that there was minimal carpal tunnel compression at that time but he did have positive nerve conduction velocities preoperatively.  He said loss of balance has had some falls.  He took prednisone 5 mg 3 times daily x2 weeks also tizanidine Naprosyn and hydrocodone.  Wife is  with him today and she is concerned that some this might have been related to the short anesthetic with the carpal tunnel release.  He states left hand is not any better since surgery and he also has numbness and tingling in his ring and small finger.  Past history of cervical spine fractures in the past.  He is also had some ongoing low back pain.  He states when he flexes his neck he feels sharp electrical symptoms that radiate to his lower back.  He has a cane and states that if he has not hanging onto something he is liable to fall.  Wife states that 6 months ago he was walking normally with no weakness no balance problems.  He denies vertigo.  No fever chills or fever.  Previous shoulder arthroplasty Dr. Rennis Chris December 2018 doing well.  Right total hip arthroplasty by Dr. Lequita Halt  2013 without problems.  No bowel bladder symptoms.  Bilateral stocking distribution numbness in his feet.   Review of Systems positive for neck fracture playing football in high school conservatively treated.  Head injury when a bucket fell on his left side of his head.  TSH and B12 recently tested and normal.  Negative history of TIA or CVA.  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  Positive for glaucoma hypertension sleep apnea thyroid problems.  Since non-smoker.   Objective: Vital  Signs: BP 127/71   Pulse 89   Ht 5\' 9"  (1.753 m)   Wt 173 lb (78.5 kg)   BMI 25.55 kg/m   Physical Exam  Constitutional: He is oriented to person, place, and time. He appears well-developed and well-nourished.  HENT:  Head: Normocephalic and atraumatic.  Eyes: Pupils are equal, round, and reactive to light. EOM are normal.  Neck: No tracheal deviation present. No thyromegaly present.  Cardiovascular: Normal rate.  Pulmonary/Chest: Effort normal. He has no wheezes.  Abdominal: Soft. Bowel sounds are normal.  Neurological: He is alert and oriented to person, place, and time.  Skin: Skin is warm  and dry. Capillary refill takes less than 2 seconds.  Psychiatric: He has a normal mood and affect. His behavior is normal. Judgment and thought content normal.    Ortho Exam patient has difficulty ambulating short stride gait ambulates with left knee slightly flexed poor balance.  For the wall when he turns or rotates.  Positive Lhermitte severe left brachial plexus tenderness moderate to medium on the right.  Patient has 3+ triceps reflex 1+ right and left biceps and 1+ brachial radialis.  Negative impingement right or left shoulder.  There is left greater than right triceps weakness.  Weakness left wrist flexion more than right.  Bilateral 3-4+ knee jerk and bilateral clonus at the ankle with 4+ reflex right and left.  Good lower extremity weakness.  Patient has left triceps atrophy versus right.  Specialty Comments:  No specialty comments available.  Imaging: [facility] [address], [address] [place], [place] [address] [phone]  NEUROIMAGING REPORT    STUDY DATE: 02/25/2018 PATIENT NAME: Jason HomansBrooks J Bines Jr. DOB: 06/09/1940 MRN: [id number]  EXAM: MRI of the lumbar spine without contrast  ORDERING CLINICIAN: Huston FoleySaima Athar, MD, PhD CLINICAL HISTORY: 77 year old man with weakness, motor neuron disease COMPARISON FILMS: MRI 05/22/2015  TECHNIQUE: MRI of the lumbar spine was obtained utilizing 4 mm sagittal slices from T11-12 down to the lower sacrum with T1, T2 and inversion recovery views. In addition 4 mm axial slices from L1-2 down to L5-S1 level were included with T1 and T2 weighted views. CONTRAST: None IMAGING SITE: [facility], [address], [place], [place]  FINDINGS: On sagittal images, the spine is imaged from T11 to the sacrum.   The conus medullaris and cauda equine appear normal.     There is 3 mm of anterolisthesis of L4 upon L5.  There is moderately severe to severe loss of disc height at T11-T12, L1-L2, L2-L3 and L5-S1.  Mild edema  is noted in the endplates adjacent to T11-T12 and L5-S1.  A Schmorl's node is noted in the inferior endplate of L1.    The discs and interspaces were further evaluated on axial views from L1 to S1 as follows:  T12-L1: There is mild right facet hypertrophy and right ligamenta flava hypertrophy.  There is no spinal stenosis or significant foraminal narrowing or lateral recess stenosis.  There is no nerve root compression.  L1-L2:   There is broad disc protrusion, mild facet hypertrophy.  There is mild bilateral foraminal narrowing and mild to moderate bilateral lateral recess stenosis but no nerve root compression.  L2-L3:   There is disc protrusion to the left, endplate spurring and facet hypertrophy and left ligamenta flava hypertrophy.  These combine to cause moderate left foraminal narrowing and severe left lateral recess stenosis that could compress the left L3 nerve root  L3-L4: There is disc protrusion more to the left,  facet hypertrophy and endplate spurring causing moderate left foraminal narrowing and mild to moderate left lateral recess stenosis.  There did not appear to be nerve root compression or spinal stenosis.  L4-L5: There is mild spinal stenosis due to a combination of anterolisthesis of L4 upon L5, severe facet hypertrophy, bulging of the uncovered disc and ligamenta flava hypertrophy.  There is moderately severe right foraminal narrowing and left lateral recess stenosis with moderate right lateral recess stenosis.  There is potential for right L4 and left L5 nerve root compression.  The degenerative changes at this level have progressed when compared to the 05/22/2015 MRI.  L5-S1: There is almost complete loss of disc height associated with endplate spurring and facet hypertrophy.  There is moderately severe bilateral foraminal narrowing, mild to moderate left and mild right lateral recess stenosis.  There is potential for compression of either of the L5 nerve roots.  When  compared to the MRI dated 05/22/2015, the degenerative changes at L4-L5 have mildly progressed while the other levels are essentially unchanged.   IMPRESSION: This MRI of the lumbar spine without contrast shows advanced multilevel degenerative changes as detailed above.  Most significant findings are: 1.    At L2-L3, there are degenerative changes more to the left causing severe left lateral recess stenosis that could compress the left L3 nerve root. 2.    At L3-L4, there are degenerative changes more to the left causing moderate left foraminal narrowing and mild to moderate left lateral recess stenosis but no definite nerve root compression. 3.    At L4-L5, there is 3 mm of anterolisthesis, severe facet hypertrophy, ligamenta flava hypertrophy and disc bulging causing mild spinal stenosis and moderately severe right foraminal narrowing and moderately severe left lateral recess stenosis.  There is potential for right L4 and left L5 nerve root compression. 4.    At L5-S1, there are degenerative changes causing moderately severe bilateral foraminal narrowing with potential to compress either of the L5 nerve roots. 5.    Compared to the MRI dated 05/22/2015, degenerative changes at L4-L5 have progressed well other levels are essentially unchanged.     INTERPRETING PHYSICIAN:  Richard A. Epimenio Foot, MD, PhD, FAAN Certified in  Neuroimaging by [employer]    PMFS History: Patient Active Problem List   Diagnosis Date Noted  . 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 OA (osteoarthritis) of hip 08/23/2011   Past Medical History:  Diagnosis Date  . Anemia   . Arthritis   . Cervical spine fracture (HCC) 2  . Cervical vertebral fracture (HCC) 25 yrs ago   C 5   . 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 COPD (chronic obstructive pulmonary disease) (HCC)    mild copd per lov note dr Meredeth Ide 04-16-11 on chart  . Fracture of cervical  vertebrae, multiple (HCC)  in high school   2 cervical vertebrae fx, 3 dislocated cervical vertebrae  . Heart abnormality    heart anatomicall rotated, causes abnormal ekg  . Hypertension    hx on, none current  . Hypogonadism in male   . Hypothyroidism   . Impaired memory    x 2 yrs ago after neck injury  . Insomnia   . PONV (postoperative nausea and vomiting)    likes scopolamine patch, vertigo   . Prostate irregularity    nonfuctional prostate, on testerone gel bid  . Sigmoid diverticulitis   . Sleep apnea  does not use due to weight loss, does not need now, not able to use CPAP    Family History  Problem Relation Age of Onset  . Breast cancer Mother   . Prostate cancer Father   . Diabetes Sister   . Breast cancer Sister   . Diabetes Sister   . Colon cancer Sister     Past Surgical History:  Procedure Laterality Date  . piliodional cyst  yrs ago   done x 3  . right shoulder arthroscopy  Mar 02, 2011  . TONSILLECTOMY    . TOTAL HIP ARTHROPLASTY  08/23/2011   Procedure: TOTAL HIP ARTHROPLASTY;  Surgeon: Loanne Drilling, MD;  Location: [facility];  Service: Orthopedics;  Laterality: Right;  . TOTAL SHOULDER ARTHROPLASTY Left 04/07/2017   Procedure: LEFT TOTAL SHOULDER ARTHROPLASTY;  Surgeon: Francena Hanly, MD;  Location: [facility] OR;  Service: Orthopedics;  Laterality: Left;   Social History   Occupational History  . Occupation: Retired  Tobacco Use  . Smoking status: Never Smoker  . Smokeless tobacco: Never Used  Substance and Sexual Activity  . Alcohol use: Yes    Alcohol/week: 2.0 - 3.0 standard drinks    Types: 2 - 3 Cans of beer per week  . Drug use: No  . Sexual activity: Not on file

## 2018-03-10 ENCOUNTER — Ambulatory Visit (INDEPENDENT_AMBULATORY_CARE_PROVIDER_SITE_OTHER): Payer: Medicare Other | Admitting: Orthopaedic Surgery

## 2018-03-10 ENCOUNTER — Encounter (INDEPENDENT_AMBULATORY_CARE_PROVIDER_SITE_OTHER): Payer: Self-pay | Admitting: Orthopaedic Surgery

## 2018-03-10 VITALS — BP 145/82 | HR 75 | Ht 69.0 in | Wt 173.0 lb

## 2018-03-10 DIAGNOSIS — M4712 Other spondylosis with myelopathy, cervical region: Secondary | ICD-10-CM | POA: Diagnosis not present

## 2018-03-13 ENCOUNTER — Encounter (INDEPENDENT_AMBULATORY_CARE_PROVIDER_SITE_OTHER): Payer: Self-pay | Admitting: Orthopaedic Surgery

## 2018-03-13 NOTE — Pre-Procedure Instructions (Signed)
Jason HomansBrooks J Guidroz Friedman.  03/13/2018      [id number] [place]- [place], [place] - [address] [place] [place] [id number] Phone: [phone] Fax: [phone]    Your procedure is scheduled on November 20th.  [facility] at 1245 P.M.  Call this number if you have problems the morning of surgery:  [phone]   Remember:  Do not eat or drink after midnight.    Take these medicines the morning of surgery with A SIP OF WATER   Brinzolamide-Brimonidine (SIMBRINZA)   cromolyn (OPTICROM) 4 % ophthalmic solution  HYDROcodone-acetaminophen (NORCO/VICODIN) if needed  levothyroxine (SYNTHROID, LEVOTHROID)  meclizine (ANTIVERT) if needed  predniSONE (DELTASONE)   7 days prior to surgery STOP taking any Aspirin(unless otherwise instructed by your surgeon), Aleve, Naproxen, Ibuprofen, Motrin, Advil, Goody's, BC's, all herbal medications, fish oil, and all vitamins     Do not wear jewelry.  Do not wear lotions, powders, or colognes, or deodorant.  Men may shave face and neck.  Do not bring valuables to the hospital.  [facility] is not responsible for any belongings or valuables.  Contacts, dentures or bridgework may not be worn into surgery.  Leave your suitcase in the car.  After surgery it may be brought to your room.  For patients admitted to the hospital, discharge time will be determined by your treatment team.  Patients discharged the day of surgery will not be allowed to drive home.    [place]- Preparing For Surgery  Before surgery, you can play an important role. Because skin is not sterile, your skin needs to be as free of germs as possible. You can reduce the number of germs on your skin by washing with CHG (chlorahexidine gluconate) Soap before surgery.  CHG is an antiseptic cleaner which kills germs and bonds with the skin to continue killing germs even after washing.    Oral Hygiene is also important to reduce your  risk of infection.  Remember - BRUSH YOUR TEETH THE MORNING OF SURGERY WITH YOUR REGULAR TOOTHPASTE  Please do not use if you have an allergy to CHG or antibacterial soaps. If your skin becomes reddened/irritated stop using the CHG.  Do not shave (including legs and underarms) for at least 48 hours prior to first CHG shower. It is OK to shave your face.  Please follow these instructions carefully.   1. Shower the NIGHT BEFORE SURGERY and the MORNING OF SURGERY with CHG.   2. If you chose to wash your hair, wash your hair first as usual with your normal shampoo.  3. After you shampoo, rinse your hair and body thoroughly to remove the shampoo.  4. Use CHG as you would any other liquid soap. You can apply CHG directly to the skin and wash gently with a scrungie or a clean washcloth.   5. Apply the CHG Soap to your body ONLY FROM THE NECK DOWN.  Do not use on open wounds or open sores. Avoid contact with your eyes, ears, mouth and genitals (private parts). Wash Face and genitals (private parts)  with your normal soap.  6. Wash thoroughly, paying special attention to the area where your surgery will be performed.  7. Thoroughly rinse your body with warm water from the neck down.  8. DO NOT shower/wash with your normal soap after using and rinsing off the CHG Soap.  9. Pat yourself dry with a CLEAN TOWEL.  10. Wear CLEAN PAJAMAS to bed  the night before surgery, wear comfortable clothes the morning of surgery  11. Place CLEAN SHEETS on your bed the night of your first shower and DO NOT SLEEP WITH PETS.    Day of Surgery:  Do not apply any deodorants/lotions.  Please wear clean clothes to the hospital/surgery center.   Remember to brush your teeth WITH YOUR REGULAR TOOTHPASTE.    Please read over the following fact sheets that you were given.

## 2018-03-13 NOTE — Progress Notes (Signed)
Office Visit Note   Patient: Jason Friedman.           Date of Birth: May 18, 1940           MRN: [id number] Visit Date: 03/10/2018              Requested by: Marden Noble, MD [address] [place], [place] [address] PCP: Marden Noble, MD   Assessment & Plan: Visit Diagnoses:  1. Other spondylosis with myelopathy, cervical region     Plan: MRI scans reviewed with patient I gave him a copy of his report.  Wife was present for the discussion.  He has diffuse cervical degenerative changes with large central disc protrusion with severe spinal stenosis and cord changes at C4 and C5.  Below the C4-5 level where there is severe stenosis from the large disc protrusion patient has C5-6 severe by foraminal stenosis.  C6-7 level was read as congenital fusion but likely is posttraumatic from his football injury in high school with collapse of the disc space and eventual fusion.  Patient has some foraminal stenosis at C7-T1 with anterolisthesis but no central stenosis at this level.  Patient needs cervical decompression and fusion for his severe spinal stenosis at C4-5 and additionally C5-6 fused for the severe by foraminal stenosis.  Plan would be C4-5, C5-6 anterior cervical discectomy and fusion with allograft and plate.  We discussed the potential for minimal improvement due to the myelopathy and cord changes that are present on his MRI.  He may get some improvement from decompression removing the large disc protrusion compressing the cord.  We discussed in detail that surgery is to prevent further progression of the problem.  Risks of surgery including dysphasia dysphonia use of a collar postoperatively was discussed.  Possibility of pseudoarthrosis with need for posterior cervical fusion if one or both levels did not heal successfully which is not very common.  Questions were elicited and answered he understands and requests we proceed.  Follow-Up Instructions: No follow-ups on file.     Orders:  No orders of the defined types were placed in this encounter.  No orders of the defined types were placed in this encounter.     Procedures: No procedures performed   Clinical Data: No additional findings.   Subjective: Chief Complaint  Patient presents with  . Neck - Follow-up    MRI Cervical Spine Review    HPI 77 year old male returns with problems with neck pain progressive altered gait present over the last 2 to 3 months with difficulty walking bilateral lower extremity numbness in his arms and legs with progressive lower extremity weakness.  Patient had carpal tunnel release 01/02/2018 and since that time is had progressive symptoms with loss of balance and some falls.  He took prednisone Naprosyn and hydrocodone.  He has persistent numbness in his hand both the radial side radial 3 fingers also ulnar 2 fingers.  He has sharp pain when he flexes his neck with electrical sensation that radiates to the sacrum.  He is had to use a cane due to lower extremity weakness and feels that if he does not use it is likely to fall.  No loss of consciousness no fever or chills.  His hand surgeon felt that his problem was likely coming from the cervical spine in addition to the carpal tunnel compression that was surgically corrected in September and is healed nicely.  Past history of likely C5 neck fracture when he is playing football  in high school treated conservatively.  Second injury where bug fell hit his head he states he thinks he had a second spine fracture at that time.  He is had normal thyroid B12 test.  Lumbar spine showed some anterolisthesis at L4-5 with some mild stenosis and severe right foraminal narrowing moderate left.  Some moderate to severe by foraminal narrowing at L5-S1 but no central stenosis.  Patient's exam showed hyperreflexia clonus and bilateral weakness and cervical MRI scan is been obtained and is available for review today.  View of systems updated unchanged  from 03/09/2018.  Positive for neck fracture playing football in high school treated conservatively.  Head injury when a bucket fell the left side of his head also when he was young.  Recent thyroid B12 test normal.  Negative history of TIA CVA.  Negative for chest pain negative for pulmonary problems he is non-smoker.  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  Positive for glaucoma hypertension, sleep apnea, thyroid problems in the past.  Positive for a progressive gait problems for the last 3 months. Objective: Vital Signs: BP (!) 145/82   Pulse 75   Ht 5\' 9"  (1.753 m)   Wt 173 lb (78.5 kg)   BMI 25.55 kg/m   Physical Exam  Constitutional: He is oriented to person, place, and time. He appears well-developed and well-nourished.  HENT:  Head: Normocephalic and atraumatic.  Eyes: Pupils are equal, round, and reactive to light. EOM are normal.  Neck: No tracheal deviation present. No thyromegaly present.  Cardiovascular: Normal rate.  Pulmonary/Chest: Effort normal. He has no wheezes.  Abdominal: Soft. Bowel sounds are normal.  Neurological: He is alert and oriented to person, place, and time.  Skin: Skin is warm and dry. Capillary refill takes less than 2 seconds.  Psychiatric: He has a normal mood and affect. His behavior is normal. Judgment and thought content normal.    Ortho Exam patient has some difficulty getting from sitting to standing ambulates with a short stride gait with left knee slightly flexed poor balance.  He grabs the wall as he tries to turn and rotate.  Positive Lhermitte, severe brachial plexus tenderness worse on the left than right.  3+ triceps reflex 1+ right and left biceps and 1+ brachioradialis.  Negative impingement right left shoulder.  Well-healed carpal tunnel incision on the left.  Left greater than right triceps weakness with left 4 out of 5.  Weakness left wrist flexion more than right.  Bilateral 3+ knee jerk bilateral clonus  at the ankles with 4+ reflexes right and left ankle jerk.  Lateral lower extremity weakness with hip flexion and quad testing.  Specialty Comments:  No specialty comments available.  Imaging: CLINICAL DATA:  Bilateral arm and foot numbness and tingling, chronic. History of cervical spine fracture.  EXAM: MRI CERVICAL SPINE WITHOUT CONTRAST  TECHNIQUE: Multiplanar, multisequence MR imaging of the cervical spine was performed. No intravenous contrast was administered.  COMPARISON:  Cervical spine CT 04/13/2010  FINDINGS: Alignment: Straightening/slight reversal of the normal cervical lordosis. Chronic grade 1 anterolisthesis of C7 on T1. Trace anterolisthesis of C3 on C4 and trace retrolisthesis of C4 on C5.  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 No acute fracture or suspicious osseous lesion. Moderate right-sided degenerative endplate edema at A5-4. Chronic degenerative endplate changes at multiple levels.  Cord: Focal T2 hyperintensity centrally in the cord at C4 with additional left posterolateral cord signal abnormality extending inferiorly to C5, likely myelomalacia.  Posterior Fossa, vertebral arteries,  paraspinal tissues: Unremarkable.  Disc levels:  Disc space narrowing from C3-4 to T1-2, severe at C3-4. Mild diffuse cervical spinal stenosis on a congenital basis.  C2-3: Severe left facet arthrosis with ankylosis. No significant stenosis.  C3-4: Broad-based posterior disc osteophyte complex and moderate right and mild left facet arthrosis result in mild spinal stenosis and severe bilateral neural foraminal stenosis.  C4-5: Large, broad posterior disc protrusion results in severe spinal stenosis with mild cord flattening. Uncovertebral spurring results in severe right greater than left neural foraminal stenosis.  C5-6: Broad-based posterior disc osteophyte complex results in mild spinal stenosis and severe bilateral neural foraminal  stenosis.  C6-7: Congenital fusion.  No stenosis.  C7-T1: Anterolisthesis with mild bulging of uncovered disc and severe right and mild left facet arthrosis result in severe right and moderate left neural foraminal stenosis without significant spinal stenosis.  T1-2: Mild disc bulging, mild endplate spurring, and mild facet arthrosis result in moderate bilateral neural foraminal stenosis without spinal stenosis.  IMPRESSION: 1. Diffuse cervical disc degeneration most notable at C4-5 where a large protrusion results in severe spinal stenosis. 2. Severe multilevel neural foraminal stenosis as above. 3. Mild spinal cord signal abnormality at C4 and C5 suggesting myelomalacia.   Electronically Signed   By: Sebastian Ache M.D.   On: 03/09/2018 15:38   PMFS History: Patient Active Problem List   Diagnosis Date Noted  . Other spondylosis with myelopathy, cervical region 03/09/2018  . 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 OA (osteoarthritis) of hip 08/23/2011   Past Medical History:  Diagnosis Date  . Anemia   . Arthritis   . Cervical spine fracture (HCC) 2  . Cervical vertebral fracture (HCC) 25 yrs ago   C 5   . 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 COPD (chronic obstructive pulmonary disease) (HCC)    mild copd per lov note dr Meredeth Ide 04-16-11 on chart  . Fracture of cervical vertebrae, multiple (HCC)  in high school   2 cervical vertebrae fx, 3 dislocated cervical vertebrae  . Heart abnormality    heart anatomicall rotated, causes abnormal ekg  . Hypertension    hx on, none current  . Hypogonadism in male   . Hypothyroidism   . Impaired memory    x 2 yrs ago after neck injury  . Insomnia   . PONV (postoperative nausea and vomiting)    likes scopolamine patch, vertigo   . Prostate irregularity    nonfuctional prostate, on testerone gel bid  . Sigmoid diverticulitis   . Sleep apnea    does not use due to weight  loss, does not need now, not able to use CPAP    Family History  Problem Relation Age of Onset  . Breast cancer Mother   . Prostate cancer Father   . Diabetes Sister   . Breast cancer Sister   . Diabetes Sister   . Colon cancer Sister     Past Surgical History:  Procedure Laterality Date  . piliodional cyst  yrs ago   done x 3  . right shoulder arthroscopy  Mar 02, 2011  . TONSILLECTOMY    . TOTAL HIP ARTHROPLASTY  08/23/2011   Procedure: TOTAL HIP ARTHROPLASTY;  Surgeon: Loanne Drilling, MD;  Location: [facility];  Service: Orthopedics;  Laterality: Right;  . TOTAL SHOULDER ARTHROPLASTY Left 04/07/2017   Procedure: LEFT TOTAL SHOULDER ARTHROPLASTY;  Surgeon: Francena Hanly, MD;  Location: [facility];  Service:  Orthopedics;  Laterality: Left;   Social History   Occupational History  . Occupation: Retired  Tobacco Use  . Smoking status: Never Smoker  . Smokeless tobacco: Never Used  Substance and Sexual Activity  . Alcohol use: Yes    Alcohol/week: 2.0 - 3.0 standard drinks    Types: 2 - 3 Cans of beer per week  . Drug use: No  . Sexual activity: Not on file

## 2018-03-14 ENCOUNTER — Encounter (HOSPITAL_COMMUNITY)
Admission: RE | Admit: 2018-03-14 | Discharge: 2018-03-14 | Disposition: A | Discharge status: Home / Self Care | Payer: Medicare Other | Source: Ambulatory Visit | Attending: Orthopaedic Surgery | Admitting: Orthopaedic Surgery

## 2018-03-14 ENCOUNTER — Other Ambulatory Visit: Payer: Self-pay

## 2018-03-14 ENCOUNTER — Encounter (HOSPITAL_COMMUNITY): Payer: Self-pay

## 2018-03-14 DIAGNOSIS — J449 Chronic obstructive pulmonary disease, unspecified: Secondary | ICD-10-CM | POA: Insufficient documentation

## 2018-03-14 DIAGNOSIS — I10 Essential (primary) hypertension: Secondary | ICD-10-CM | POA: Insufficient documentation

## 2018-03-14 DIAGNOSIS — G4733 Obstructive sleep apnea (adult) (pediatric): Secondary | ICD-10-CM | POA: Insufficient documentation

## 2018-03-14 DIAGNOSIS — Z01818 Encounter for other preprocedural examination: Secondary | ICD-10-CM | POA: Insufficient documentation

## 2018-03-14 DIAGNOSIS — Z79899 Other long term (current) drug therapy: Secondary | ICD-10-CM | POA: Diagnosis not present

## 2018-03-14 DIAGNOSIS — Z79890 Hormone replacement therapy (postmenopausal): Secondary | ICD-10-CM | POA: Diagnosis not present

## 2018-03-14 DIAGNOSIS — M4802 Spinal stenosis, cervical region: Secondary | ICD-10-CM | POA: Diagnosis not present

## 2018-03-14 DIAGNOSIS — M4712 Other spondylosis with myelopathy, cervical region: Secondary | ICD-10-CM | POA: Insufficient documentation

## 2018-03-14 DIAGNOSIS — E039 Hypothyroidism, unspecified: Secondary | ICD-10-CM | POA: Diagnosis not present

## 2018-03-14 HISTORY — DX: Failed or difficult intubation, initial encounter: T88.4XXA

## 2018-03-14 LAB — BASIC METABOLIC PANEL
ANION GAP: 7 (ref 5–15)
BUN: 24 mg/dL — ABNORMAL HIGH (ref 8–23)
CALCIUM: 9.2 mg/dL (ref 8.9–10.3)
CO2: 27 mmol/L (ref 22–32)
Chloride: 105 mmol/L (ref 98–111)
Creatinine, Ser: 1.04 mg/dL (ref 0.61–1.24)
GFR calc non Af Amer: >60 mL/min (ref >60)
Glucose, Bld: 119 mg/dL — ABNORMAL HIGH (ref 70–99)
Potassium: 4.6 mmol/L (ref 3.5–5.1)
SODIUM: 139 mmol/L (ref 135–145)

## 2018-03-14 LAB — CBC
HCT: 47.7 % (ref 39.0–52.0)
Hemoglobin: 15.4 g/dL (ref 13.0–17.0)
MCH: 33 pg (ref 26.0–34.0)
MCHC: 32.3 g/dL (ref 30.0–36.0)
MCV: 102.1 fL — AB (ref 80.0–100.0)
NRBC: 0 % (ref 0.0–0.2)
PLATELETS: 267 10*3/uL (ref 150–400)
RBC: 4.67 MIL/uL (ref 4.22–5.81)
RDW: 12.9 % (ref 11.5–15.5)
WBC: 9.6 10*3/uL (ref 4.0–10.5)

## 2018-03-14 LAB — SURGICAL PCR SCREEN
MRSA, PCR: NEGATIVE
STAPHYLOCOCCUS AUREUS: NEGATIVE

## 2018-03-14 NOTE — Progress Notes (Addendum)
Call to A. Zelenak, PA-C to alert the remark made by pt. That his throat has been irritated in the past post anesthesia, that the " airway has been difficult to reach in the past" .  In 2018 pt. Referred by cardiology for ECHO due to abnormal EKG. 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 Pt. Has been followed by pulmonary in the past- states the cause for his hoarseness in his voice currently has presented since the changes with  His cervical spine.

## 2018-03-14 NOTE — Progress Notes (Signed)
Call to Dr. Ophelia CharterYates office, spoke with Roanna RaiderSherri & she agrees to enter preop orders this afternoon, aware that surgery is tomorrow.

## 2018-03-14 NOTE — Progress Notes (Signed)
Call to Dr. Ophelia CharterYates office for orders & the office is currently closed.

## 2018-03-14 NOTE — Anesthesia Preprocedure Evaluation (Addendum)
Anesthesia Evaluation  Patient identified by MRN, date of birth, ID band Patient awake    Reviewed: Allergy & Precautions, NPO status , Patient's Chart, lab work & pertinent test results  History of Anesthesia Complications (+) PONV and DIFFICULT AIRWAY  Airway Mallampati: II  TM Distance: >3 FB Neck ROM: Full    Dental no notable dental hx. (+) Teeth Intact, Dental Advisory Given   Pulmonary sleep apnea , COPD,    Pulmonary exam normal breath sounds clear to auscultation       Cardiovascular hypertension, Pt. on medications Normal cardiovascular exam Rhythm:Regular Rate:Normal  Echo 05/19/16 Left ventricle: The cavity size was normal. Wall thickness was   normal. Systolic function was normal. The estimated ejection   fraction was in the range of 55% to 60%. Wall motion was normal;   there were no regional wall motion abnormalities. Doppler   parameters are consistent with abnormal left ventricular   relaxation (grade 1 diastolic dysfunction). - Aortic valve: There was no stenosis. - Aorta: Borderline dilated aortic root. Aortic root dimension: 37   mm (ED). - Mitral valve: There was no significant regurgitation. - Right ventricle: The cavity size was normal. Systolic function   was normal. - Pulmonary arteries: No complete TR doppler jet so unable to   estimate PA systolic pressure. - Inferior vena cava: The vessel was normal in size. The   respirophasic diameter changes were in the normal range (= 50%),   consistent with normal central venous pressure.   Neuro/Psych  Neuromuscular disease negative psych ROS   GI/Hepatic Neg liver ROS,   Endo/Other  Hypothyroidism   Renal/GU negative Renal ROS     Musculoskeletal  (+) Arthritis ,   Abdominal   Peds negative pediatric ROS (+)  Hematology  (+) Blood dyscrasia, anemia ,   Anesthesia Other Findings   Reproductive/Obstetrics                            Lab Results  Component Value Date   CREATININE 1.04 03/14/2018   BUN 24 (H) 03/14/2018   NA 139 03/14/2018   K 4.6 03/14/2018   CL 105 03/14/2018   CO2 27 03/14/2018    Lab Results  Component Value Date   WBC 9.6 03/14/2018   HGB 15.4 03/14/2018   HCT 47.7 03/14/2018   MCV 102.1 (H) 03/14/2018   PLT 267 03/14/2018   Anesthesia Physical Anesthesia Plan  ASA: III  Anesthesia Plan: General   Post-op Pain Management:    Induction: Intravenous  PONV Risk Score and Plan: 2 and Treatment may vary due to age or medical condition, Ondansetron and Dexamethasone  Airway Management Planned: Video Laryngoscope Planned and Oral ETT  Additional Equipment:   Intra-op Plan:   Post-operative Plan: Extubation in OR  Informed Consent: I have reviewed the patients History and Physical, chart, labs and discussed the procedure including the risks, benefits and alternatives for the proposed anesthesia with the patient or authorized representative who has indicated his/her understanding and acceptance.   Dental advisory given  Plan Discussed with: CRNA  Anesthesia Plan Comments: (PAT note written 03/14/2018 by Shonna ChockAllison Zelenak, PA-C. )       Anesthesia Quick Evaluation

## 2018-03-14 NOTE — Progress Notes (Signed)
Anesthesia Chart Review:  Case:  [id number] Date/Time:  03/15/18 1210   Procedure:  C4-5, C5-6 ANTERIOR CERVICAL DECOMPRESSION/DISCECTOMY FUSION ALLOGRAFT AND PLATE (N/A )   Anesthesia type:  General   Pre-op diagnosis:  C4-5, C5-6 SPONDYLOSIS, STENOSIS, MYELOPATHY   [facility] / [facility]   Surgeon:  Marybelle Killings, MD      DISCUSSION: Patient is a 77 year old male scheduled for the above procedure.   History includes never smoker, post-operative N/V, DIFFICULT AIRWAY, HTN, hypothyroidism, cervical fractures/dislocation (neck injury in high school and > 20 years ago), COPD (mild), OSA (intolerant to CPAP, had weight loss). Notes indicate patient reported prior history of abnormal EKG (possibly due elevated right hemidiaphragm with "rotated" heart). He was referred to cardiologist Dr. Minus Breeding in 04/2016 due to abnormal EKG. At that time he reported good exercise tolerance without CV symptoms, so PRN follow-up recommended pending echo results (see below). EKG showed poor r wave progression and low voltage. - According to 02/28/18 note by Dinkins, Erasmo Downer, RN, "Received lab work from Dr. Inda Merlin office, [facility] [place]. Lab: Acetylcholine Receptor AB, all Result: Negative."  Anesthesia records re: DIFFICULT AIRWAY  - 04/07/17: Induction Type: IV induction Ventilation: Mask ventilation without difficulty Laryngoscope Size: Glidescope and 3 Grade View: Grade I Tube type: Oral Tube size: 7.5 mm Number of attempts: 2 Airway Equipment and Method: Stylet and Oral airway Placement Confirmation: ETT inserted through vocal cords under direct vision,  positive ETCO2 and breath sounds checked- equal and bilateral Secured at: 22 cm Tube secured with: Tape Dental Injury: Teeth and Oropharynx as per pre-operative assessment  Difficulty Due To: Difficult Airway- due to anterior larynx Future Recommendations: Recommend- induction with short-acting agent, and alternative techniques readily  available Comments: DL x1 Mil 3 by SRNA, grade 4 view, DL x1 Mil 3 by CRNA, grade 4 view, Glide 3 x1, grade 1 view, ATOI. +BBS/etCO2.   - 08/23/11: Difficult airway?: (c) Yes Grade View: Grade 3 Placed By: Self;CRNA Airway Device: Endotracheal Tube Laryngoscope Blade: MAC;4 ETT Types: Oral Size (mm): 7.5 mm Cuffed: Cuffed    Patient reported this throat was irritated following last surgery (left total shoulder 04/07/17). Noted to be a difficult airway per anesthesia records (as above), but successful intubation with Glidescope. Denied any acute cardiopulmonary issues per PAT RN. PFTs stable at last pulmonary visit earlier this year. EKG appears stable. If no acute changes then I anticipate that he can proceed as planned. According to his medication list, he is currently on prednisone 5 mg TID.    VS: BP (!) 140/51   Pulse 80   Temp 36.5 C   Resp 20   Ht _0  (1.753 m)   Wt 81.3 kg   SpO2 100%   BMI 26.48 kg/m    PROVIDERS: PCP is Dr. Henrine Screws. Pulmonologist is Dr. Wallene Huh [facility]; see Care Everywhere). Last visit 06/22/17 with 8 month follow-up recommended.  Star Age, MD is neurologist  LABS: Labs reviewed: Acceptable for surgery. (all labs ordered are listed, but only abnormal results are displayed)  Labs Reviewed  BASIC METABOLIC PANEL - Abnormal; Notable for the following components:      Result Value   Glucose, Bld 119 (*)    BUN 24 (*)    All other components within normal limits  CBC - Abnormal; Notable for the following components:   MCV 102.1 (*)    All other components within normal limits  SURGICAL PCR SCREEN    Spirometry 06/22/17 ([place]): SPIROMETRY: FVC was 3.25 liters, 90% of predicted FEV1 was 2.31, 82% of predicted FEV1 ratio was 71 FEF 25-75% liters per second was 53% of predicted FLOW VOLUME LOOP: Mildly delayed expiratory flow volume loop Impression Mild obstruction/copd  Compared to Previous  Study, numbers are consistent.    IMAGES: MRI c-spine 03/09/18: IMPRESSION: 1. Diffuse cervical disc degeneration most notable at C4-5 where a large protrusion results in severe spinal stenosis. 2. Severe multilevel neural foraminal stenosis as above. 3. Mild spinal cord signal abnormality at C4 and C5 suggesting Myelomalacia.   EKG:03/14/18: NSR. Borderline LAD. (Automated tracing shows inferior infarct, but I do not see in inferior Q waves.) I think EKG is stable when compared to 05/14/16 tracing.   CV: Echo 05/19/16: Study Conclusions - Left ventricle: The cavity size was normal. Wall thickness was normal. Systolic function was normal. The estimated ejection fraction was in the range of 55% to 60%. Wall motion was normal; there were no regional wall motion abnormalities. Doppler parameters are consistent with abnormal left ventricular relaxation (grade 1 diastolic dysfunction). - Aortic valve: There was no stenosis. - Aorta: Borderline dilated aortic root. Aortic root dimension: 37 mm (ED). - Mitral valve: There was no significant regurgitation. - Right ventricle: The cavity size was normal. Systolic function was normal. - Pulmonary arteries: No complete TR doppler jet so unable to estimate PA systolic pressure. - Inferior vena cava: The vessel was normal in size. The respirophasic diameter changes were in the normal range (= 50%), consistent with normal central venous pressure. Impressions: - Normal LV size with EF 55-60%. Normal RV size and systolic function. No significant valvular abnormalities.   Past Medical History:  Diagnosis Date  . Anemia   . Arthritis   . Cervical spine fracture ([place]) 2  . Cervical vertebral fracture (HCC) 25 yrs ago   C 5   . 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 COPD (chronic obstructive pulmonary disease) ([place])    mild copd per lov note dr Raul Del 04-16-11 on  chart  . Difficult intubation    difficult d/t anterior larynx, successful intubation using Glidescope 04/07/17  . Fracture of cervical vertebrae, multiple (HCC)  in high school   2 cervical vertebrae fx, 3 dislocated cervical vertebrae  . Heart abnormality    heart anatomicall rotated, causes abnormal ekg  . Hypertension    hx on, none current  . Hypogonadism in male   . Hypothyroidism   . Impaired memory    x 2 yrs ago after neck injury  . Insomnia   . PONV (postoperative nausea and vomiting)    likes scopolamine patch, vertigo ,pt. reports that anesthesia "has had problem finding my airway"  . Prostate irregularity    nonfuctional prostate, on testerone gel bid  . Sigmoid diverticulitis   . Sleep apnea    does not use due to weight loss, does not need now, not able to use CPAP, last sleep study- 2018    Past Surgical History:  Procedure Laterality Date  . piliodional cyst  yrs ago   done x 3  . right shoulder arthroscopy  Mar 02, 2011  . TONSILLECTOMY    . TOTAL HIP ARTHROPLASTY  08/23/2011   Procedure: TOTAL HIP ARTHROPLASTY;  Surgeon: Gearlean Alf, MD;  Location: [facility];  Service: Orthopedics;  Laterality: Right;  . TOTAL SHOULDER ARTHROPLASTY Left 04/07/2017  Procedure: LEFT TOTAL SHOULDER ARTHROPLASTY;  Surgeon: Justice Britain, MD;  Location: [facility];  Service: Orthopedics;  Laterality: Left;    MEDICATIONS: . vitamin B-12 (CYANOCOBALAMIN) 500 MCG tablet  . ANDROGEL PUMP 20.25 MG/ACT (1.62%) GEL  . Brinzolamide-Brimonidine (SIMBRINZA) 1-0.2 % SUSP  . Cholecalciferol (VITAMIN D-3) 1000 units CAPS  . cromolyn (OPTICROM) 4 % ophthalmic solution  . HYDROcodone-acetaminophen (NORCO/VICODIN) 5-325 MG tablet  . levothyroxine (SYNTHROID, LEVOTHROID) 137 MCG tablet  . losartan (COZAAR) 25 MG tablet  . meclizine (ANTIVERT) 25 MG tablet  . naproxen (NAPROSYN) 500 MG tablet  . predniSONE (DELTASONE) 5 MG tablet  . tiZANidine (ZANAFLEX) 4 MG tablet  . vitamin E 400 UNIT  capsule   No current facility-administered medications for this encounter.     George Hugh [facility] Short Stay Center/Anesthesiology Phone [phone] 03/14/2018 1:29 PM

## 2018-03-14 NOTE — Pre-Procedure Instructions (Signed)
Jason Friedman.  03/14/2018      [id number]- [place], [place] - [address] [place] [place] [id number] Phone: [phone] Fax: [phone]    Your procedure is scheduled on November 20th.  [facility] Admitting at 10:25 A.M.   Call this number if you have problems the morning of surgery:  [phone]   Remember:  Do not eat or drink after midnight.    Take these medicines the morning of surgery with A SIP OF WATER   Brinzolamide-Brimonidine (SIMBRINZA)   cromolyn (OPTICROM) 4 % ophthalmic solution  HYDROcodone-acetaminophen (NORCO/VICODIN) if needed  levothyroxine (SYNTHROID, LEVOTHROID)  meclizine (ANTIVERT) if needed  predniSONE (DELTASONE)   7 days prior to surgery STOP taking any Aspirin(unless otherwise instructed by your surgeon), Aleve, Naproxen, Ibuprofen, Motrin, Advil, Goody's, BC's, all herbal medications, fish oil, and all vitamins     Do not wear jewelry.  Do not wear lotions, powders, or colognes, or deodorant.  Men may shave face and neck.  Do not bring valuables to the hospital.  [facility] is not responsible for any belongings or valuables.  Contacts, dentures or bridgework may not be worn into surgery.  Leave your suitcase in the car.  After surgery it may be brought to your room.  For patients admitted to the hospital, discharge time will be determined by your treatment team.  Patients discharged the day of surgery will not be allowed to drive home.    [facility]- Preparing For Surgery  Before surgery, you can play an important role. Because skin is not sterile, your skin needs to be as free of germs as possible. You can reduce the number of germs on your skin by washing with CHG (chlorahexidine gluconate) Soap before surgery.  CHG is an antiseptic cleaner which kills germs and bonds with the skin to continue killing germs even after washing.    Oral Hygiene is also important to reduce your  risk of infection.  Remember - BRUSH YOUR TEETH THE MORNING OF SURGERY WITH YOUR REGULAR TOOTHPASTE  Please do not use if you have an allergy to CHG or antibacterial soaps. If your skin becomes reddened/irritated stop using the CHG.  Do not shave (including legs and underarms) for at least 48 hours prior to first CHG shower. It is OK to shave your face.  Please follow these instructions carefully.   1. Shower the NIGHT BEFORE SURGERY and the MORNING OF SURGERY with CHG.   2. If you chose to wash your hair, wash your hair first as usual with your normal shampoo.  3. After you shampoo, rinse your hair and body thoroughly to remove the shampoo.  4. Use CHG as you would any other liquid soap. You can apply CHG directly to the skin and wash gently with a scrungie or a clean washcloth.   5. Apply the CHG Soap to your body ONLY FROM THE NECK DOWN.  Do not use on open wounds or open sores. Avoid contact with your eyes, ears, mouth and genitals (private parts). Wash Face and genitals (private parts)  with your normal soap.  6. Wash thoroughly, paying special attention to the area where your surgery will be performed.  7. Thoroughly rinse your body with warm water from the neck down.  8. DO NOT shower/wash with your normal soap after using and rinsing off the CHG Soap.  9. Pat yourself dry with a CLEAN TOWEL.  10. Wear CLEAN PAJAMAS to  bed the night before surgery, wear comfortable clothes the morning of surgery  11. Place CLEAN SHEETS on your bed the night of your first shower and DO NOT SLEEP WITH PETS.    Day of Surgery:  Do not apply any deodorants/lotions.  Please wear clean clothes to the hospital/surgery center.   Remember to brush your teeth WITH YOUR REGULAR TOOTHPASTE.    Please read over the following fact sheets that you were given.

## 2018-03-15 ENCOUNTER — Other Ambulatory Visit: Payer: Self-pay

## 2018-03-15 ENCOUNTER — Ambulatory Visit (HOSPITAL_COMMUNITY): Payer: Medicare Other | Admitting: Certified Registered Nurse Anesthetist

## 2018-03-15 ENCOUNTER — Ambulatory Visit (HOSPITAL_COMMUNITY): Payer: Medicare Other | Admitting: Vascular Surgery

## 2018-03-15 ENCOUNTER — Encounter (HOSPITAL_COMMUNITY)
Admission: RE | Disposition: A | Discharge status: Home / Self Care | Payer: Self-pay | Source: Ambulatory Visit | Attending: Orthopaedic Surgery

## 2018-03-15 ENCOUNTER — Encounter (HOSPITAL_COMMUNITY): Payer: Self-pay

## 2018-03-15 ENCOUNTER — Ambulatory Visit (HOSPITAL_COMMUNITY): Payer: Medicare Other

## 2018-03-15 ENCOUNTER — Observation Stay (HOSPITAL_COMMUNITY)
Admission: RE | Admit: 2018-03-15 | Discharge: 2018-03-16 | Disposition: A | Discharge status: Home / Self Care | Payer: Medicare Other | Source: Ambulatory Visit | Attending: Orthopaedic Surgery | Admitting: Orthopaedic Surgery

## 2018-03-15 DIAGNOSIS — I10 Essential (primary) hypertension: Secondary | ICD-10-CM | POA: Diagnosis not present

## 2018-03-15 DIAGNOSIS — Z79899 Other long term (current) drug therapy: Secondary | ICD-10-CM | POA: Insufficient documentation

## 2018-03-15 DIAGNOSIS — Z01818 Encounter for other preprocedural examination: Secondary | ICD-10-CM

## 2018-03-15 DIAGNOSIS — Z79890 Hormone replacement therapy (postmenopausal): Secondary | ICD-10-CM | POA: Insufficient documentation

## 2018-03-15 DIAGNOSIS — M4712 Other spondylosis with myelopathy, cervical region: Secondary | ICD-10-CM

## 2018-03-15 DIAGNOSIS — J449 Chronic obstructive pulmonary disease, unspecified: Secondary | ICD-10-CM | POA: Insufficient documentation

## 2018-03-15 DIAGNOSIS — M50021 Cervical disc disorder at C4-C5 level with myelopathy: Secondary | ICD-10-CM | POA: Insufficient documentation

## 2018-03-15 DIAGNOSIS — M4802 Spinal stenosis, cervical region: Secondary | ICD-10-CM | POA: Diagnosis not present

## 2018-03-15 DIAGNOSIS — Z791 Long term (current) use of non-steroidal anti-inflammatories (NSAID): Secondary | ICD-10-CM | POA: Diagnosis not present

## 2018-03-15 DIAGNOSIS — G4730 Sleep apnea, unspecified: Secondary | ICD-10-CM | POA: Insufficient documentation

## 2018-03-15 DIAGNOSIS — Z96641 Presence of right artificial hip joint: Secondary | ICD-10-CM | POA: Insufficient documentation

## 2018-03-15 DIAGNOSIS — Q240 Dextrocardia: Secondary | ICD-10-CM | POA: Diagnosis not present

## 2018-03-15 DIAGNOSIS — M1990 Unspecified osteoarthritis, unspecified site: Secondary | ICD-10-CM | POA: Diagnosis not present

## 2018-03-15 DIAGNOSIS — H409 Unspecified glaucoma: Secondary | ICD-10-CM | POA: Insufficient documentation

## 2018-03-15 DIAGNOSIS — Z96612 Presence of left artificial shoulder joint: Secondary | ICD-10-CM | POA: Diagnosis not present

## 2018-03-15 DIAGNOSIS — E039 Hypothyroidism, unspecified: Secondary | ICD-10-CM | POA: Insufficient documentation

## 2018-03-15 DIAGNOSIS — Z419 Encounter for procedure for purposes other than remedying health state, unspecified: Secondary | ICD-10-CM

## 2018-03-15 HISTORY — PX: ANTERIOR CERVICAL DECOMP/DISCECTOMY FUSION: SHX1161

## 2018-03-15 HISTORY — DX: Spinal stenosis, cervical region: M48.02

## 2018-03-15 LAB — URINALYSIS, ROUTINE W REFLEX MICROSCOPIC
Bilirubin Urine: NEGATIVE
GLUCOSE, UA: NEGATIVE mg/dL
HGB URINE DIPSTICK: NEGATIVE
KETONES UR: NEGATIVE mg/dL
Leukocytes, UA: NEGATIVE
Nitrite: NEGATIVE
PROTEIN: NEGATIVE mg/dL
Specific Gravity, Urine: 1.014 (ref 1.005–1.030)
pH: 7 (ref 5.0–8.0)

## 2018-03-15 LAB — COMPREHENSIVE METABOLIC PANEL
ALBUMIN: 3.8 g/dL (ref 3.5–5.0)
ALK PHOS: 45 U/L (ref 38–126)
ALT: 15 U/L (ref 0–44)
AST: 17 U/L (ref 15–41)
Anion gap: 5 (ref 5–15)
BILIRUBIN TOTAL: 0.8 mg/dL (ref 0.3–1.2)
BUN: 19 mg/dL (ref 8–23)
CALCIUM: 9 mg/dL (ref 8.9–10.3)
CO2: 27 mmol/L (ref 22–32)
Chloride: 107 mmol/L (ref 98–111)
Creatinine, Ser: 0.95 mg/dL (ref 0.61–1.24)
GFR calc Af Amer: >60 mL/min (ref >60)
GLUCOSE: 108 mg/dL — AB (ref 70–99)
POTASSIUM: 4.1 mmol/L (ref 3.5–5.1)
Sodium: 139 mmol/L (ref 135–145)
TOTAL PROTEIN: 6.1 g/dL — AB (ref 6.5–8.1)

## 2018-03-15 SURGERY — ANTERIOR CERVICAL DECOMPRESSION/DISCECTOMY FUSION 2 LEVELS
Anesthesia: General | Site: Spine Cervical

## 2018-03-15 MED ORDER — PHENOL 1.4 % MT LIQD: 1.0000 | OROMUCOSAL | Status: DC | PRN

## 2018-03-15 MED ORDER — ROCURONIUM BROMIDE 50 MG/5ML IV SOSY
PREFILLED_SYRINGE | INTRAVENOUS | Status: AC
  Filled 2018-03-15: qty 10

## 2018-03-15 MED ORDER — CHLORHEXIDINE GLUCONATE 4 % EX LIQD: 60.0000 mL | Freq: Once | CUTANEOUS | Status: DC

## 2018-03-15 MED ORDER — LIDOCAINE 2% (20 MG/ML) 5 ML SYRINGE
INTRAMUSCULAR | Status: AC
  Filled 2018-03-15: qty 5

## 2018-03-15 MED ORDER — VITAMIN D 25 MCG (1000 UNIT) PO TABS
1000.0000 [IU] | ORAL_TABLET | Freq: Every day | ORAL | Status: DC
  Filled 2018-03-15: qty 1

## 2018-03-15 MED ORDER — METHOCARBAMOL 500 MG PO TABS
500.0000 mg | ORAL_TABLET | Freq: Four times a day (QID) | ORAL | Status: DC | PRN
  Administered 2018-03-15 (×2): 500 mg via ORAL
  Filled 2018-03-15: qty 1

## 2018-03-15 MED ORDER — DEXAMETHASONE SODIUM PHOSPHATE 10 MG/ML IJ SOLN
INTRAMUSCULAR | Status: AC
  Filled 2018-03-15: qty 1

## 2018-03-15 MED ORDER — SODIUM CHLORIDE 0.9% FLUSH: 3.0000 mL | Freq: Two times a day (BID) | INTRAVENOUS | Status: DC

## 2018-03-15 MED ORDER — POLYETHYLENE GLYCOL 3350 17 G PO PACK: 17.0000 g | PACK | Freq: Every day | ORAL | Status: DC

## 2018-03-15 MED ORDER — BRINZOLAMIDE 1 % OP SUSP
1.0000 [drp] | Freq: Three times a day (TID) | OPHTHALMIC | Status: DC
  Filled 2018-03-15: qty 10

## 2018-03-15 MED ORDER — SODIUM CHLORIDE 0.9 % IV SOLN
INTRAVENOUS | Status: DC | PRN
  Administered 2018-03-15: 25 ug/min via INTRAVENOUS

## 2018-03-15 MED ORDER — SODIUM CHLORIDE 0.9 % IV SOLN: INTRAVENOUS | Status: DC

## 2018-03-15 MED ORDER — SUGAMMADEX SODIUM 200 MG/2ML IV SOLN
INTRAVENOUS | Status: AC
  Filled 2018-03-15: qty 2

## 2018-03-15 MED ORDER — BUPIVACAINE-EPINEPHRINE 0.25% -1:200000 IJ SOLN
INTRAMUSCULAR | Status: DC | PRN
  Administered 2018-03-15: 16 mL

## 2018-03-15 MED ORDER — SODIUM CHLORIDE 0.9% FLUSH: 3.0000 mL | INTRAVENOUS | Status: DC | PRN

## 2018-03-15 MED ORDER — OXYCODONE-ACETAMINOPHEN 5-325 MG PO TABS: 1.0000 | ORAL_TABLET | Freq: Four times a day (QID) | ORAL | 0 refills | Status: DC | PRN

## 2018-03-15 MED ORDER — SODIUM CHLORIDE 0.9 % IV SOLN: 250.0000 mL | INTRAVENOUS | Status: DC

## 2018-03-15 MED ORDER — BRIMONIDINE TARTRATE 0.2 % OP SOLN
1.0000 [drp] | Freq: Three times a day (TID) | OPHTHALMIC | Status: DC
  Filled 2018-03-15: qty 5

## 2018-03-15 MED ORDER — SUCCINYLCHOLINE CHLORIDE 200 MG/10ML IV SOSY
PREFILLED_SYRINGE | INTRAVENOUS | Status: AC
  Filled 2018-03-15: qty 10

## 2018-03-15 MED ORDER — MECLIZINE HCL 25 MG PO TABS
25.0000 mg | ORAL_TABLET | Freq: Two times a day (BID) | ORAL | Status: DC | PRN
  Filled 2018-03-15: qty 1

## 2018-03-15 MED ORDER — LEVOTHYROXINE SODIUM 137 MCG PO TABS
137.0000 ug | ORAL_TABLET | Freq: Every day | ORAL | Status: DC
  Administered 2018-03-16: 137 ug via ORAL
  Filled 2018-03-15: qty 1

## 2018-03-15 MED ORDER — ONDANSETRON HCL 4 MG/2ML IJ SOLN: 4.0000 mg | Freq: Four times a day (QID) | INTRAMUSCULAR | Status: DC | PRN

## 2018-03-15 MED ORDER — OXYCODONE HCL 5 MG PO TABS
ORAL_TABLET | ORAL | Status: AC
  Filled 2018-03-15: qty 1

## 2018-03-15 MED ORDER — ACETAMINOPHEN 325 MG PO TABS: 650.0000 mg | ORAL_TABLET | ORAL | Status: DC | PRN

## 2018-03-15 MED ORDER — VANCOMYCIN HCL IN DEXTROSE 1-5 GM/200ML-% IV SOLN
1000.0000 mg | Freq: Two times a day (BID) | INTRAVENOUS | Status: DC
  Administered 2018-03-15: 1000 mg via INTRAVENOUS
  Filled 2018-03-15: qty 200

## 2018-03-15 MED ORDER — CROMOLYN SODIUM 4 % OP SOLN
1.0000 [drp] | Freq: Three times a day (TID) | OPHTHALMIC | Status: DC
  Filled 2018-03-15: qty 10

## 2018-03-15 MED ORDER — ACETAMINOPHEN 650 MG RE SUPP: 650.0000 mg | RECTAL | Status: DC | PRN

## 2018-03-15 MED ORDER — ONDANSETRON HCL 4 MG/2ML IJ SOLN
INTRAMUSCULAR | Status: AC
  Filled 2018-03-15: qty 2

## 2018-03-15 MED ORDER — OXYCODONE HCL 5 MG PO TABS
5.0000 mg | ORAL_TABLET | Freq: Four times a day (QID) | ORAL | Status: DC | PRN
  Administered 2018-03-15 – 2018-03-16 (×3): 5 mg via ORAL
  Filled 2018-03-15 (×2): qty 1

## 2018-03-15 MED ORDER — LOSARTAN POTASSIUM 50 MG PO TABS
25.0000 mg | ORAL_TABLET | Freq: Every day | ORAL | Status: DC
  Administered 2018-03-15: 25 mg via ORAL
  Filled 2018-03-15: qty 1

## 2018-03-15 MED ORDER — ONDANSETRON HCL 4 MG/2ML IJ SOLN
INTRAMUSCULAR | Status: DC | PRN
  Administered 2018-03-15: 4 mg via INTRAVENOUS

## 2018-03-15 MED ORDER — FENTANYL CITRATE (PF) 250 MCG/5ML IJ SOLN
INTRAMUSCULAR | Status: AC
  Filled 2018-03-15: qty 5

## 2018-03-15 MED ORDER — DOCUSATE SODIUM 100 MG PO CAPS
100.0000 mg | ORAL_CAPSULE | Freq: Two times a day (BID) | ORAL | Status: DC
  Administered 2018-03-15: 100 mg via ORAL
  Filled 2018-03-15: qty 1

## 2018-03-15 MED ORDER — HEMOSTATIC AGENTS (NO CHARGE) OPTIME
TOPICAL | Status: DC | PRN
  Administered 2018-03-15: 1

## 2018-03-15 MED ORDER — LIDOCAINE 2% (20 MG/ML) 5 ML SYRINGE
INTRAMUSCULAR | Status: DC | PRN
  Administered 2018-03-15: 100 mg via INTRAVENOUS

## 2018-03-15 MED ORDER — ROCURONIUM BROMIDE 10 MG/ML (PF) SYRINGE
PREFILLED_SYRINGE | INTRAVENOUS | Status: DC | PRN
  Administered 2018-03-15: 20 mg via INTRAVENOUS
  Administered 2018-03-15: 50 mg via INTRAVENOUS
  Administered 2018-03-15: 20 mg via INTRAVENOUS
  Administered 2018-03-15: 10 mg via INTRAVENOUS

## 2018-03-15 MED ORDER — ONDANSETRON HCL 4 MG PO TABS: 4.0000 mg | ORAL_TABLET | Freq: Four times a day (QID) | ORAL | Status: DC | PRN

## 2018-03-15 MED ORDER — PROPOFOL 10 MG/ML IV BOLUS
INTRAVENOUS | Status: DC | PRN
  Administered 2018-03-15: 180 mg via INTRAVENOUS

## 2018-03-15 MED ORDER — BUPIVACAINE-EPINEPHRINE (PF) 0.25% -1:200000 IJ SOLN
INTRAMUSCULAR | Status: AC
  Filled 2018-03-15: qty 30

## 2018-03-15 MED ORDER — METHOCARBAMOL 1000 MG/10ML IJ SOLN
500.0000 mg | Freq: Four times a day (QID) | INTRAVENOUS | Status: DC | PRN
  Filled 2018-03-15: qty 5

## 2018-03-15 MED ORDER — EPHEDRINE 5 MG/ML INJ
INTRAVENOUS | Status: AC
  Filled 2018-03-15: qty 10

## 2018-03-15 MED ORDER — EPHEDRINE SULFATE-NACL 50-0.9 MG/10ML-% IV SOSY
PREFILLED_SYRINGE | INTRAVENOUS | Status: DC | PRN
  Administered 2018-03-15: 5 mg via INTRAVENOUS
  Administered 2018-03-15: 10 mg via INTRAVENOUS
  Administered 2018-03-15: 5 mg via INTRAVENOUS

## 2018-03-15 MED ORDER — DEXAMETHASONE SODIUM PHOSPHATE 10 MG/ML IJ SOLN
INTRAMUSCULAR | Status: DC | PRN
  Administered 2018-03-15: 10 mg via INTRAVENOUS

## 2018-03-15 MED ORDER — PROPOFOL 10 MG/ML IV BOLUS
INTRAVENOUS | Status: AC
  Filled 2018-03-15: qty 20

## 2018-03-15 MED ORDER — NON FORMULARY: Freq: Three times a day (TID) | Status: DC

## 2018-03-15 MED ORDER — CYANOCOBALAMIN 500 MCG PO TABS
500.0000 ug | ORAL_TABLET | Freq: Every day | ORAL | Status: DC
  Administered 2018-03-15: 500 ug via ORAL
  Filled 2018-03-15 (×2): qty 1

## 2018-03-15 MED ORDER — MENTHOL 3 MG MT LOZG
1.0000 | LOZENGE | OROMUCOSAL | Status: DC | PRN
  Filled 2018-03-15: qty 9

## 2018-03-15 MED ORDER — 0.9 % SODIUM CHLORIDE (POUR BTL) OPTIME
TOPICAL | Status: DC | PRN
  Administered 2018-03-15: 1000 mL

## 2018-03-15 MED ORDER — METHOCARBAMOL 500 MG PO TABS
ORAL_TABLET | ORAL | Status: AC
  Filled 2018-03-15: qty 1

## 2018-03-15 MED ORDER — VANCOMYCIN HCL IN DEXTROSE 1-5 GM/200ML-% IV SOLN
1000.0000 mg | INTRAVENOUS | Status: AC
  Administered 2018-03-15: 1000 mg via INTRAVENOUS
  Filled 2018-03-15: qty 200

## 2018-03-15 MED ORDER — SUCCINYLCHOLINE CHLORIDE 200 MG/10ML IV SOSY
PREFILLED_SYRINGE | INTRAVENOUS | Status: DC | PRN
  Administered 2018-03-15: 100 mg via INTRAVENOUS

## 2018-03-15 MED ORDER — FENTANYL CITRATE (PF) 250 MCG/5ML IJ SOLN
INTRAMUSCULAR | Status: DC | PRN
  Administered 2018-03-15: 100 ug via INTRAVENOUS
  Administered 2018-03-15 (×3): 50 ug via INTRAVENOUS

## 2018-03-15 MED ORDER — LACTATED RINGERS IV SOLN
INTRAVENOUS | Status: DC
  Administered 2018-03-15 (×2): via INTRAVENOUS

## 2018-03-15 SURGICAL SUPPLY — 58 items
BENZOIN TINCTURE PRP APPL 2/3 (GAUZE/BANDAGES/DRESSINGS) ×3 IMPLANT
BIT DRILL SRG 14X2.2XFLT CHK (BIT) ×1 IMPLANT
BIT DRL SRG 14X2.2XFLT CHK (BIT) ×1
BLADE CLIPPER SURG (BLADE) IMPLANT
BONE CERV LORDOTIC 14.5X12X6 (Bone Implant) ×3 IMPLANT
BONE CERV LORDOTIC 14.5X12X8 (Bone Implant) ×3 IMPLANT
BUR ROUND FLUTED 4 SOFT TCH (BURR) IMPLANT
BUR ROUND FLUTED 4MM SOFT TCH (BURR)
CLOSURE WOUND 1/2 X4 (GAUZE/BANDAGES/DRESSINGS) ×1
COLLAR CERV LO CONTOUR FIRM DE (SOFTGOODS) IMPLANT
CORD BIPOLAR FORCEPS 12FT (ELECTRODE) ×3 IMPLANT
COVER SURGICAL LIGHT HANDLE (MISCELLANEOUS) IMPLANT
COVER WAND RF STERILE (DRAPES) ×3 IMPLANT
CRADLE DONUT ADULT HEAD (MISCELLANEOUS) ×3 IMPLANT
DRAPE C-ARM 42X72 X-RAY (DRAPES) ×3 IMPLANT
DRAPE HALF SHEET 40X57 (DRAPES) ×3 IMPLANT
DRAPE MICROSCOPE LEICA (MISCELLANEOUS) ×3 IMPLANT
DRILL BIT SKYLINE 14MM (BIT) ×2
DURAPREP 6ML APPLICATOR 50/CS (WOUND CARE) ×3 IMPLANT
ELECT COATED BLADE 2.86 ST (ELECTRODE) ×3 IMPLANT
ELECT REM PT RETURN 9FT ADLT (ELECTROSURGICAL) ×3
ELECTRODE REM PT RTRN 9FT ADLT (ELECTROSURGICAL) ×1 IMPLANT
EVACUATOR 1/8 PVC DRAIN (DRAIN) ×3 IMPLANT
GAUZE SPONGE 4X4 12PLY STRL (GAUZE/BANDAGES/DRESSINGS) ×3 IMPLANT
GLOVE BIOGEL PI IND STRL 8 (GLOVE) ×6 IMPLANT
GLOVE BIOGEL PI INDICATOR 8 (GLOVE) ×12
GLOVE INDICATOR 7.5 STRL GRN (GLOVE) ×12 IMPLANT
GOWN STRL REUS W/ TWL LRG LVL3 (GOWN DISPOSABLE) ×1 IMPLANT
GOWN STRL REUS W/ TWL XL LVL3 (GOWN DISPOSABLE) ×1 IMPLANT
GOWN STRL REUS W/TWL 2XL LVL3 (GOWN DISPOSABLE) ×3 IMPLANT
GOWN STRL REUS W/TWL LRG LVL3 (GOWN DISPOSABLE) ×2
GOWN STRL REUS W/TWL XL LVL3 (GOWN DISPOSABLE) ×2
HEAD HALTER (SOFTGOODS) ×3 IMPLANT
HEMOSTAT SURGICEL 2X14 (HEMOSTASIS) IMPLANT
KIT BASIN OR (CUSTOM PROCEDURE TRAY) ×3 IMPLANT
KIT TURNOVER KIT B (KITS) ×3 IMPLANT
MANIFOLD NEPTUNE II (INSTRUMENTS) IMPLANT
NEEDLE 25GX 5/8IN NON SAFETY (NEEDLE) ×3 IMPLANT
NS IRRIG 1000ML POUR BTL (IV SOLUTION) ×3 IMPLANT
PACK ORTHO CERVICAL (CUSTOM PROCEDURE TRAY) ×3 IMPLANT
PAD ARMBOARD 7.5X6 YLW CONV (MISCELLANEOUS) ×6 IMPLANT
PATTIES SURGICAL .5 X.5 (GAUZE/BANDAGES/DRESSINGS) IMPLANT
PIN TEMP SKYLINE THREADED (PIN) ×3 IMPLANT
PLATE SKYLINE TWO LEVEL 28MM (Plate) ×3 IMPLANT
RESTRAINT LIMB HOLDER UNIV (RESTRAINTS) IMPLANT
SCREW VAR SELF TAP SKYLINE 14M (Screw) ×18 IMPLANT
SPONGE LAP 4X18 RFD (DISPOSABLE) ×3 IMPLANT
STRIP CLOSURE SKIN 1/2X4 (GAUZE/BANDAGES/DRESSINGS) ×2 IMPLANT
SURGIFLO W/THROMBIN 8M KIT (HEMOSTASIS) ×3 IMPLANT
SUT BONE WAX W31G (SUTURE) ×3 IMPLANT
SUT VIC AB 3-0 X1 27 (SUTURE) ×3 IMPLANT
SUT VICRYL 4-0 PS2 18IN ABS (SUTURE) ×6 IMPLANT
SYR BULB IRRIGATION 50ML (SYRINGE) ×3 IMPLANT
SYR CONTROL 10ML LL (SYRINGE) ×3 IMPLANT
TAPE CLOTH SURG 4X10 WHT LF (GAUZE/BANDAGES/DRESSINGS) ×3 IMPLANT
TOWEL OR 17X24 6PK STRL BLUE (TOWEL DISPOSABLE) ×3 IMPLANT
TOWEL OR 17X26 10 PK STRL BLUE (TOWEL DISPOSABLE) ×3 IMPLANT
TRAY FOLEY CATH SILVER 16FR (SET/KITS/TRAYS/PACK) IMPLANT

## 2018-03-15 NOTE — Op Note (Addendum)
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  Postop diagnosis: Same  Procedure: C4-5, C5-6 anterior cervical discectomy and fusion allograft and plate.  Surgeon: Annell GreeningMark Tyronza Happe, MD  Assistant: Jason KiefJames Owens, PA-C medically necessary and present for the entire procedure  Anesthesia: General glide scope intubation +6 cc Marcaine skin local  EBL 100 cc  Drains: Hemovac neck  Brief history: 77 year old male with 7561-month history of progressive gait disturbance hyperreflexia upper and lower extremity weakness with falling and large broad-based disc protrusion C4-5 with severe spinal stenosis cord flattening and cord changes at C4 and C5.  Patient had broad-based disc bulge with severe bilateral neuroforaminal stenosis bilaterally at C5-6.  Procedure: After induction general anesthesia orotracheal intubation preoperative antibiotics timeout procedure neck was prepped after application of head halter traction without weight.  Patient was intubated with a glide scope avoiding neck flexion or extension.  Arms were tucked to the side wrist restraints were placed for visualization use minimally since patient had a rather long neck and C4-5 and C5-6 were visualized with minimal pulldown.  There is squared with towel sterile skin marker used on the left side of the neck starting at the midline and Betadine Steri-Drape sterile male standard the head and thyroid sheets and drapes.  Operative microscope was draped and was used for takedown of the posterior aspect of the discs and microdissection decompression of the dura removal of spurs and removal of disc fragments off the dura.  Incision was made starting the midline extending the left platysma divided in line with the skin incision.  Blunt dissection down the longus Coley with extremely large 2 cm osteophyte noted at C5-6.  Short 25 needle placed at C4-5 which was a very tight space with overhanging spurs.  Crosstable draped C arm confirmed  appropriate level.  Patient had large disc with severe stenosis at this level with myelopathic changes on the cord and C4-5 level was surgically treated first.  Using curettes 4 mm fluted bur we progressed down the posterior longitudinal ligament.  Posteriorly there was 1/2 mm space between spurs that were touching and these were taken down with 1 mm Kerrison and 10 mm Kerrison.  Large pieces of disc were removed and complete removal of the posterior longitudinal ligament for decompression of the dura taking 1 to 2 mm of bone above and below to catch some of the fragment that extruded behind the vertebral body.  Uncovertebral joints were stripped and wants there was good decompression cord was seen bulging into the field some Surgi-Flo was used and there was minimal epidural bleeding.  Trial sizer showed 8 mm graft gave good fit without over distraction.  7 was slightly loose.  Traction was pulled by the CRNA with mild traction and the graft was countersunk 1 mm.  Large spurs at C5-6 removed with the Bayer rondure and bur and discectomy was performed at C5-6.  At this level only a 6 mm graft was placed to avoid distracting the cord since there were myelopathic changes but the 6 mm graft did give some satisfactory fit and was not loose.  Skyline 28 mm plate was selected 14 mm screws were placed after C-arm visualization showing good position of the plate and screws.  Screws were replaced 14 mm x 6 checked with final AP lateral spot C-arm pictures and then screws were locked down Hemovac was placed within out technique one small artery was coagulated on the medial side.  Teeth retractors were placed underneath the longus Coley were removed  smooth blades were used cephalad and caudad.  Operative field was dry repeat irrigation Hemovac placed then closure of platysma 3-0 Vicryl 4-0 Vicryl subcuticular closure tincture benzoin Steri-Strips 6 cc Marcaine infiltration.  4 x 4 tape and soft cervical collar was applied  patient was transfer the care of room in stable condition.  He was ambulatory on the floor with improvement in sensation in his upper extremities and continued myelopathic gait but improved ambulation.

## 2018-03-15 NOTE — Interval H&P Note (Signed)
History and Physical Interval Note:  03/15/2018 12:32 PM  Jason HomansBrooks J Sherard Jr.  has presented today for surgery, with the diagnosis of C4-5, C5-6 SPONDYLOSIS, STENOSIS, MYELOPATHY  The various methods of treatment have been discussed with the patient and family. After consideration of risks, benefits and other options for treatment, the patient has consented to  Procedure(s) with comments: CERVICAL FOUR-FIVE, CERVICAL FIVE-SIX ANTERIOR CERVICAL DECOMPRESSION/DISCECTOMY FUSION ALLOGRAFT AND PLATE (N/A) - CERVICAL FOUR-FIVE, CERVICAL FIVE-SIX ANTERIOR CERVICAL DECOMPRESSION/DISCECTOMY FUSION ALLOGRAFT AND PLATE as a surgical intervention .  The patient's history has been reviewed, patient examined, no change in status, stable for surgery.  I have reviewed the patient's chart and labs.  Questions were answered to the patient's satisfaction.     Eldred MangesMark C Deaun Rocha

## 2018-03-15 NOTE — Anesthesia Procedure Notes (Signed)
Procedure Name: Intubation Date/Time: 03/15/2018 12:49 PM Performed by: Waynard EdwardsSmith, Denae Zulueta A, CRNA Pre-anesthesia Checklist: Patient identified, Emergency Drugs available, Suction available and Patient being monitored Patient Re-evaluated:Patient Re-evaluated prior to induction Oxygen Delivery Method: Circle system utilized Preoxygenation: Pre-oxygenation with 100% oxygen Induction Type: IV induction Ventilation: Mask ventilation without difficulty Laryngoscope Size: Glidescope and 4 Grade View: Grade I Tube type: Oral Tube size: 8.0 mm Number of attempts: 1 Airway Equipment and Method: Rigid stylet and Video-laryngoscopy Placement Confirmation: ETT inserted through vocal cords under direct vision,  positive ETCO2 and breath sounds checked- equal and bilateral Secured at: 23 cm Tube secured with: Tape Dental Injury: Teeth and Oropharynx as per pre-operative assessment  Difficulty Due To: Difficulty was anticipated and Difficult Airway-  due to neck instability Comments: Elective glidescope per surgeon due to cervical myelopathy.

## 2018-03-15 NOTE — Progress Notes (Signed)
Pharmacy Antibiotic Note  Jason HomansBrooks J Sokolov Jr. is a 77 y.o. male admitted on 03/15/2018 with surgical prophylaxis.    Plan: Vanc 1 g q12 Monitor LOT  Height: 5\' 9"  (175.3 cm) Weight: 179 lb 4.8 oz (81.3 kg) IBW/kg (Calculated) : 70.7  Temp (24hrs), Avg:97.6 F (36.4 C), Min:97.4 F (36.3 C), Max:97.8 F (36.6 C)  Recent Labs  Lab 03/14/18 1001 03/15/18 1111  WBC 9.6  --   CREATININE 1.04 0.95    Estimated Creatinine Clearance: 67.2 mL/min (by C-G formula based on SCr of 0.95 mg/dL).    Allergies  Allergen Reactions  . Duloxetine Other (See Comments)    Altered personality - didn't eat, pain all over  . Cefuroxime Axetil [Ceftin] Other (See Comments)    Joint pain  . Gabapentin Other (See Comments)    Visual disturbance   . Latex Other (See Comments)    Skin irritation   . Shellfish Allergy Swelling  . Sulfa Antibiotics Other (See Comments)    Unknown   Isaac BlissMichael Keegan Bensch, PharmD, BCPS, BCCCP Clinical Pharmacist [phone]  Please check AMION for all [facility] numbers  03/15/2018 4:59 PM

## 2018-03-15 NOTE — Anesthesia Postprocedure Evaluation (Signed)
Anesthesia Post Note  Patient: Jason Friedman.  Procedure(s) Performed: CERVICAL FOUR-FIVE, CERVICAL FIVE-SIX ANTERIOR CERVICAL DECOMPRESSION/DISCECTOMY FUSION ALLOGRAFT AND PLATE (N/A Spine Cervical)     Patient location during evaluation: PACU Anesthesia Type: General Level of consciousness: awake and alert Pain management: pain level controlled Vital Signs Assessment: post-procedure vital signs reviewed and stable Respiratory status: spontaneous breathing, nonlabored ventilation, respiratory function stable and patient connected to nasal cannula oxygen Cardiovascular status: blood pressure returned to baseline and stable Postop Assessment: no apparent nausea or vomiting Anesthetic complications: no    Last Vitals:  Vitals:   03/15/18 1615 03/15/18 1637  BP: (!) 154/82 (!) 159/88  Pulse: 85 96  Resp: 17 18  Temp:  (!) 36.3 C  SpO2: 98% 97%    Last Pain:  Vitals:   03/15/18 1657  TempSrc:   PainSc: 3                  Trevor IhaStephen A Rhylan Gross

## 2018-03-15 NOTE — H&P (Signed)
Office Visit Note              Patient: Jason Friedman.                                    Date of Birth: 1940/07/23                                                    MRN: [id number] Visit Date: 03/10/2018                                                                     Requested by: Marden Noble, MD [address] [place], [place] [address] PCP: Marden Noble, MD   Assessment & Plan: Visit Diagnoses:  1. Other spondylosis with myelopathy, cervical region     Plan: MRI scans reviewed with patient I gave him a copy of his report.  Wife was present for the discussion.  He has diffuse cervical degenerative changes with large central disc protrusion with severe spinal stenosis and cord changes at C4 and C5.  Below the C4-5 level where there is severe stenosis from the large disc protrusion patient has C5-6 severe by foraminal stenosis.  C6-7 level was read as congenital fusion but likely is posttraumatic from his football injury in high school with collapse of the disc space and eventual fusion.  Patient has some foraminal stenosis at C7-T1 with anterolisthesis but no central stenosis at this level.  Patient needs cervical decompression and fusion for his severe spinal stenosis at C4-5 and additionally C5-6 fused for the severe by foraminal stenosis.  Plan would be C4-5, C5-6 anterior cervical discectomy and fusion with allograft and plate.  We discussed the potential for minimal improvement due to the myelopathy and cord changes that are present on his MRI.  He may get some improvement from decompression removing the large disc protrusion compressing the cord.  We discussed in detail that surgery is to prevent further progression of the problem.  Risks of surgery including dysphasia dysphonia use of a collar postoperatively was discussed.  Possibility of pseudoarthrosis with need for posterior cervical fusion if one or both levels did not heal successfully which is not very  common.  Questions were elicited and answered he understands and requests we proceed.  Follow-Up Instructions: No follow-ups on file.   Orders:  No orders of the defined types were placed in this encounter.  No orders of the defined types were placed in this encounter.     Procedures: No procedures performed   Clinical Data: No additional findings.   Subjective:     Chief Complaint  Patient presents with  . Neck - Follow-up    MRI Cervical Spine Review    HPI 77 year old male returns with problems with neck pain progressive altered gait present over the last 2 to 3 months with difficulty walking bilateral lower extremity numbness in his arms and legs with progressive lower extremity weakness.  Patient had carpal tunnel release 01/02/2018 and since that time is  had progressive symptoms with loss of balance and some falls.  He took prednisone Naprosyn and hydrocodone.  He has persistent numbness in his hand both the radial side radial 3 fingers also ulnar 2 fingers.  He has sharp pain when he flexes his neck with electrical sensation that radiates to the sacrum.  He is had to use a cane due to lower extremity weakness and feels that if he does not use it is likely to fall.  No loss of consciousness no fever or chills.  His hand surgeon felt that his problem was likely coming from the cervical spine in addition to the carpal tunnel compression that was surgically corrected in September and is healed nicely.  Past history of likely C5 neck fracture when he is playing football in high school treated conservatively.  Second injury where bug fell hit his head he states he thinks he had a second spine fracture at that time.  He is had normal thyroid B12 test.  Lumbar spine showed some anterolisthesis at L4-5 with some mild stenosis and severe right foraminal narrowing moderate left.  Some moderate to severe by foraminal narrowing at L5-S1 but no central stenosis.  Patient's exam showed  hyperreflexia clonus and bilateral weakness and cervical MRI scan is been obtained and is available for review today.  View of systems updated unchanged from 03/09/2018.  Positive for neck fracture playing football in high school treated conservatively.  Head injury when a bucket fell the left side of his head also when he was young.  Recent thyroid B12 test normal.  Negative history of TIA CVA.  Negative for chest pain negative for pulmonary problems he is non-smoker.  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  Positive for glaucoma hypertension, sleep apnea, thyroid problems in the past.  Positive for a progressive gait problems for the last 3 months. Objective: Vital Signs: BP (!) 145/82   Pulse 75   Ht 5\' 9"  (1.753 m)   Wt 173 lb (78.5 kg)   BMI 25.55 kg/m   Physical Exam  Constitutional: He is oriented to person, place, and time. He appears well-developed and well-nourished.  HENT:  Head: Normocephalic and atraumatic.  Eyes: Pupils are equal, round, and reactive to light. EOM are normal.  Neck: No tracheal deviation present. No thyromegaly present.  Cardiovascular: Normal rate.  Pulmonary/Chest: Effort normal. He has no wheezes.  Abdominal: Soft. Bowel sounds are normal.  Neurological: He is alert and oriented to person, place, and time.  Skin: Skin is warm and dry. Capillary refill takes less than 2 seconds.  Psychiatric: He has a normal mood and affect. His behavior is normal. Judgment and thought content normal.    Ortho Exam patient has some difficulty getting from sitting to standing ambulates with a short stride gait with left knee slightly flexed poor balance.  He grabs the wall as he tries to turn and rotate.  Positive Lhermitte, severe brachial plexus tenderness worse on the left than right.  3+ triceps reflex 1+ right and left biceps and 1+ brachioradialis.  Negative impingement right left shoulder.  Well-healed carpal tunnel incision on  the left.  Left greater than right triceps weakness with left 4 out of 5.  Weakness left wrist flexion more than right.  Bilateral 3+ knee jerk bilateral clonus at the ankles with 4+ reflexes right and left ankle jerk.  Lateral lower extremity weakness with hip flexion and quad testing.  Specialty Comments:  No specialty comments available.  Imaging: CLINICAL DATA: Bilateral arm and foot numbness and tingling, chronic. History of cervical spine fracture.  EXAM: MRI CERVICAL SPINE WITHOUT CONTRAST  TECHNIQUE: Multiplanar, multisequence MR imaging of the cervical spine was performed. No intravenous contrast was administered.  COMPARISON: Cervical spine CT 04/13/2010  FINDINGS: Alignment: Straightening/slight reversal of the normal cervical lordosis. Chronic grade 1 anterolisthesis of C7 on T1. Trace anterolisthesis of C3 on C4 and trace retrolisthesis of C4 on C5.  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 No acute fracture or suspicious osseous lesion. Moderate right-sided degenerative endplate edema at B1-4. Chronic degenerative endplate changes at multiple levels.  Cord: Focal T2 hyperintensity centrally in the cord at C4 with additional left posterolateral cord signal abnormality extending inferiorly to C5, likely myelomalacia.  Posterior Fossa, vertebral arteries, paraspinal tissues: Unremarkable.  Disc levels:  Disc space narrowing from C3-4 to T1-2, severe at C3-4. Mild diffuse cervical spinal stenosis on a congenital basis.  C2-3: Severe left facet arthrosis with ankylosis. No significant stenosis.  C3-4: Broad-based posterior disc osteophyte complex and moderate right and mild left facet arthrosis result in mild spinal stenosis and severe bilateral neural foraminal stenosis.  C4-5: Large, broad posterior disc protrusion results in severe spinal stenosis with mild cord flattening. Uncovertebral spurring results in severe right greater than left neural  foraminal stenosis.  C5-6: Broad-based posterior disc osteophyte complex results in mild spinal stenosis and severe bilateral neural foraminal stenosis.  C6-7: Congenital fusion. No stenosis.  C7-T1: Anterolisthesis with mild bulging of uncovered disc and severe right and mild left facet arthrosis result in severe right and moderate left neural foraminal stenosis without significant spinal stenosis.  T1-2: Mild disc bulging, mild endplate spurring, and mild facet arthrosis result in moderate bilateral neural foraminal stenosis without spinal stenosis.  IMPRESSION: 1. Diffuse cervical disc degeneration most notable at C4-5 where a large protrusion results in severe spinal stenosis. 2. Severe multilevel neural foraminal stenosis as above. 3. Mild spinal cord signal abnormality at C4 and C5 suggesting myelomalacia.   Electronically Signed By: Sebastian Ache M.D. On: 03/09/2018 15:38   PMFS History:     Patient Active Problem List   Diagnosis Date Noted  . Other spondylosis with myelopathy, cervical region 03/09/2018  . 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 OA (osteoarthritis) of hip 08/23/2011   Past Medical History:  Diagnosis Date  . Anemia   . Arthritis   . Cervical spine fracture (HCC) 2  . Cervical vertebral fracture (HCC) 25 yrs ago   C 5   . 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 COPD (chronic obstructive pulmonary disease) (HCC)    mild copd per lov note dr Meredeth Ide 04-16-11 on chart  . Fracture of cervical vertebrae, multiple (HCC)  in high school   2 cervical vertebrae fx, 3 dislocated cervical vertebrae  . Heart abnormality    heart anatomicall rotated, causes abnormal ekg  . Hypertension    hx on, none current  . Hypogonadism in male   . Hypothyroidism   . Impaired memory    x 2 yrs ago after neck injury  . Insomnia   . PONV (postoperative nausea and vomiting)    likes  scopolamine patch, vertigo   . Prostate irregularity    nonfuctional prostate, on testerone gel bid  . Sigmoid diverticulitis   . Sleep apnea    does not use due to weight loss, does not need now, not able to use CPAP  Family History  Problem Relation Age of Onset  . Breast cancer Mother   . Prostate cancer Father   . Diabetes Sister   . Breast cancer Sister   . Diabetes Sister   . Colon cancer Sister          Past Surgical History:  Procedure Laterality Date  . piliodional cyst  yrs ago   done x 3  . right shoulder arthroscopy  Mar 02, 2011  . TONSILLECTOMY    . TOTAL HIP ARTHROPLASTY  08/23/2011   Procedure: TOTAL HIP ARTHROPLASTY;  Surgeon: Loanne DrillingFrank V Aluisio, MD;  Location: [facility];  Service: Orthopedics;  Laterality: Right;  . TOTAL SHOULDER ARTHROPLASTY Left 04/07/2017   Procedure: LEFT TOTAL SHOULDER ARTHROPLASTY;  Surgeon: Francena HanlySupple, Kevin, MD;  Location: [facility];  Service: Orthopedics;  Laterality: Left;   Social History        Occupational History  . Occupation: Retired  Tobacco Use  . Smoking status: Never Smoker  . Smokeless tobacco: Never Used  Substance and Sexual Activity  . Alcohol use: Yes    Alcohol/week: 2.0 - 3.0 standard drinks    Types: 2 - 3 Cans of beer per week  . Drug use: No  . Sexual activity: Not on file              Electronically signed by Eldred MangesYates, Alandra Sando C, MD at 03/13/2018 5:40 PM

## 2018-03-15 NOTE — Transfer of Care (Signed)
Immediate Anesthesia Transfer of Care Note  Patient: Jason HomansBrooks J Wisnewski Jr.  Procedure(s) Performed: CERVICAL FOUR-FIVE, CERVICAL FIVE-SIX ANTERIOR CERVICAL DECOMPRESSION/DISCECTOMY FUSION ALLOGRAFT AND PLATE (N/A Spine Cervical)  Patient Location: PACU  Anesthesia Type:General  Level of Consciousness: awake, alert , oriented and patient cooperative  Airway & Oxygen Therapy: Patient Spontanous Breathing and Patient connected to nasal cannula oxygen  Post-op Assessment: Report given to RN, Post -op Vital signs reviewed and stable and Patient moving all extremities X 4  Post vital signs: Reviewed and stable  Last Vitals:  Vitals Value Taken Time  BP 147/78 03/15/2018  3:30 PM  Temp    Pulse 93 03/15/2018  3:33 PM  Resp 21 03/15/2018  3:33 PM  SpO2 97 % 03/15/2018  3:33 PM  Vitals shown include unvalidated device data.  Last Pain:  Vitals:   03/15/18 1122  TempSrc:   PainSc: 0-No pain         Complications: No apparent anesthesia complications

## 2018-03-16 ENCOUNTER — Encounter (HOSPITAL_COMMUNITY): Payer: Self-pay | Admitting: Orthopaedic Surgery

## 2018-03-16 DIAGNOSIS — M4802 Spinal stenosis, cervical region: Secondary | ICD-10-CM | POA: Diagnosis not present

## 2018-03-16 NOTE — Progress Notes (Signed)
   Subjective: 1 Day Post-Op Procedure(s) (LRB): CERVICAL FOUR-FIVE, CERVICAL FIVE-SIX ANTERIOR CERVICAL DECOMPRESSION/DISCECTOMY FUSION ALLOGRAFT AND PLATE (N/A) Patient reports pain as mild. " my legs are better, I can already walk better, my arms and hands are not tingling "    Objective: Vital signs in last 24 hours: Temp:  [97.4 F (36.3 C)-98 F (36.7 C)] 97.9 F (36.6 C) (11/21 0406) Pulse Rate:  [66-96] 83 (11/21 0406) Resp:  [11-18] 18 (11/21 0406) BP: (115-159)/(69-88) 124/71 (11/21 0406) SpO2:  [96 %-98 %] 97 % (11/21 0406) Weight:  [81.3 kg] 81.3 kg (11/20 1122)  Intake/Output from previous day: 11/20 0701 - 11/21 0700 In: 1525.2 [P.O.:240; I.V.:1100; IV Piggyback:185.2] Out: 260 [Drains:60; Blood:200] Intake/Output this shift: No intake/output data recorded.  Recent Labs    03/14/18 1001  HGB 15.4   Recent Labs    03/14/18 1001  WBC 9.6  RBC 4.67  HCT 47.7  PLT 267   Recent Labs    03/14/18 1001 03/15/18 1111  NA 139 139  K 4.6 4.1  CL 105 107  CO2 27 27  BUN 24* 19  CREATININE 1.04 0.95  GLUCOSE 119* 108*  CALCIUM 9.2 9.0   No results for input(s): LABPT, INR in the last 72 hours.  still with myelopathic gait but improved. ambulating without walking aids.  Dg Chest 2 View  Result Date: 03/15/2018 CLINICAL DATA:  Preop cervical fusion EXAM: CHEST - 2 VIEW COMPARISON:  08/12/2011 FINDINGS: Stable chronic elevation of the right hemidiaphragm. Heart is normal size. No confluent opacities or effusions. No acute bony abnormality. IMPRESSION: Stable chronic elevation of the right hemidiaphragm. No active disease. Electronically Signed   By: Charlett NoseKevin  Dover M.D.   On: 03/15/2018 12:03   Dg Cervical Spine 2-3 Views  Result Date: 03/15/2018 CLINICAL DATA:  Cervical fusion EXAM: DG C-ARM 61-120 MIN; CERVICAL SPINE - 2-3 VIEW COMPARISON:  MR 03/09/2018 FINDINGS: 2 intraoperative fluoroscopic spot images document changes of instrumented ACDF C4-C6. Hardware  and interbody graft project in expected location. No spondylolisthesis. Persistent narrowing of the C3-4 interspace. The cervicothoracic junction is not included on the lateral projection. IMPRESSION: Interval ACDF C4-C6 Electronically Signed   By: Corlis Leak  Hassell M.D.   On: 03/15/2018 15:20   Dg C-arm 1-60 Min  Result Date: 03/15/2018 CLINICAL DATA:  Cervical fusion EXAM: DG C-ARM 61-120 MIN; CERVICAL SPINE - 2-3 VIEW COMPARISON:  MR 03/09/2018 FINDINGS: 2 intraoperative fluoroscopic spot images document changes of instrumented ACDF C4-C6. Hardware and interbody graft project in expected location. No spondylolisthesis. Persistent narrowing of the C3-4 interspace. The cervicothoracic junction is not included on the lateral projection. IMPRESSION: Interval ACDF C4-C6 Electronically Signed   By: Corlis Leak  Hassell M.D.   On: 03/15/2018 15:20    Assessment/Plan: 1 Day Post-Op Procedure(s) (LRB): CERVICAL FOUR-FIVE, CERVICAL FIVE-SIX ANTERIOR CERVICAL DECOMPRESSION/DISCECTOMY FUSION ALLOGRAFT AND PLATE (N/A) Plan: discharge home . He has a walker he can use and also a cane. Drain removed , dressing changed. Office one week  Eldred MangesMark C Kirby Cortese 03/16/2018, 7:53 AM

## 2018-03-16 NOTE — Discharge Instructions (Signed)
Walk daily. Use soft food and liquids for a week or so to make it easier to swallow .  Use cane for walking to avoid falling until you leg strength and balance improves. See Dr. Ophelia CharterYates in one week. Use extra collar with saran wrap over it to shower. Avoid lifting and straining.

## 2018-03-16 NOTE — Progress Notes (Signed)
Orthopedic Tech Progress Note Patient Details:  Jason HomansBrooks J Dusenbury Jr. [birth year] [id number]  Ortho Devices Type of Ortho Device: Soft collar Ortho Device/Splint Interventions: Application   Post Interventions Patient Tolerated: Well Instructions Provided: Care of device   Jason Friedman 03/16/2018, 9:29 AM

## 2018-03-16 NOTE — Progress Notes (Signed)
Patient is discharged form room 3C08 at this time. Alert and in stable condition. IV site d/c'd and instructions read to patient and spouse with understanding verbalized. Left unit via wheelchair with all belongings at side. 

## 2018-03-17 ENCOUNTER — Ambulatory Visit (INDEPENDENT_AMBULATORY_CARE_PROVIDER_SITE_OTHER): Payer: Medicare Other | Admitting: Orthopaedic Surgery

## 2018-03-21 ENCOUNTER — Encounter

## 2018-03-21 ENCOUNTER — Encounter: Payer: Medicare Other | Admitting: Neurology

## 2018-03-21 NOTE — Discharge Summary (Signed)
Patient ID: Jason Friedman. MRN: [id number] DOB/AGE: 75-Feb-1942 75 y.o.  Admit date: 03/15/2018 Discharge date: 03/21/2018  Admission Diagnoses:  Active Problems:   Other spondylosis with myelopathy, cervical region   Cervical stenosis of spine   Discharge Diagnoses:  Active Problems:   Other spondylosis with myelopathy, cervical region   Cervical stenosis of spine  status post Procedure(s): CERVICAL FOUR-FIVE, CERVICAL FIVE-SIX ANTERIOR CERVICAL DECOMPRESSION/DISCECTOMY FUSION ALLOGRAFT AND PLATE  Past Medical History:  Diagnosis Date  . Anemia   . Arthritis   . Cervical spinal stenosis   . Cervical spine fracture (HCC) 2  . Cervical vertebral fracture (HCC) 25 yrs ago   C 5   . 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 COPD (chronic obstructive pulmonary disease) (HCC)    mild copd per lov note dr Meredeth Ide 04-16-11 on chart  . Difficult intubation    difficult d/t anterior larynx, successful intubation using Glidescope 04/07/17  . Fracture of cervical vertebrae, multiple (HCC)  in high school   2 cervical vertebrae fx, 3 dislocated cervical vertebrae  . Heart abnormality    heart anatomicall rotated, causes abnormal ekg  . Hypertension    hx on, none current  . Hypogonadism in male   . Hypothyroidism   . Impaired memory    x 2 yrs ago after neck injury  . Insomnia   . PONV (postoperative nausea and vomiting)    likes scopolamine patch, vertigo ,pt. reports that anesthesia "has had problem finding my airway"  . Prostate irregularity    nonfuctional prostate, on testerone gel bid  . Sigmoid diverticulitis   . Sleep apnea    does not use due to weight loss, does not need now, not able to use CPAP, last sleep study- 2018    Surgeries: Procedure(s): CERVICAL FOUR-FIVE, CERVICAL FIVE-SIX ANTERIOR CERVICAL DECOMPRESSION/DISCECTOMY FUSION ALLOGRAFT AND PLATE on 40/98/1191   Consultants:   Discharged Condition:  Improved  Hospital Course: Kaj Vasil. is an 77 y.o. male who was admitted 03/15/2018 for operative treatment of cervical stenosis. Patient failed conservative treatments (please see the history and physical for the specifics) and had severe unremitting pain that affects sleep, daily activities and work/hobbies. After pre-op clearance, the patient was taken to the operating room on 03/15/2018 and underwent  Procedure(s): CERVICAL FOUR-FIVE, CERVICAL FIVE-SIX ANTERIOR CERVICAL DECOMPRESSION/DISCECTOMY FUSION ALLOGRAFT AND PLATE.    Patient was given perioperative antibiotics:  Anti-infectives (From admission, onward)   Start     Dose/Rate Route Frequency Ordered Stop   03/15/18 2200  vancomycin (VANCOCIN) IVPB 1000 mg/200 mL premix  Status:  Discontinued     1,000 mg 200 mL/hr over 60 Minutes Intravenous Every 12 hours 03/15/18 1658 03/16/18 1232   03/15/18 1100  vancomycin (VANCOCIN) IVPB 1000 mg/200 mL premix     1,000 mg 200 mL/hr over 60 Minutes Intravenous On call to O.R. 03/15/18 1054 03/15/18 1656       Patient was given sequential compression devices and early ambulation to prevent DVT.   Patient benefited maximally from hospital stay and there were no complications. At the time of discharge, the patient was urinating/moving their bowels without difficulty, tolerating a regular diet, pain is controlled with oral pain medications and they have been cleared by PT/OT.   Recent vital signs: No data found.   Recent laboratory studies: No results for input(s): WBC, HGB, HCT, PLT, NA, K, CL, CO2, BUN, CREATININE, GLUCOSE, INR, CALCIUM in  the last 72 hours.  Invalid input(s): PT, 2   Discharge Medications:   Allergies as of 03/16/2018      Reactions   Duloxetine Other (See Comments)   Altered personality - didn't eat, pain all over   Cefuroxime Axetil [ceftin] Other (See Comments)   Joint pain   Gabapentin Other (See Comments)   Visual disturbance    Latex Other (See  Comments)   Skin irritation    Shellfish Allergy Swelling   Sulfa Antibiotics Other (See Comments)   Unknown      Medication List    STOP taking these medications   HYDROcodone-acetaminophen 5-325 MG tablet Commonly known as:  NORCO/VICODIN   naproxen 500 MG tablet Commonly known as:  NAPROSYN     TAKE these medications   ANDROGEL PUMP 20.25 MG/ACT (1.62%) Gel Generic drug:  Testosterone Apply 3 Pump topically 2 (two) times daily.   cromolyn 4 % ophthalmic solution Commonly known as:  OPTICROM Place 1 drop into the right eye 3 (three) times daily.   levothyroxine 137 MCG tablet Commonly known as:  SYNTHROID, LEVOTHROID Take 137 mcg by mouth daily before breakfast.   losartan 25 MG tablet Commonly known as:  COZAAR Take 25 mg by mouth at bedtime.   meclizine 25 MG tablet Commonly known as:  ANTIVERT Take 25 mg by mouth 2 (two) times daily as needed for dizziness.   oxyCODONE-acetaminophen 5-325 MG tablet Commonly known as:  PERCOCET/ROXICET Take 1 tablet by mouth every 6 (six) hours as needed for severe pain.   predniSONE 5 MG tablet Commonly known as:  DELTASONE Take 5 mg by mouth 3 (three) times daily.   SIMBRINZA 1-0.2 % Susp Generic drug:  Brinzolamide-Brimonidine Place 1 drop into the left eye 3 (three) times daily.   tiZANidine 4 MG tablet Commonly known as:  ZANAFLEX Take 4 mg by mouth at bedtime.   vitamin B-12 500 MCG tablet Commonly known as:  CYANOCOBALAMIN Take 500 mcg by mouth daily.   Vitamin D-3 25 MCG (1000 UT) Caps Take 1,000 Units by mouth daily.   vitamin E 400 UNIT capsule Take 400 Units by mouth daily.       Diagnostic Studies: Dg Chest 2 View  Result Date: 03/15/2018 CLINICAL DATA:  Preop cervical fusion EXAM: CHEST - 2 VIEW COMPARISON:  08/12/2011 FINDINGS: Stable chronic elevation of the right hemidiaphragm. Heart is normal size. No confluent opacities or effusions. No acute bony abnormality. IMPRESSION: Stable chronic  elevation of the right hemidiaphragm. No active disease. Electronically Signed   By: Charlett Nose M.D.   On: 03/15/2018 12:03   Dg Cervical Spine 2-3 Views  Result Date: 03/15/2018 CLINICAL DATA:  Cervical fusion EXAM: DG C-ARM 61-120 MIN; CERVICAL SPINE - 2-3 VIEW COMPARISON:  MR 03/09/2018 FINDINGS: 2 intraoperative fluoroscopic spot images document changes of instrumented ACDF C4-C6. Hardware and interbody graft project in expected location. No spondylolisthesis. Persistent narrowing of the C3-4 interspace. The cervicothoracic junction is not included on the lateral projection. IMPRESSION: Interval ACDF C4-C6 Electronically Signed   By: Corlis Leak M.D.   On: 03/15/2018 15:20   Mr Cervical Spine W/o Contrast  Result Date: 03/09/2018 CLINICAL DATA:  Bilateral arm and foot numbness and tingling, chronic. History of cervical spine fracture. EXAM: MRI CERVICAL SPINE WITHOUT CONTRAST TECHNIQUE: Multiplanar, multisequence MR imaging of the cervical spine was performed. No intravenous contrast was administered. COMPARISON:  Cervical spine CT 04/13/2010 FINDINGS: Alignment: Straightening/slight reversal of the normal cervical lordosis. Chronic grade 1 anterolisthesis  of C7 on T1. Trace anterolisthesis of C3 on C4 and trace retrolisthesis of C4 on C5. 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 No acute fracture or suspicious osseous lesion. Moderate right-sided degenerative endplate edema at Z6-1C4-5. Chronic degenerative endplate changes at multiple levels. Cord: Focal T2 hyperintensity centrally in the cord at C4 with additional left posterolateral cord signal abnormality extending inferiorly to C5, likely myelomalacia. Posterior Fossa, vertebral arteries, paraspinal tissues: Unremarkable. Disc levels: Disc space narrowing from C3-4 to T1-2, severe at C3-4. Mild diffuse cervical spinal stenosis on a congenital basis. C2-3: Severe left facet arthrosis with ankylosis. No significant stenosis. C3-4: Broad-based posterior disc  osteophyte complex and moderate right and mild left facet arthrosis result in mild spinal stenosis and severe bilateral neural foraminal stenosis. C4-5: Large, broad posterior disc protrusion results in severe spinal stenosis with mild cord flattening. Uncovertebral spurring results in severe right greater than left neural foraminal stenosis. C5-6: Broad-based posterior disc osteophyte complex results in mild spinal stenosis and severe bilateral neural foraminal stenosis. C6-7: Congenital fusion.  No stenosis. C7-T1: Anterolisthesis with mild bulging of uncovered disc and severe right and mild left facet arthrosis result in severe right and moderate left neural foraminal stenosis without significant spinal stenosis. T1-2: Mild disc bulging, mild endplate spurring, and mild facet arthrosis result in moderate bilateral neural foraminal stenosis without spinal stenosis. IMPRESSION: 1. Diffuse cervical disc degeneration most notable at C4-5 where a large protrusion results in severe spinal stenosis. 2. Severe multilevel neural foraminal stenosis as above. 3. Mild spinal cord signal abnormality at C4 and C5 suggesting myelomalacia. Electronically Signed   By: Sebastian AcheAllen  Grady M.D.   On: 03/09/2018 15:38   Mr Lumbar Spine Wo Contrast  Result Date: 02/26/2018  [facility] [address], [address] [place], [place] [address] [phone] NEUROIMAGING REPORT STUDY DATE: 02/25/2018 PATIENT NAME: Jason HomansBrooks J Parfait Jr. DOB: June 21, 1940 MRN: [id number] EXAM: MRI of the lumbar spine without contrast ORDERING CLINICIAN: Huston FoleySaima Athar, MD, PhD CLINICAL HISTORY: 77 year old man with weakness, motor neuron disease COMPARISON FILMS: MRI 05/22/2015 TECHNIQUE: MRI of the lumbar spine was obtained utilizing 4 mm sagittal slices from T11-12 down to the lower sacrum with T1, T2 and inversion recovery views. In addition 4 mm axial slices from L1-2 down to L5-S1 level were included with T1 and T2 weighted views. CONTRAST: None IMAGING  SITE: [facility], [address], [place], [place] FINDINGS: On sagittal images, the spine is imaged from T11 to the sacrum.   The conus medullaris and cauda equine appear normal.     There is 3 mm of anterolisthesis of L4 upon L5.  There is moderately severe to severe loss of disc height at T11-T12, L1-L2, L2-L3 and L5-S1.  Mild edema is noted in the endplates adjacent to T11-T12 and L5-S1.  A Schmorl's node is noted in the inferior endplate of L1.  The discs and interspaces were further evaluated on axial views from L1 to S1 as follows: T12-L1: There is mild right facet hypertrophy and right ligamenta flava hypertrophy.  There is no spinal stenosis or significant foraminal narrowing or lateral recess stenosis.  There is no nerve root compression. L1-L2:   There is broad disc protrusion, mild facet hypertrophy.  There is mild bilateral foraminal narrowing and mild to moderate bilateral lateral recess stenosis but no nerve root compression. L2-L3:   There is disc protrusion to the left, endplate spurring and facet hypertrophy and left ligamenta flava hypertrophy.  These combine to cause moderate left foraminal narrowing and severe  left lateral recess stenosis that could compress the left L3 nerve root L3-L4: There is disc protrusion more to the left, facet hypertrophy and endplate spurring causing moderate left foraminal narrowing and mild to moderate left lateral recess stenosis.  There did not appear to be nerve root compression or spinal stenosis. L4-L5: There is mild spinal stenosis due to a combination of anterolisthesis of L4 upon L5, severe facet hypertrophy, bulging of the uncovered disc and ligamenta flava hypertrophy.  There is moderately severe right foraminal narrowing and left lateral recess stenosis with moderate right lateral recess stenosis.  There is potential for right L4 and left L5 nerve root compression.  The degenerative changes at this level have progressed when compared to the  05/22/2015 MRI. L5-S1: There is almost complete loss of disc height associated with endplate spurring and facet hypertrophy.  There is moderately severe bilateral foraminal narrowing, mild to moderate left and mild right lateral recess stenosis.  There is potential for compression of either of the L5 nerve roots. When compared to the MRI dated 05/22/2015, the degenerative changes at L4-L5 have mildly progressed while the other levels are essentially unchanged.   This MRI of the lumbar spine without contrast shows advanced multilevel degenerative changes as detailed above.  Most significant findings are: 1.    At L2-L3, there are degenerative changes more to the left causing severe left lateral recess stenosis that could compress the left L3 nerve root. 2.    At L3-L4, there are degenerative changes more to the left causing moderate left foraminal narrowing and mild to moderate left lateral recess stenosis but no definite nerve root compression. 3.    At L4-L5, there is 3 mm of anterolisthesis, severe facet hypertrophy, ligamenta flava hypertrophy and disc bulging causing mild spinal stenosis and moderately severe right foraminal narrowing and moderately severe left lateral recess stenosis.  There is potential for right L4 and left L5 nerve root compression. 4.    At L5-S1, there are degenerative changes causing moderately severe bilateral foraminal narrowing with potential to compress either of the L5 nerve roots. 5.    Compared to the MRI dated 05/22/2015, degenerative changes at L4-L5 have progressed well other levels are essentially unchanged. INTERPRETING PHYSICIAN: Richard A. Epimenio Foot, MD, PhD, FAAN Certified in  Neuroimaging by [employer]   Dg C-arm 1-60 Min  Result Date: 03/15/2018 CLINICAL DATA:  Cervical fusion EXAM: DG C-ARM 61-120 MIN; CERVICAL SPINE - 2-3 VIEW COMPARISON:  MR 03/09/2018 FINDINGS: 2 intraoperative fluoroscopic spot images document changes of instrumented ACDF C4-C6.  Hardware and interbody graft project in expected location. No spondylolisthesis. Persistent narrowing of the C3-4 interspace. The cervicothoracic junction is not included on the lateral projection. IMPRESSION: Interval ACDF C4-C6 Electronically Signed   By: Corlis Leak M.D.   On: 03/15/2018 15:20   Xr Cervical Spine 2 Or 3 Views  Result Date: 03/09/2018 AP lateral cervical spine and lateral flexion-extension x-rays obtained.  This shows mild cervical kyphosis.  Multilevel disc space narrowing with greater than 75% loss of disc height at C3-4, C4-5, C5-6.  He has ankylosis at C6-7.  Anterior and posterior spurring at C4-5 and C5-6 with likely old C5 superior endplate fracture with maintenance of posterior vertebral body height.  Some posterior calcification at C5-6.  Progression of ankylosis at C6-7 compared to previous 2011 cervical CT scan. Impression: 2 level cervical spondylosis with multilevel disc space narrowing spurring and reversal of normal curvature.  Ankylosis at C6-7 posttraumatic deformity at C5  and possibly C4.     Follow-up Information    Eldred Manges, MD.   Specialty:  Orthopedic Surgery Why:  need return office visit one week postop Contact information: [address] [place] [address] [phone]           Discharge Plan:  discharge to home  Disposition:     Signed: Zonia Kief 03/21/2018, 4:12 PM

## 2018-03-22 ENCOUNTER — Ambulatory Visit (INDEPENDENT_AMBULATORY_CARE_PROVIDER_SITE_OTHER): Payer: Medicare Other | Admitting: Surgery

## 2018-03-22 ENCOUNTER — Encounter (INDEPENDENT_AMBULATORY_CARE_PROVIDER_SITE_OTHER): Payer: Self-pay | Admitting: Surgery

## 2018-03-22 ENCOUNTER — Ambulatory Visit (INDEPENDENT_AMBULATORY_CARE_PROVIDER_SITE_OTHER): Payer: Self-pay

## 2018-03-22 DIAGNOSIS — M542 Cervicalgia: Secondary | ICD-10-CM

## 2018-03-22 MED ORDER — METHOCARBAMOL 500 MG PO TABS: 500.0000 mg | ORAL_TABLET | Freq: Three times a day (TID) | ORAL | 0 refills | Status: DC | PRN

## 2018-03-22 NOTE — Progress Notes (Signed)
77 year old white male is 1 week status post C4-5 and C5-6 ACDF returns.  States he is doing well.  Preop arm symptoms greatly improved.  He discontinued oxycodone since this was causing him to stay awake and be jittery.  Currently using hydrocodone that he has had leftover at home.  Also has tizanidine for muscle spasms that he uses at night due to this causing sedation.  Planes of dysphagia or difficulty breathing.   Exam Surgical incision is well-healed.  No acute distress.  Very pleasant white male.  Neurologically intact.   Plan Advised patient that he must continue cervical collar until 6-week postop appointment with Dr. Ophelia CharterYates.  No driving.  No lifting, pushing, pulling.  Stressed him the importance of being compliant with instructions.

## 2018-04-25 ENCOUNTER — Other Ambulatory Visit (INDEPENDENT_AMBULATORY_CARE_PROVIDER_SITE_OTHER): Payer: Self-pay | Admitting: Surgery

## 2018-04-25 NOTE — Telephone Encounter (Signed)
Ok for # 20 tabs then needs to stop . Thanks ucall  One po bid prn thanks robaxin 500mg 

## 2018-04-25 NOTE — Telephone Encounter (Signed)
I called patient and advised. Script sent to pharmacy.  

## 2018-04-25 NOTE — Telephone Encounter (Signed)
Please advise. OK for refill? 

## 2018-04-28 ENCOUNTER — Ambulatory Visit (INDEPENDENT_AMBULATORY_CARE_PROVIDER_SITE_OTHER): Payer: Medicare Other

## 2018-04-28 ENCOUNTER — Ambulatory Visit (INDEPENDENT_AMBULATORY_CARE_PROVIDER_SITE_OTHER): Payer: Medicare Other | Admitting: Orthopaedic Surgery

## 2018-04-28 ENCOUNTER — Encounter (INDEPENDENT_AMBULATORY_CARE_PROVIDER_SITE_OTHER): Payer: Self-pay | Admitting: Orthopaedic Surgery

## 2018-04-28 VITALS — BP 138/76 | HR 79 | Ht 69.0 in | Wt 169.0 lb

## 2018-04-28 DIAGNOSIS — M4712 Other spondylosis with myelopathy, cervical region: Secondary | ICD-10-CM

## 2018-04-28 DIAGNOSIS — S129XXA Fracture of neck, unspecified, initial encounter: Secondary | ICD-10-CM | POA: Insufficient documentation

## 2018-04-28 NOTE — Progress Notes (Addendum)
Office Visit Note   Patient: Jason Friedman.           Date of Birth: 21-Jan-1941           MRN: [id number] Visit Date: 04/28/2018              Requested by: Marden Noble, MD [address] [place], [place] [address] PCP: Marden Noble, MD   Assessment & Plan: Visit Diagnoses:  1. Other spondylosis with myelopathy, cervical region   2. Pseudoarthrosis of cervical spine, initial encounter [facility])     Plan: Patient neurologically improved with improvement in his myelopathy improved gait and improved strength.  He is no longer falling not using a walking aid.  Of concern is the significant motion on flexion-extension.  He will require posterior fusion and stabilization with interspinous wiring, iliac crest bone marrow aspiration.  His excessive motion is likely related to an old posterior ligamentous injury that occurred when he was young and may have contributed to his progressive disc degeneration with abnormal cord signal.  We discussed overnight stay in the hospital.  He has a child getting married at the end of the month.  Procedure discussed questions elicited and answered he understands request to proceed.  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  Wire will be passed underneath C7 up around C4 for stabilization.  Follow-Up Instructions: No follow-ups on file.   Orders:  Orders Placed This Encounter  Procedures  . XR Cervical Spine 2 or 3 views   No orders of the defined types were placed in this encounter.     Procedures: No procedures performed   Clinical Data: No additional findings.   Subjective: Chief Complaint  Patient presents with  . Neck - Follow-up    03/15/18 C4-5, C5-6 ACDF, Allograft, Plate    HPI 78 year old male returns post C4-5 C5-6 cervical fusion.  He is worn his collar exactly as recommended.  He had past history of injury to his neck with fractures in the past and  interspinous ligament disruption with calcification at C5-6.  He states his myelopathy is improved and is now noted that he can walk up steps but he cannot walk down them since he is concerned he might fall.  His wide-based gait is improved numbness and tingling his hand is significantly improved.  He returns today for flexion-extension x-rays to assess his fusion. He is now able to walk without use of a cane. Review of Systems positive for neck fracture when he was in high school treated conservatively.  Patient's a non-smoker.  Previous right total hip arthroplasty, left total shoulder arthroplasty.  Possible glaucoma sleep apnea hypertension thyroid condition.  Myelopathic gait that was present since summertime 2019 and progressed.   Objective: Vital Signs: BP 138/76   Pulse 79   Ht 5\' 9"  (1.753 m)   Wt 169 lb (76.7 kg)   BMI 24.96 kg/m   Physical Exam Constitutional:      Appearance: He is well-developed.  HENT:     Head: Normocephalic and atraumatic.  Eyes:     Pupils: Pupils are equal, round, and reactive to light.  Neck:     Thyroid: No thyromegaly.     Trachea: No tracheal deviation.  Cardiovascular:     Rate and Rhythm: Normal rate.  Pulmonary:     Effort: Pulmonary effort is normal.     Breath sounds: No wheezing.  Abdominal:     General: Bowel sounds are normal.     Palpations: Abdomen is soft.  Skin:    General: Skin is warm and dry.     Capillary Refill: Capillary refill takes less than 2 seconds.  Neurological:     Mental Status: He is alert and oriented to person, place, and time.  Psychiatric:        Behavior: Behavior normal.        Thought Content: Thought content normal.        Judgment: Judgment normal.     Ortho Exam well-healed anterior incision.  He has been shaving over his face but not over his neck.  Biceps triceps are strong with resisted testing.  He can ambulate with a rapid gait no longer falls right or left with ambulation does not have to  grab the wall.  Patient still has 3+ lower extremity reflexes.  Improved quad and calf strength with normal resistance.  Specialty Comments:  No specialty comments available.  Imaging: No results found.   PMFS History: Patient Active Problem List   Diagnosis Date Noted  . Cervical stenosis of spine 03/15/2018  . Other spondylosis with myelopathy, cervical region 03/09/2018  . 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 OA (osteoarthritis) of hip 08/23/2011   Past Medical History:  Diagnosis Date  . Anemia   . Arthritis   . Cervical spinal stenosis   . Cervical spine fracture (HCC) 2  . Cervical vertebral fracture (HCC) 25 yrs ago   C 5   . 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 COPD (chronic obstructive pulmonary disease) (HCC)    mild copd per lov note dr Meredeth Ide 04-16-11 on chart  . Difficult intubation    difficult d/t anterior larynx, successful intubation using Glidescope 04/07/17  . Fracture of cervical vertebrae, multiple (HCC)  in high school   2 cervical vertebrae fx, 3 dislocated cervical vertebrae  . Heart abnormality    heart anatomicall rotated, causes abnormal ekg  . Hypertension    hx on, none current  . Hypogonadism in male   . Hypothyroidism   . Impaired memory    x 2 yrs ago after neck injury  . Insomnia   . PONV (postoperative nausea and vomiting)    likes scopolamine patch, vertigo ,pt. reports that anesthesia "has had problem finding my airway"  . Prostate irregularity    nonfuctional prostate, on testerone gel bid  . Sigmoid diverticulitis   . Sleep apnea    does not use due to weight loss, does not need now, not able to use CPAP, last sleep study- 2018    Family History  Problem Relation Age of Onset  . Breast cancer Mother   . Prostate cancer Father   . Diabetes Sister   . Breast cancer Sister   . Diabetes Sister   . Colon cancer Sister     Past Surgical History:  Procedure  Laterality Date  . ANTERIOR CERVICAL DECOMP/DISCECTOMY FUSION N/A 03/15/2018   Procedure: CERVICAL FOUR-FIVE, CERVICAL FIVE-SIX ANTERIOR CERVICAL DECOMPRESSION/DISCECTOMY FUSION ALLOGRAFT AND PLATE;  Surgeon: Eldred Manges, MD;  Location: [facility] OR;  Service: Orthopedics;  Laterality: N/A;  CERVICAL FOUR-FIVE, CERVICAL FIVE-SIX ANTERIOR CERVICAL DECOMPRESSION/DISCECTOMY FUSION ALLOGRAFT AND PLATE  . piliodional cyst  yrs ago   done x 3  . right shoulder arthroscopy  Mar 02, 2011  . TONSILLECTOMY    . TOTAL HIP ARTHROPLASTY  08/23/2011  Procedure: TOTAL HIP ARTHROPLASTY;  Surgeon: Loanne DrillingFrank V Aluisio, MD;  Location: [facility] ORS;  Service: Orthopedics;  Laterality: Right;  . TOTAL SHOULDER ARTHROPLASTY Left 04/07/2017   Procedure: LEFT TOTAL SHOULDER ARTHROPLASTY;  Surgeon: Francena HanlySupple, Kevin, MD;  Location: [facility] OR;  Service: Orthopedics;  Laterality: Left;   Social History   Occupational History  . Occupation: Retired  Tobacco Use  . Smoking status: Never Smoker  . Smokeless tobacco: Never Used  Substance and Sexual Activity  . Alcohol use: Yes    Alcohol/week: 2.0 - 3.0 standard drinks    Types: 2 - 3 Cans of beer per week    Comment: rare use of whiskey- q 6 months   . Drug use: No  . Sexual activity: Not on file

## 2018-05-03 NOTE — Pre-Procedure Instructions (Signed)
Jason HomansBrooks J Lomax Jr.  05/03/2018      [id number] [place]- [place], [place] - [address] [place] [place] [id number] Phone: [phone] Fax: [phone]    Your procedure is scheduled on Monday, January 13th  [facility].  Call this number if you have problems the morning of surgery:  [phone]   Remember:  Do not eat or drink after midnight.     Take these medicines the morning of surgery with A SIP OF WATER   Brinzolamide-Brimonidine (SIMBRINZA) cromolyn (OPTICROM) fluticasone (FLONASE) HYDROcodone-acetaminophen (NORCO/VICODIN) -as needed levothyroxine (SYNTHROID, LEVOTHROID) meclizine (ANTIVERT) - as needed methocarbamol (ROBAXIN) - as needed predniSONE (DELTASONE)   Follow your surgeon's instructions on when to stop Aspirin.  If no instructions were given by your surgeon then you will need to call the office to get those instructions.    As of today, STOP taking any Aspirin (unless otherwise instructed by your surgeon), Aleve, Naproxen, Ibuprofen, Motrin, Advil, Goody's, BC's, all herbal medications, fish oil, and all vitamins.    Do not wear jewelry, make-up or nail polish.  Do not wear lotions, powders, or perfumes, or deodorant.  Do not shave 48 hours prior to surgery.  Men may shave face and neck.  Do not bring valuables to the hospital.  [facility] is not responsible for any belongings or valuables.  Contacts, dentures or bridgework may not be worn into surgery.  Leave your suitcase in the car.  After surgery it may be brought to your room.  For patients admitted to the hospital, discharge time will be determined by your treatment team.  Patients discharged the day of surgery will not be allowed to drive home.   Special instructions:  [place]- Preparing For Surgery  Before surgery, you can play an important role. Because skin is not sterile, your skin needs to be as free of germs as  possible. You can reduce the number of germs on your skin by washing with CHG (chlorahexidine gluconate) Soap before surgery.  CHG is an antiseptic cleaner which kills germs and bonds with the skin to continue killing germs even after washing.    Oral Hygiene is also important to reduce your risk of infection.  Remember - BRUSH YOUR TEETH THE MORNING OF SURGERY WITH YOUR REGULAR TOOTHPASTE  Please do not use if you have an allergy to CHG or antibacterial soaps. If your skin becomes reddened/irritated stop using the CHG.  Do not shave (including legs and underarms) for at least 48 hours prior to first CHG shower. It is OK to shave your face.  Please follow these instructions carefully.   1. Shower the NIGHT BEFORE SURGERY and the MORNING OF SURGERY with CHG.   2. If you chose to wash your hair, wash your hair first as usual with your normal shampoo.  3. After you shampoo, rinse your hair and body thoroughly to remove the shampoo.  4. Use CHG as you would any other liquid soap. You can apply CHG directly to the skin and wash gently with a scrungie or a clean washcloth.   5. Apply the CHG Soap to your body ONLY FROM THE NECK DOWN.  Do not use on open wounds or open sores. Avoid contact with your eyes, ears, mouth and genitals (private parts). Wash Face and genitals (private parts)  with your normal soap.  6. Wash thoroughly, paying special attention to the area where your surgery will be performed.  7. Thoroughly rinse your body with warm water from the neck down.  8. DO NOT shower/wash with your normal soap after using and rinsing off the CHG Soap.  9. Pat yourself dry with a CLEAN TOWEL.  10. Wear CLEAN PAJAMAS to bed the night before surgery, wear comfortable clothes the morning of surgery  11. Place CLEAN SHEETS on your bed the night of your first shower and DO NOT SLEEP WITH PETS.  Day of Surgery:  Do not apply any deodorants/lotions.  Please wear clean clothes to the  hospital/surgery center.   Remember to brush your teeth WITH YOUR REGULAR TOOTHPASTE.    Please read over the following fact sheets that you were given.

## 2018-05-04 ENCOUNTER — Other Ambulatory Visit: Payer: Self-pay

## 2018-05-04 ENCOUNTER — Encounter (HOSPITAL_COMMUNITY): Payer: Self-pay

## 2018-05-04 ENCOUNTER — Encounter (HOSPITAL_COMMUNITY)
Admission: RE | Admit: 2018-05-04 | Discharge: 2018-05-04 | Disposition: A | Discharge status: Home / Self Care | Payer: Medicare Other | Source: Ambulatory Visit | Attending: Orthopaedic Surgery | Admitting: Orthopaedic Surgery

## 2018-05-04 DIAGNOSIS — Z01818 Encounter for other preprocedural examination: Secondary | ICD-10-CM | POA: Diagnosis present

## 2018-05-04 LAB — COMPREHENSIVE METABOLIC PANEL
ALT: 17 U/L (ref 0–44)
AST: 26 U/L (ref 15–41)
Albumin: 3.6 g/dL (ref 3.5–5.0)
Alkaline Phosphatase: 37 U/L — ABNORMAL LOW (ref 38–126)
Anion gap: 7 (ref 5–15)
BUN: 18 mg/dL (ref 8–23)
CO2: 26 mmol/L (ref 22–32)
Calcium: 8.8 mg/dL — ABNORMAL LOW (ref 8.9–10.3)
Chloride: 107 mmol/L (ref 98–111)
Creatinine, Ser: 1.03 mg/dL (ref 0.61–1.24)
GFR calc Af Amer: >60 mL/min (ref >60)
GFR calc non Af Amer: >60 mL/min (ref >60)
Glucose, Bld: 107 mg/dL — ABNORMAL HIGH (ref 70–99)
POTASSIUM: 4.6 mmol/L (ref 3.5–5.1)
Sodium: 140 mmol/L (ref 135–145)
Total Bilirubin: 1 mg/dL (ref 0.3–1.2)
Total Protein: 6.1 g/dL — ABNORMAL LOW (ref 6.5–8.1)

## 2018-05-04 LAB — CBC
HCT: 45.2 % (ref 39.0–52.0)
Hemoglobin: 14.4 g/dL (ref 13.0–17.0)
MCH: 34 pg (ref 26.0–34.0)
MCHC: 31.9 g/dL (ref 30.0–36.0)
MCV: 106.6 fL — ABNORMAL HIGH (ref 80.0–100.0)
Platelets: 366 10*3/uL (ref 150–400)
RBC: 4.24 MIL/uL (ref 4.22–5.81)
RDW: 15.3 % (ref 11.5–15.5)
WBC: 11.1 10*3/uL — ABNORMAL HIGH (ref 4.0–10.5)
nRBC: 0 % (ref 0.0–0.2)

## 2018-05-04 LAB — SURGICAL PCR SCREEN
MRSA, PCR: NEGATIVE
Staphylococcus aureus: NEGATIVE

## 2018-05-04 LAB — URINALYSIS, ROUTINE W REFLEX MICROSCOPIC
Bilirubin Urine: NEGATIVE
Glucose, UA: NEGATIVE mg/dL
Hgb urine dipstick: NEGATIVE
Ketones, ur: 5 mg/dL — AB
Leukocytes, UA: NEGATIVE
Nitrite: NEGATIVE
Protein, ur: NEGATIVE mg/dL
Specific Gravity, Urine: 1.028 (ref 1.005–1.030)
pH: 5 (ref 5.0–8.0)

## 2018-05-04 NOTE — Progress Notes (Signed)
PCP: Marden Noble  DM: denies  SA: yes, does not wear CPAP  Pt denies SOB, cough, fever, chest pain  Pt stated understanding of instructions given for DOS.

## 2018-05-05 NOTE — Anesthesia Preprocedure Evaluation (Deleted)
Anesthesia Evaluation  Patient identified by MRN, date of birth, ID band Patient awake    Reviewed: Allergy & Precautions, NPO status , Patient's Chart, lab work & pertinent test results  History of Anesthesia Complications (+) PONV, DIFFICULT AIRWAY and history of anesthetic complications  Airway Mallampati: II  TM Distance: >3 FB Neck ROM: Limited    Dental no notable dental hx. (+) Teeth Intact, Dental Advisory Given   Pulmonary sleep apnea (does not use CPAP) , COPD,    Pulmonary exam normal breath sounds clear to auscultation       Cardiovascular hypertension, Normal cardiovascular exam Rhythm:Regular Rate:Normal  Echo 05/19/16: - Normal LV size with EF 55-60%. Normal RV size and systolicfunction. No significant valvular abnormalities.   Neuro/Psych    GI/Hepatic negative GI ROS, Neg liver ROS,   Endo/Other  Hypothyroidism   Renal/GU   negative genitourinary   Musculoskeletal  (+) Arthritis , Osteoarthritis,    Abdominal   Peds  Hematology negative hematology ROS (+)   Anesthesia Other Findings Most recent airway note: - 03/15/18: IV induction, mask ventilation without difficulty. Grade I. Glidescope and 4. Ridgid stylet and Video-laryngoscopy. Oral-tracheal intubation. 1 attempt. 8.0 mm ETT. Difficulty anticipated due to history and neck instability. Comments: Elective glidescope per surgeon due to cervical myelopathy.  Reproductive/Obstetrics                                                            Anesthesia Evaluation  Patient identified by MRN, date of birth, ID band Patient awake    Reviewed: Allergy & Precautions, NPO status , Patient's Chart, lab work & pertinent test results  History of Anesthesia Complications (+) PONV and DIFFICULT AIRWAY  Airway Mallampati: II  TM Distance: >3 FB Neck ROM: Full    Dental no notable dental hx. (+) Teeth Intact, Dental Advisory  Given   Pulmonary sleep apnea , COPD,    Pulmonary exam normal breath sounds clear to auscultation       Cardiovascular hypertension, Pt. on medications Normal cardiovascular exam Rhythm:Regular Rate:Normal  Echo 05/19/16 Left ventricle: The cavity size was normal. Wall thickness was   normal. Systolic function was normal. The estimated ejection   fraction was in the range of 55% to 60%. Wall motion was normal;   there were no regional wall motion abnormalities. Doppler   parameters are consistent with abnormal left ventricular   relaxation (grade 1 diastolic dysfunction). - Aortic valve: There was no stenosis. - Aorta: Borderline dilated aortic root. Aortic root dimension: 37   mm (ED). - Mitral valve: There was no significant regurgitation. - Right ventricle: The cavity size was normal. Systolic function   was normal. - Pulmonary arteries: No complete TR doppler jet so unable to   estimate PA systolic pressure. - Inferior vena cava: The vessel was normal in size. The   respirophasic diameter changes were in the normal range (= 50%),   consistent with normal central venous pressure.   Neuro/Psych  Neuromuscular disease negative psych ROS   GI/Hepatic Neg liver ROS,   Endo/Other  Hypothyroidism   Renal/GU negative Renal ROS     Musculoskeletal  (+) Arthritis ,   Abdominal   Peds negative pediatric ROS (+)  Hematology  (+) Blood dyscrasia, anemia ,   Anesthesia Other Findings  Reproductive/Obstetrics                            Lab Results  Component Value Date   CREATININE 1.03 05/04/2018   BUN 18 05/04/2018   NA 140 05/04/2018   K 4.6 05/04/2018   CL 107 05/04/2018   CO2 26 05/04/2018    Lab Results  Component Value Date   WBC 11.1 (H) 05/04/2018   HGB 14.4 05/04/2018   HCT 45.2 05/04/2018   MCV 106.6 (H) 05/04/2018   PLT 366 05/04/2018   Anesthesia Physical Anesthesia Plan  ASA: III  Anesthesia Plan: General    Post-op Pain Management:    Induction: Intravenous  PONV Risk Score and Plan: 2 and Treatment may vary due to age or medical condition, Ondansetron and Dexamethasone  Airway Management Planned: Video Laryngoscope Planned and Oral ETT  Additional Equipment:   Intra-op Plan:   Post-operative Plan: Extubation in OR  Informed Consent: I have reviewed the patients History and Physical, chart, labs and discussed the procedure including the risks, benefits and alternatives for the proposed anesthesia with the patient or authorized representative who has indicated his/her understanding and acceptance.   Dental advisory given  Plan Discussed with: CRNA  Anesthesia Plan Comments: (PAT note written 03/14/2018 by Shonna ChockAllison Zelenak, PA-C. )       Anesthesia Quick Evaluation  Anesthesia Physical Anesthesia Plan  ASA: III  Anesthesia Plan: General   Post-op Pain Management:    Induction: Intravenous  PONV Risk Score and Plan: 3 and Ondansetron, Dexamethasone and Treatment may vary due to age or medical condition  Airway Management Planned: Oral ETT and Video Laryngoscope Planned  Additional Equipment:   Intra-op Plan:   Post-operative Plan: Extubation in OR  Informed Consent: I have reviewed the patients History and Physical, chart, labs and discussed the procedure including the risks, benefits and alternatives for the proposed anesthesia with the patient or authorized representative who has indicated his/her understanding and acceptance.   Dental advisory given  Plan Discussed with: CRNA  Anesthesia Plan Comments: ( )     Anesthesia Quick Evaluation

## 2018-05-05 NOTE — Progress Notes (Signed)
Anesthesia Chart Review:  Case:  [id number] Date/Time:  05/08/18 1215   Procedure:  C4-C6 POSTERIOR CERVICAL FUSION, WIRING, ILIAC BONE MARROW ASPIRATE (N/A )   Anesthesia type:  General   Pre-op diagnosis:  CERVICAL PSEUDARTHROSIS   Location:  [facility] OR ROOM 05 / [place] [facility]   Surgeon:  Marybelle Killings, MD      DISCUSSION: DISCUSSION: Patient is a 78 year old Friedman scheduled for the above procedure. He is s/p C4-5, C5-6 ACDF 03/15/18.  History includes never smoker, post-operative N/V, DIFFICULT AIRWAY, HTN, hypothyroidism, cervical fractures/dislocation (neck injury in high school and >20 years ago), COPD (mild), OSA (intolerant to CPAP, had weight loss). Notes indicate patient reported prior history of abnormal EKG (possibly due elevated right hemidiaphragm with "rotated" heart). He was referred to cardiologist Dr. Minus Breeding in 04/2016 due to abnormal EKG. At that time he reported good exercise tolerance without CV symptoms, so PRN follow-up recommended pending echo results (see below). According to 02/28/18 note by Dinkins, Erasmo Downer, RN, "Received lab work from Dr. Inda Merlin office, [facility] [place]. Lab: Acetylcholine Receptor AB, all Result: Negative."  Anesthesia records re: DIFFICULT AIRWAY  - 03/15/18: IV induction, mask ventilation without difficulty. Grade I. Glidescope and 4. Ridgid stylet and Video-laryngoscopy. Oral-tracheal intubation. 1 attempt. 8.0 mm ETT. Difficulty anticipated due to history and neck instability. Comments: Elective glidescope per surgeon due to cervical myelopathy.  - 04/07/17: IV induction, mask ventilation without difficulty. Grade I. Glidescope and 3. Stylet and Oral airway. Oral-tracheal intubation. 2 attempts. 7.5 mm ETT. Difficulty anticipated due to history and anterior larynx. Future Recommendations:Recommend- induction with short-acting agent, and alternative techniques readily available Comments:DL x1 Mil 3 by SRNA, grade 4 view, DL x1 Mil 3 by CRNA, grade 4  view, Glide 3 x1, grade 1 view, ATOI. +BBS/etCO2.  - 08/23/11: Difficult airway?: (c) Yes Grade View: Grade 3 Placed By: Self;CRNA Airway Device: Endotracheal Tube Laryngoscope Blade: MAC;4 ETT Types: Oral Size (mm): 7.5 mm Cuffed: Cuffed    History of difficult airway, but successful intubations with Glidescope. He denied SOB, cough, fever, chest pain. PFTs stable at last pulmonary visit last year. EKG appears stable. If no acute changes then I anticipate that he can proceed as planned. According to his medication list, he is currently on prednisone 5 mg TID.     VS: BP 138/77   Pulse 69   Temp 36.7 C   Resp 20   Ht _0  (1.753 m)   Wt 81.3 kg   SpO2 99%   BMI 26.46 kg/m   PROVIDERS: Josetta Huddle, MD is PCP Wallene Huh, MD is pulmonologist [facility]; see Care Everywhere). Last visit 06/22/17 with 8 month follow-up recommended.  Star Age, MD is neurologist   LABS: Labs reviewed: Acceptable for surgery. (all labs ordered are listed, but only abnormal results are displayed)  Labs Reviewed  CBC - Abnormal; Notable for the following components:      Result Value   WBC 11.1 (*)    MCV 106.6 (*)    All other components within normal limits  COMPREHENSIVE METABOLIC PANEL - Abnormal; Notable for the following components:   Glucose, Bld 107 (*)    Calcium 8.8 (*)    Total Protein 6.1 (*)    Alkaline Phosphatase 37 (*)    All other components within normal limits  URINALYSIS, ROUTINE W REFLEX MICROSCOPIC - Abnormal; Notable for the following components:   Color, Urine AMBER (*)    Ketones, ur 5 (*)  All other components within normal limits  SURGICAL PCR SCREEN    Spirometry 06/22/17 ([place]): SPIROMETRY: FVC was 3.Jason liters, 90% of predicted FEV1 was 2.31, 82% of predicted FEV1 ratio was 71 FEF Jason-75% liters per second was 53% of predicted FLOW VOLUME LOOP: Mildly delayed expiratory flow volume loop Impression Mild  obstruction/copd  Compared to Previous Study, numbers are consistent.   EKG:05/04/18: NSR.    CV: Echo 05/19/16: Study Conclusions - Left ventricle: The cavity size was normal. Wall thickness was normal. Systolic function was normal. The estimated ejection fraction was in the range of 55% to 60%. Wall motion was normal; there were no regional wall motion abnormalities. Doppler parameters are consistent with abnormal left ventricular relaxation (grade 1 diastolic dysfunction). - Aortic valve: There was no stenosis. - Aorta: Borderline dilated aortic root. Aortic root dimension: 37 mm (ED). - Mitral valve: There was no significant regurgitation. - Right ventricle: The cavity size was normal. Systolic function was normal. - Pulmonary arteries: No complete TR doppler jet so unable to estimate PA systolic pressure. - Inferior vena cava: The vessel was normal in size. The respirophasic diameter changes were in the normal range (= 50%), consistent with normal central venous pressure. Impressions: - Normal LV size with EF 55-60%. Normal RV size and systolic function. No significant valvular abnormalities.   Past Medical History:  Diagnosis Date  . Anemia   . Arthritis   . Cervical spinal stenosis   . Cervical spine fracture ([place]) 2  . Cervical vertebral fracture ([facility]) Jason yrs ago   C 5   . 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 COPD (chronic obstructive pulmonary disease) ([place])    mild copd per lov note dr Raul Del 04-16-11 on chart  . Difficult intubation    difficult d/t anterior larynx, successful intubation using Glidescope 04/07/17  . Fracture of cervical vertebrae, multiple (HCC)  in high school   2 cervical vertebrae fx, 3 dislocated cervical vertebrae  . Heart abnormality    heart anatomicall rotated, causes abnormal ekg  . Hypertension    hx on, none current  . 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  Hypothyroidism   . Impaired memory    x 2 yrs ago after neck injury  . Insomnia   . PONV (postoperative nausea and vomiting)    likes scopolamine patch, vertigo ,pt. reports that anesthesia "has had problem finding my airway"  . Prostate irregularity    nonfuctional prostate, on testerone gel bid  . Sigmoid diverticulitis   . Sleep apnea    does not use due to weight loss, does not need now, not able to use CPAP, last sleep study- 2018    Past Surgical History:  Procedure Laterality Date  . ANTERIOR CERVICAL DECOMP/DISCECTOMY FUSION N/A 03/15/2018   Procedure: CERVICAL FOUR-FIVE, CERVICAL FIVE-SIX ANTERIOR CERVICAL DECOMPRESSION/DISCECTOMY FUSION ALLOGRAFT AND PLATE;  Surgeon: Marybelle Killings, MD;  Location: [facility];  Service: Orthopedics;  Laterality: N/A;  CERVICAL FOUR-FIVE, CERVICAL FIVE-SIX ANTERIOR CERVICAL DECOMPRESSION/DISCECTOMY FUSION ALLOGRAFT AND PLATE  . piliodional cyst  yrs ago   done x 3  . right shoulder arthroscopy  Mar 02, 2011  . TONSILLECTOMY    . TOTAL HIP ARTHROPLASTY  08/23/2011   Procedure: TOTAL HIP ARTHROPLASTY;  Surgeon: Gearlean Alf, MD;  Location: [facility];  Service: Orthopedics;  Laterality: Right;  . TOTAL SHOULDER ARTHROPLASTY Left 04/07/2017   Procedure: LEFT TOTAL SHOULDER ARTHROPLASTY;  Surgeon: Onnie Graham,  Lennette Bihari, MD;  Location: [facility];  Service: Orthopedics;  Laterality: Left;    MEDICATIONS: . ANDROGEL PUMP 20.Jason MG/ACT (1.62%) GEL  . Brinzolamide-Brimonidine (SIMBRINZA) 1-0.2 % SUSP  . cromolyn (OPTICROM) 4 % ophthalmic solution  . doxycycline (VIBRAMYCIN) 100 MG capsule  . fluticasone (FLONASE) 50 MCG/ACT nasal spray  . HYDROcodone-acetaminophen (NORCO/VICODIN) 5-325 MG tablet  . levothyroxine (SYNTHROID, LEVOTHROID) 137 MCG tablet  . losartan (COZAAR) Jason MG tablet  . meclizine (ANTIVERT) Jason MG tablet  . methocarbamol (ROBAXIN) 500 MG tablet  . naproxen sodium (ANAPROX) 550 MG tablet  . predniSONE (DELTASONE) 5 MG tablet  . sildenafil (REVATIO) 20  MG tablet  . tiZANidine (ZANAFLEX) 4 MG tablet   No current facility-administered medications for this encounter.     Myra Gianotti, PA-C Surgical Short Stay/[facility] [address] Phone [phone] [facility] Phone [phone] 05/05/2018 10:02 AM

## 2018-05-08 ENCOUNTER — Ambulatory Visit (HOSPITAL_COMMUNITY): Payer: Medicare Other | Admitting: Anesthesiology

## 2018-05-08 ENCOUNTER — Encounter (HOSPITAL_COMMUNITY): Payer: Self-pay

## 2018-05-08 ENCOUNTER — Ambulatory Visit (HOSPITAL_COMMUNITY): Payer: Medicare Other

## 2018-05-08 ENCOUNTER — Other Ambulatory Visit: Payer: Self-pay

## 2018-05-08 ENCOUNTER — Observation Stay (HOSPITAL_COMMUNITY)
Admission: RE | Admit: 2018-05-08 | Discharge: 2018-05-09 | Disposition: A | Discharge status: Home / Self Care | Payer: Medicare Other | Source: Ambulatory Visit | Attending: Orthopaedic Surgery | Admitting: Orthopaedic Surgery

## 2018-05-08 ENCOUNTER — Ambulatory Visit (HOSPITAL_COMMUNITY): Payer: Medicare Other | Admitting: Vascular Surgery

## 2018-05-08 ENCOUNTER — Encounter (HOSPITAL_COMMUNITY)
Admission: RE | Disposition: A | Discharge status: Home / Self Care | Payer: Self-pay | Source: Ambulatory Visit | Attending: Orthopaedic Surgery

## 2018-05-08 DIAGNOSIS — M1990 Unspecified osteoarthritis, unspecified site: Secondary | ICD-10-CM | POA: Insufficient documentation

## 2018-05-08 DIAGNOSIS — S129XXD Fracture of neck, unspecified, subsequent encounter: Secondary | ICD-10-CM

## 2018-05-08 DIAGNOSIS — I10 Essential (primary) hypertension: Secondary | ICD-10-CM | POA: Insufficient documentation

## 2018-05-08 DIAGNOSIS — M4712 Other spondylosis with myelopathy, cervical region: Secondary | ICD-10-CM

## 2018-05-08 DIAGNOSIS — G4730 Sleep apnea, unspecified: Secondary | ICD-10-CM | POA: Diagnosis not present

## 2018-05-08 DIAGNOSIS — M532X2 Spinal instabilities, cervical region: Secondary | ICD-10-CM | POA: Diagnosis present

## 2018-05-08 DIAGNOSIS — Z419 Encounter for procedure for purposes other than remedying health state, unspecified: Secondary | ICD-10-CM

## 2018-05-08 DIAGNOSIS — G4700 Insomnia, unspecified: Secondary | ICD-10-CM | POA: Insufficient documentation

## 2018-05-08 DIAGNOSIS — Z882 Allergy status to sulfonamides status: Secondary | ICD-10-CM | POA: Insufficient documentation

## 2018-05-08 DIAGNOSIS — Z981 Arthrodesis status: Secondary | ICD-10-CM | POA: Insufficient documentation

## 2018-05-08 DIAGNOSIS — J449 Chronic obstructive pulmonary disease, unspecified: Secondary | ICD-10-CM | POA: Insufficient documentation

## 2018-05-08 DIAGNOSIS — M960 Pseudarthrosis after fusion or arthrodesis: Principal | ICD-10-CM | POA: Insufficient documentation

## 2018-05-08 DIAGNOSIS — Z79899 Other long term (current) drug therapy: Secondary | ICD-10-CM | POA: Insufficient documentation

## 2018-05-08 DIAGNOSIS — Z881 Allergy status to other antibiotic agents status: Secondary | ICD-10-CM | POA: Insufficient documentation

## 2018-05-08 DIAGNOSIS — E039 Hypothyroidism, unspecified: Secondary | ICD-10-CM | POA: Insufficient documentation

## 2018-05-08 DIAGNOSIS — M4802 Spinal stenosis, cervical region: Secondary | ICD-10-CM

## 2018-05-08 DIAGNOSIS — S129XXA Fracture of neck, unspecified, initial encounter: Secondary | ICD-10-CM | POA: Diagnosis present

## 2018-05-08 HISTORY — PX: POSTERIOR CERVICAL FUSION/FORAMINOTOMY: SHX5038

## 2018-05-08 SURGERY — POSTERIOR CERVICAL FUSION/FORAMINOTOMY LEVEL 2
Anesthesia: General | Site: Spine Cervical

## 2018-05-08 MED ORDER — BUPIVACAINE HCL (PF) 0.5 % IJ SOLN
INTRAMUSCULAR | Status: AC
  Filled 2018-05-08: qty 30

## 2018-05-08 MED ORDER — ROCURONIUM BROMIDE 50 MG/5ML IV SOSY
PREFILLED_SYRINGE | INTRAVENOUS | Status: AC
  Filled 2018-05-08: qty 5

## 2018-05-08 MED ORDER — LACTATED RINGERS IV SOLN
INTRAVENOUS | Status: DC
  Administered 2018-05-08: 13:00:00 via INTRAVENOUS

## 2018-05-08 MED ORDER — MECLIZINE HCL 25 MG PO TABS
25.0000 mg | ORAL_TABLET | Freq: Two times a day (BID) | ORAL | Status: DC | PRN
  Filled 2018-05-08: qty 1

## 2018-05-08 MED ORDER — FENTANYL CITRATE (PF) 100 MCG/2ML IJ SOLN
INTRAMUSCULAR | Status: DC | PRN
  Administered 2018-05-08 (×5): 50 ug via INTRAVENOUS

## 2018-05-08 MED ORDER — GLYCOPYRROLATE PF 0.2 MG/ML IJ SOSY
PREFILLED_SYRINGE | INTRAMUSCULAR | Status: DC | PRN
  Administered 2018-05-08: .1 mg via INTRAVENOUS

## 2018-05-08 MED ORDER — FLUTICASONE PROPIONATE 50 MCG/ACT NA SUSP
2.0000 | Freq: Every day | NASAL | Status: DC
  Filled 2018-05-08: qty 16

## 2018-05-08 MED ORDER — EPINEPHRINE PF 1 MG/ML IJ SOLN
INTRAMUSCULAR | Status: AC
  Filled 2018-05-08: qty 1

## 2018-05-08 MED ORDER — FENTANYL CITRATE (PF) 100 MCG/2ML IJ SOLN
25.0000 ug | INTRAMUSCULAR | Status: DC | PRN
  Administered 2018-05-08 (×2): 50 ug via INTRAVENOUS

## 2018-05-08 MED ORDER — METHOCARBAMOL 1000 MG/10ML IJ SOLN
500.0000 mg | Freq: Four times a day (QID) | INTRAVENOUS | Status: DC | PRN
  Filled 2018-05-08: qty 5

## 2018-05-08 MED ORDER — DEXAMETHASONE SODIUM PHOSPHATE 4 MG/ML IJ SOLN
INTRAMUSCULAR | Status: DC | PRN
  Administered 2018-05-08: 10 mg via INTRAVENOUS

## 2018-05-08 MED ORDER — SUGAMMADEX SODIUM 200 MG/2ML IV SOLN
INTRAVENOUS | Status: DC | PRN
  Administered 2018-05-08: 200 mg via INTRAVENOUS

## 2018-05-08 MED ORDER — DEXAMETHASONE SODIUM PHOSPHATE 10 MG/ML IJ SOLN
INTRAMUSCULAR | Status: AC
  Filled 2018-05-08: qty 1

## 2018-05-08 MED ORDER — LIDOCAINE 2% (20 MG/ML) 5 ML SYRINGE
INTRAMUSCULAR | Status: DC | PRN
  Administered 2018-05-08: 100 mg via INTRAVENOUS

## 2018-05-08 MED ORDER — NON FORMULARY: Freq: Three times a day (TID) | Status: DC

## 2018-05-08 MED ORDER — POLYETHYLENE GLYCOL 3350 17 G PO PACK
17.0000 g | PACK | Freq: Every day | ORAL | Status: DC
  Filled 2018-05-08: qty 1

## 2018-05-08 MED ORDER — PROPOFOL 10 MG/ML IV BOLUS
INTRAVENOUS | Status: DC | PRN
  Administered 2018-05-08: 200 mg via INTRAVENOUS

## 2018-05-08 MED ORDER — HYDROMORPHONE HCL 1 MG/ML IJ SOLN: 0.5000 mg | INTRAMUSCULAR | Status: DC | PRN

## 2018-05-08 MED ORDER — SODIUM CHLORIDE 0.9 % IV SOLN: INTRAVENOUS | Status: DC

## 2018-05-08 MED ORDER — DOCUSATE SODIUM 100 MG PO CAPS
100.0000 mg | ORAL_CAPSULE | Freq: Two times a day (BID) | ORAL | Status: DC
  Administered 2018-05-08: 100 mg via ORAL
  Filled 2018-05-08: qty 1

## 2018-05-08 MED ORDER — VANCOMYCIN HCL 1000 MG IV SOLR
INTRAVENOUS | Status: DC | PRN
  Administered 2018-05-08: 1000 mg via INTRAVENOUS

## 2018-05-08 MED ORDER — PHENYLEPHRINE 40 MCG/ML (10ML) SYRINGE FOR IV PUSH (FOR BLOOD PRESSURE SUPPORT)
PREFILLED_SYRINGE | INTRAVENOUS | Status: DC | PRN
  Administered 2018-05-08: 120 ug via INTRAVENOUS
  Administered 2018-05-08 (×2): 80 ug via INTRAVENOUS
  Administered 2018-05-08: 120 ug via INTRAVENOUS
  Administered 2018-05-08 (×3): 80 ug via INTRAVENOUS

## 2018-05-08 MED ORDER — PROPOFOL 10 MG/ML IV BOLUS
INTRAVENOUS | Status: AC
  Filled 2018-05-08: qty 20

## 2018-05-08 MED ORDER — LACTATED RINGERS IV SOLN
INTRAVENOUS | Status: DC | PRN
  Administered 2018-05-08 (×2): via INTRAVENOUS

## 2018-05-08 MED ORDER — MENTHOL 3 MG MT LOZG: 1.0000 | LOZENGE | OROMUCOSAL | Status: DC | PRN

## 2018-05-08 MED ORDER — PHENYLEPHRINE 40 MCG/ML (10ML) SYRINGE FOR IV PUSH (FOR BLOOD PRESSURE SUPPORT)
PREFILLED_SYRINGE | INTRAVENOUS | Status: AC
  Filled 2018-05-08: qty 10

## 2018-05-08 MED ORDER — PHENYLEPHRINE 40 MCG/ML (10ML) SYRINGE FOR IV PUSH (FOR BLOOD PRESSURE SUPPORT)
PREFILLED_SYRINGE | INTRAVENOUS | Status: AC
  Filled 2018-05-08: qty 20

## 2018-05-08 MED ORDER — FENTANYL CITRATE (PF) 100 MCG/2ML IJ SOLN
INTRAMUSCULAR | Status: AC
  Administered 2018-05-08: 50 ug via INTRAVENOUS
  Filled 2018-05-08: qty 2

## 2018-05-08 MED ORDER — ONDANSETRON HCL 4 MG/2ML IJ SOLN: 4.0000 mg | Freq: Four times a day (QID) | INTRAMUSCULAR | Status: DC | PRN

## 2018-05-08 MED ORDER — ARTIFICIAL TEARS OPHTHALMIC OINT
TOPICAL_OINTMENT | OPHTHALMIC | Status: DC | PRN
  Administered 2018-05-08: 1 via OPHTHALMIC

## 2018-05-08 MED ORDER — SODIUM CHLORIDE 0.9% FLUSH: 3.0000 mL | INTRAVENOUS | Status: DC | PRN

## 2018-05-08 MED ORDER — VANCOMYCIN HCL 500 MG IV SOLR
INTRAVENOUS | Status: DC | PRN
  Administered 2018-05-08: 500 mg via TOPICAL

## 2018-05-08 MED ORDER — CHLORHEXIDINE GLUCONATE 4 % EX LIQD: 60.0000 mL | Freq: Once | CUTANEOUS | Status: DC

## 2018-05-08 MED ORDER — VANCOMYCIN HCL IN DEXTROSE 1-5 GM/200ML-% IV SOLN
1000.0000 mg | Freq: Once | INTRAVENOUS | Status: AC
  Administered 2018-05-09: 1000 mg via INTRAVENOUS
  Filled 2018-05-08: qty 200

## 2018-05-08 MED ORDER — ACETAMINOPHEN 650 MG RE SUPP: 650.0000 mg | RECTAL | Status: DC | PRN

## 2018-05-08 MED ORDER — VANCOMYCIN HCL IN DEXTROSE 1-5 GM/200ML-% IV SOLN
INTRAVENOUS | Status: AC
  Administered 2018-05-08: 1000 mg via INTRAVENOUS
  Filled 2018-05-08: qty 200

## 2018-05-08 MED ORDER — GLYCOPYRROLATE PF 0.2 MG/ML IJ SOSY
PREFILLED_SYRINGE | INTRAMUSCULAR | Status: AC
  Filled 2018-05-08: qty 1

## 2018-05-08 MED ORDER — ONDANSETRON HCL 4 MG/2ML IJ SOLN
INTRAMUSCULAR | Status: DC | PRN
  Administered 2018-05-08: 4 mg via INTRAVENOUS

## 2018-05-08 MED ORDER — VANCOMYCIN HCL 500 MG IV SOLR
INTRAVENOUS | Status: AC
  Filled 2018-05-08: qty 500

## 2018-05-08 MED ORDER — FENTANYL CITRATE (PF) 250 MCG/5ML IJ SOLN
INTRAMUSCULAR | Status: AC
  Filled 2018-05-08: qty 5

## 2018-05-08 MED ORDER — BUPIVACAINE-EPINEPHRINE (PF) 0.25% -1:200000 IJ SOLN
INTRAMUSCULAR | Status: AC
  Filled 2018-05-08: qty 30

## 2018-05-08 MED ORDER — ONDANSETRON HCL 4 MG PO TABS: 4.0000 mg | ORAL_TABLET | Freq: Four times a day (QID) | ORAL | Status: DC | PRN

## 2018-05-08 MED ORDER — ONDANSETRON HCL 4 MG/2ML IJ SOLN
INTRAMUSCULAR | Status: AC
  Filled 2018-05-08: qty 2

## 2018-05-08 MED ORDER — ROCURONIUM BROMIDE 50 MG/5ML IV SOSY
PREFILLED_SYRINGE | INTRAVENOUS | Status: DC | PRN
  Administered 2018-05-08: 20 mg via INTRAVENOUS
  Administered 2018-05-08: 50 mg via INTRAVENOUS

## 2018-05-08 MED ORDER — OXYCODONE HCL 5 MG PO TABS
5.0000 mg | ORAL_TABLET | ORAL | Status: DC | PRN
  Administered 2018-05-08 – 2018-05-09 (×3): 5 mg via ORAL
  Filled 2018-05-08 (×3): qty 1

## 2018-05-08 MED ORDER — SUCCINYLCHOLINE CHLORIDE 20 MG/ML IJ SOLN
INTRAMUSCULAR | Status: DC | PRN
  Administered 2018-05-08: 160 mg via INTRAVENOUS

## 2018-05-08 MED ORDER — LIDOCAINE 2% (20 MG/ML) 5 ML SYRINGE
INTRAMUSCULAR | Status: AC
  Filled 2018-05-08: qty 5

## 2018-05-08 MED ORDER — SODIUM CHLORIDE 0.9% FLUSH
3.0000 mL | Freq: Two times a day (BID) | INTRAVENOUS | Status: DC
  Administered 2018-05-08: 3 mL via INTRAVENOUS

## 2018-05-08 MED ORDER — BUPIVACAINE-EPINEPHRINE (PF) 0.25% -1:200000 IJ SOLN
INTRAMUSCULAR | Status: DC | PRN
  Administered 2018-05-08: 8.5 mL

## 2018-05-08 MED ORDER — ACETAMINOPHEN 325 MG PO TABS: 650.0000 mg | ORAL_TABLET | ORAL | Status: DC | PRN

## 2018-05-08 MED ORDER — EPHEDRINE 5 MG/ML INJ
INTRAVENOUS | Status: AC
  Filled 2018-05-08: qty 10

## 2018-05-08 MED ORDER — METHOCARBAMOL 500 MG PO TABS
500.0000 mg | ORAL_TABLET | Freq: Four times a day (QID) | ORAL | Status: DC | PRN
  Administered 2018-05-08 – 2018-05-09 (×2): 500 mg via ORAL
  Filled 2018-05-08 (×2): qty 1

## 2018-05-08 MED ORDER — EPHEDRINE SULFATE-NACL 50-0.9 MG/10ML-% IV SOSY
PREFILLED_SYRINGE | INTRAVENOUS | Status: DC | PRN
  Administered 2018-05-08: 10 mg via INTRAVENOUS
  Administered 2018-05-08: 15 mg via INTRAVENOUS

## 2018-05-08 MED ORDER — LOSARTAN POTASSIUM 50 MG PO TABS
25.0000 mg | ORAL_TABLET | Freq: Every day | ORAL | Status: DC
  Administered 2018-05-08: 25 mg via ORAL
  Filled 2018-05-08: qty 1

## 2018-05-08 MED ORDER — CROMOLYN SODIUM 4 % OP SOLN
1.0000 [drp] | Freq: Three times a day (TID) | OPHTHALMIC | Status: DC
  Filled 2018-05-08: qty 10

## 2018-05-08 MED ORDER — LEVOTHYROXINE SODIUM 137 MCG PO TABS
137.0000 ug | ORAL_TABLET | Freq: Every day | ORAL | Status: DC
  Administered 2018-05-09: 137 ug via ORAL
  Filled 2018-05-08: qty 1

## 2018-05-08 MED ORDER — SODIUM CHLORIDE 0.9 % IR SOLN
Status: DC | PRN
  Administered 2018-05-08: 1000 mL

## 2018-05-08 MED ORDER — PHENOL 1.4 % MT LIQD: 1.0000 | OROMUCOSAL | Status: DC | PRN

## 2018-05-08 MED ORDER — VANCOMYCIN HCL IN DEXTROSE 1-5 GM/200ML-% IV SOLN
1000.0000 mg | INTRAVENOUS | Status: AC
  Administered 2018-05-08: 1000 mg via INTRAVENOUS

## 2018-05-08 MED ORDER — SODIUM CHLORIDE 0.9 % IV SOLN
INTRAVENOUS | Status: DC | PRN
  Administered 2018-05-08: 40 ug/min via INTRAVENOUS

## 2018-05-08 SURGICAL SUPPLY — 63 items
BLADE CLIPPER SURG (BLADE) IMPLANT
BLADE SURG 15 STRL LF DISP TIS (BLADE) ×1 IMPLANT
BLADE SURG 15 STRL SS (BLADE) ×2
BUR ROUND FLUTED 4 SOFT TCH (BURR) ×4 IMPLANT
BUR ROUND FLUTED 4MM SOFT TCH (BURR) ×2
CABLE W/CRP 1.0 470 STRL (Cable) ×4 IMPLANT
CABLE W/CRP 1.0 470MM STERILE (Cable) ×2 IMPLANT
COLLAR CERV LO CONTOUR FIRM DE (SOFTGOODS) ×3 IMPLANT
CORDS BIPOLAR (ELECTRODE) ×3 IMPLANT
COVER SURGICAL LIGHT HANDLE (MISCELLANEOUS) ×3 IMPLANT
COVER WAND RF STERILE (DRAPES) ×3 IMPLANT
DERMABOND ADVANCED (GAUZE/BANDAGES/DRESSINGS) ×2
DERMABOND ADVANCED .7 DNX12 (GAUZE/BANDAGES/DRESSINGS) ×1 IMPLANT
DRAPE HALF SHEET 40X57 (DRAPES) ×3 IMPLANT
DRAPE SURG 17X23 STRL (DRAPES) ×24 IMPLANT
DRSG MEPILEX BORDER 4X4 (GAUZE/BANDAGES/DRESSINGS) ×3 IMPLANT
DRSG MEPILEX BORDER 4X8 (GAUZE/BANDAGES/DRESSINGS) ×3 IMPLANT
DRSG XEROFORM 1X8 (GAUZE/BANDAGES/DRESSINGS) ×6 IMPLANT
DURAPREP 26ML APPLICATOR (WOUND CARE) ×3 IMPLANT
ELECT BLADE 4.0 EZ CLEAN MEGAD (MISCELLANEOUS) ×3
ELECT REM PT RETURN 9FT ADLT (ELECTROSURGICAL) ×3
ELECTRODE BLDE 4.0 EZ CLN MEGD (MISCELLANEOUS) ×1 IMPLANT
ELECTRODE REM PT RTRN 9FT ADLT (ELECTROSURGICAL) ×1 IMPLANT
EVACUATOR 1/8 PVC DRAIN (DRAIN) IMPLANT
GAUZE SPONGE 4X4 12PLY STRL (GAUZE/BANDAGES/DRESSINGS) ×6 IMPLANT
GAUZE XEROFORM 5X9 LF (GAUZE/BANDAGES/DRESSINGS) ×3 IMPLANT
GLOVE BIOGEL PI IND STRL 8 (GLOVE) ×2 IMPLANT
GLOVE BIOGEL PI INDICATOR 8 (GLOVE) ×4
GLOVE ORTHO TXT STRL SZ7.5 (GLOVE) ×6 IMPLANT
GOWN STRL REUS W/ TWL LRG LVL3 (GOWN DISPOSABLE) ×1 IMPLANT
GOWN STRL REUS W/ TWL XL LVL3 (GOWN DISPOSABLE) ×1 IMPLANT
GOWN STRL REUS W/TWL 2XL LVL3 (GOWN DISPOSABLE) ×3 IMPLANT
GOWN STRL REUS W/TWL LRG LVL3 (GOWN DISPOSABLE) ×2
GOWN STRL REUS W/TWL XL LVL3 (GOWN DISPOSABLE) ×2
KIT BASIN OR (CUSTOM PROCEDURE TRAY) ×3 IMPLANT
KIT TURNOVER KIT B (KITS) ×3 IMPLANT
MANIFOLD NEPTUNE II (INSTRUMENTS) ×3 IMPLANT
NEEDLE 1/2 CIR MAYO (NEEDLE) IMPLANT
NEEDLE ASP BONE MRW 11GX15 J (NEEDLE) ×3 IMPLANT
NS IRRIG 1000ML POUR BTL (IV SOLUTION) ×3 IMPLANT
PACK ORTHO CERVICAL (CUSTOM PROCEDURE TRAY) ×3 IMPLANT
PAD ARMBOARD 7.5X6 YLW CONV (MISCELLANEOUS) ×6 IMPLANT
SPONGE LAP 4X18 RFD (DISPOSABLE) ×9 IMPLANT
SPONGE SURGIFOAM ABS GEL 100 (HEMOSTASIS) IMPLANT
STAPLER VISISTAT 35W (STAPLE) ×3 IMPLANT
STRIP BIOACTIVE VITOSS 25X100X (Neuro Prosthesis/Implant) ×3 IMPLANT
SUT BONE WAX W31G (SUTURE) ×3 IMPLANT
SUT STEEL 1 (SUTURE) IMPLANT
SUT STEEL 2 (SUTURE) IMPLANT
SUT VIC AB 0 CT1 27 (SUTURE) ×2
SUT VIC AB 0 CT1 27XBRD ANBCTR (SUTURE) ×1 IMPLANT
SUT VIC AB 2-0 CT1 27 (SUTURE) ×4
SUT VIC AB 2-0 CT1 TAPERPNT 27 (SUTURE) ×2 IMPLANT
SUT VIC AB 3-0 X1 27 (SUTURE) IMPLANT
SUT VICRYL 4-0 PS2 18IN ABS (SUTURE) IMPLANT
TAPE CLOTH SURG 4X10 WHT LF (GAUZE/BANDAGES/DRESSINGS) ×6 IMPLANT
TOWEL OR 17X24 6PK STRL BLUE (TOWEL DISPOSABLE) ×3 IMPLANT
TOWEL OR 17X26 10 PK STRL BLUE (TOWEL DISPOSABLE) ×3 IMPLANT
TRAY FOL W/BAG SLVR 16FR STRL (SET/KITS/TRAYS/PACK) ×1 IMPLANT
TRAY FOLEY MTR SLVR 16FR STAT (SET/KITS/TRAYS/PACK) IMPLANT
TRAY FOLEY W/BAG SLVR 16FR LF (SET/KITS/TRAYS/PACK) ×2
WATER STERILE IRR 1000ML POUR (IV SOLUTION) ×3 IMPLANT
YANKAUER SUCT BULB TIP NO VENT (SUCTIONS) ×3 IMPLANT

## 2018-05-08 NOTE — Anesthesia Procedure Notes (Signed)
Procedure Name: Intubation Date/Time: 05/08/2018 3:47 PM Performed by: Julian Reil, CRNA Pre-anesthesia Checklist: Patient identified, Emergency Drugs available, Suction available and Patient being monitored Patient Re-evaluated:Patient Re-evaluated prior to induction Oxygen Delivery Method: Circle system utilized Preoxygenation: Pre-oxygenation with 100% oxygen Induction Type: IV induction Ventilation: Mask ventilation without difficulty Laryngoscope Size: Glidescope and 4 Grade View: Grade I Tube type: Oral Tube size: 7.5 mm Number of attempts: 1 Airway Equipment and Method: Stylet and Video-laryngoscopy Placement Confirmation: ETT inserted through vocal cords under direct vision,  positive ETCO2 and breath sounds checked- equal and bilateral Secured at: 23 cm Tube secured with: Tape Dental Injury: Teeth and Oropharynx as per pre-operative assessment  Difficulty Due To: Difficulty was anticipated and Difficult Airway- due to reduced neck mobility Comments: 4x4s bite block used.

## 2018-05-08 NOTE — Interval H&P Note (Signed)
History and Physical Interval Note:  05/08/2018 3:17 PM  Jason Friedman.  has presented today for surgery, with the diagnosis of CERVICAL PSEUDARTHROSIS  The various methods of treatment have been discussed with the patient and family. After consideration of risks, benefits and other options for treatment, the patient has consented to  Procedure(s): C4-C6 POSTERIOR CERVICAL FUSION, WIRING, ILIAC BONE MARROW ASPIRATE (N/A) as a surgical intervention .  The patient's history has been reviewed, patient examined, no change in status, stable for surgery.  I have reviewed the patient's chart and labs.  Questions were answered to the patient's satisfaction.     Eldred Manges

## 2018-05-08 NOTE — Progress Notes (Signed)
Orthopedic Tech Progress Note Patient Details:  Jason HomansBrooks J Plyler Jr. 06-18-1940 [id number]  Patient ID: Jason HomansBrooks J Bucio Jr., male   DOB: 06-18-1940, 78 y.o.   MRN: [id number] Pt has soft collar  Trinna PostMartinez, Jazlin Tapscott J 05/08/2018, 8:57 PM

## 2018-05-08 NOTE — Anesthesia Postprocedure Evaluation (Signed)
Anesthesia Post Note  Patient: Deane Hathorn.  Procedure(s) Performed: C4-C6 POSTERIOR CERVICAL FUSION, WIRING, ILIAC BONE MARROW ASPIRATE (N/A Spine Cervical)     Patient location during evaluation: PACU Anesthesia Type: General Level of consciousness: awake and alert Pain management: pain level controlled Vital Signs Assessment: post-procedure vital signs reviewed and stable Respiratory status: spontaneous breathing, nonlabored ventilation and respiratory function stable Cardiovascular status: blood pressure returned to baseline and stable Postop Assessment: no apparent nausea or vomiting Anesthetic complications: no    Last Vitals:  Vitals:   05/08/18 1920 05/08/18 1940  BP: (!) 157/84 (!) 144/81  Pulse: 93 87  Resp:  18  Temp:  36.4 C  SpO2: 97% 98%    Last Pain:  Vitals:   05/08/18 1940  TempSrc: Oral  PainSc:                  Thomasina Housley,W. EDMOND

## 2018-05-08 NOTE — Op Note (Signed)
Preop diagnosis: C4-C6 anterior cervical fusion with persistent instability and hypermobility, pseudoarthrosis.  Postop diagnosis: Same  Procedure :C4-C6 posterior cervical fusion with triple strand stainless steel wire and spine cable.  (Cables placed underneath C7 due to congenital C6-7 fusion with spinous process fused together at C6 and C7.)  Vitoss 10 cc strip and posterior iliac crest bone marrow aspirate.  Surgeon: Annell Greening, MD  Assistant: Zonia Kief, PA-C medically necessary and present for instrumentation, bone preparation and the critical portion of the surgical case.  EBL: See anesthetic record  Drains: None  Brief history 78 year old male had severe myelopathy with severe spinal stenosis wide-based gait following problems and underwent C4-C6 fusion with two-level cervical fusion.  He had an old history of injury to his neck when he was young and had some calcification of his posterior interspinous ligament.  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  Due to his hypermobility and nonunion with his previous cord myelopathic changes he is brought to the operating room for posterior fusion and stabilization to protect the cord from further damage.  Procedure: After induction general anesthesia orotracheal ovation patient was placed prone on chest rolls with a horseshoe head holder careful padded  Careful positioning arms tucked at the side.  Neck was squared with 1010 drapes after clipping some hair at the base of the skull near the occiput.  Iliac crest on the right side was then included for exposure and triangular 1010 drapes were placed prior to prepping with DuraPrep.  Both areas were squared with towels Betadine Steri-Drape pieces applied after sterile skin marker in the neck at the midline.  Sterile  male standard the head and thyroid sheets and drapes were applied.  Timeout procedure was completed vancomycin was given prophylactically.  Midline incision was made from C4-C7 subperiosteal dissection.  Spinous process at C6 and C7 were fused together in 1 extremely large spinous process with no gaps between the spinous process.  The lamina was partially fused together on each side.  There was mobility at C5-6 and also at C4.  A Coker clamp was placed just above C4 and also at the inferior aspect of C7 confirmed with crosstable lateral x-ray of the appropriate levels.  The posterior lamina was exposed.  Initially triple stand wire was placed around the inferior aspect of C7 hooked around the top of C4 were stopped a small notch was made at the base of the spinous process and wire was twisted tightened down and secured.  There was still mobility at C5 and a 1 mm spine cable was then passed between C5 and C7 for additional stability.  Grasping the fused spinous process at C6-C7 the head could be partially lifted up with no motion once cable been tightened down cramped and cut as well as the wire Avva and twisted down to cable cut and then bent over.  Bone was then prepared with 4 mm Berger good bone bleeding surface after the iliac crest on the right had been poked with the trocar advanced and 15 to 20 cc of blood had been obtained from the iliac crest moving the needle around.  It was allowed to sit on the V-type strip for 10 minutes and then cut in half and after the bone was decorticated on the lamina and spinous process half of each sponge was packed on  each side.  Some vancomycin powder was sprinkled in prevent postop infection.  Serum was brought in for spot pictures.  Closure of the deep fascia with zero #1 Vicryl 2-0 Vicryl subtenons tissue skin staple closure postop dressing and transfer the care of room in stable condition.  Instrument count needle count was correct.

## 2018-05-08 NOTE — H&P (Signed)
Jason Friedman. is an 78 y.o. male.   Chief Complaint: neck pain and radiculopathy HPI: patient with hx of C4-C6 pseudoarthrosis and above complaint presents for surgical intervention.  Progressively worsening symptoms.  Failed conservative treatment  Past Medical History:  Diagnosis Date  . Anemia   . Arthritis   . Cervical spinal stenosis   . Cervical spine fracture (HCC) 2  . Cervical vertebral fracture (HCC) 25 yrs ago   C 5   . 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 COPD (chronic obstructive pulmonary disease) ([facility])    mild copd per lov note dr Meredeth Ide 04-16-11 on chart  . Difficult intubation    difficult d/t anterior larynx, successful intubation using Glidescope 04/07/17  . Fracture of cervical vertebrae, multiple (HCC)  in high school   2 cervical vertebrae fx, 3 dislocated cervical vertebrae  . Heart abnormality    heart anatomicall rotated, causes abnormal ekg  . Hypertension    hx on, none current  . Hypogonadism in male   . Hypothyroidism   . Impaired memory    x 2 yrs ago after neck injury  . Insomnia   . PONV (postoperative nausea and vomiting)    likes scopolamine patch, vertigo ,pt. reports that anesthesia "has had problem finding my airway"  . Prostate irregularity    nonfuctional prostate, on testerone gel bid  . Sigmoid diverticulitis   . Sleep apnea    does not use due to weight loss, does not need now, not able to use CPAP, last sleep study- 2018    Past Surgical History:  Procedure Laterality Date  . ANTERIOR CERVICAL DECOMP/DISCECTOMY FUSION N/A 03/15/2018   Procedure: CERVICAL FOUR-FIVE, CERVICAL FIVE-SIX ANTERIOR CERVICAL DECOMPRESSION/DISCECTOMY FUSION ALLOGRAFT AND PLATE;  Surgeon: Eldred Manges, MD;  [facility];  Service: Orthopedics;  Laterality: N/A;  CERVICAL FOUR-FIVE, CERVICAL FIVE-SIX ANTERIOR CERVICAL DECOMPRESSION/DISCECTOMY FUSION ALLOGRAFT AND PLATE  . piliodional cyst  yrs ago    done x 3  . right shoulder arthroscopy  Mar 02, 2011  . TONSILLECTOMY    . TOTAL HIP ARTHROPLASTY  08/23/2011   Procedure: TOTAL HIP ARTHROPLASTY;  Surgeon: Loanne Drilling, MD;  [facility];  Service: Orthopedics;  Laterality: Right;  . TOTAL SHOULDER ARTHROPLASTY Left 04/07/2017   Procedure: LEFT TOTAL SHOULDER ARTHROPLASTY;  Surgeon: Francena Hanly, MD;  [facility];  Service: Orthopedics;  Laterality: Left;    Family History  Problem Relation Age of Onset  . Breast cancer Mother   . Prostate cancer Father   . Diabetes Sister   . Breast cancer Sister   . Diabetes Sister   . Colon cancer Sister    Social History:  reports that he has never smoked. He has never used smokeless tobacco. He reports current alcohol use of about 2.0 - 3.0 standard drinks of alcohol per week. He reports that he does not use drugs.  Allergies:  Allergies  Allergen Reactions  . Duloxetine Other (See Comments)    Altered personality - didn't eat, pain all over  . Cefuroxime Axetil [Ceftin] Other (See Comments)    Joint pain  . Gabapentin Other (See Comments)    Visual disturbance, hair loss  . Shellfish Allergy Swelling    SWELLING REACTION UNSPECIFIED   . Sulfa Antibiotics Other (See Comments)    UNSPECIFIED REACTION   . Latex Other (See Comments)    Skin irritation     Medications Prior to  Admission  Medication Sig Dispense Refill  . ANDROGEL PUMP 20.25 MG/ACT (1.62%) GEL Apply 3 Pump topically 2 (two) times daily.   0  . Brinzolamide-Brimonidine (SIMBRINZA) 1-0.2 % SUSP Place 1 drop into the left eye 3 (three) times daily.    . cromolyn (OPTICROM) 4 % ophthalmic solution Place 1 drop into both eyes 3 (three) times daily.     Marland Kitchen doxycycline (VIBRAMYCIN) 100 MG capsule Take 100 mg by mouth 2 (two) times daily as needed (for tick bites). Take for 7 days    . fluticasone (FLONASE) 50 MCG/ACT nasal spray Place 2 sprays into both nostrils daily.    Marland Kitchen HYDROcodone-acetaminophen (NORCO/VICODIN)  5-325 MG tablet Take 1 tablet by mouth 4 (four) times daily as needed for moderate pain.     Marland Kitchen levothyroxine (SYNTHROID, LEVOTHROID) 137 MCG tablet Take 137 mcg by mouth daily before breakfast.   3  . losartan (COZAAR) 25 MG tablet Take 25 mg by mouth daily.     . meclizine (ANTIVERT) 25 MG tablet Take 25 mg by mouth 2 (two) times daily as needed for dizziness.    . methocarbamol (ROBAXIN) 500 MG tablet Take 1 tablet (500 mg total) by mouth 2 (two) times daily as needed for muscle spasms. 20 tablet 0  . naproxen sodium (ANAPROX) 550 MG tablet Take 550 mg by mouth 2 (two) times daily with a meal.    . predniSONE (DELTASONE) 5 MG tablet Take 5 mg by mouth 3 (three) times daily.     . sildenafil (REVATIO) 20 MG tablet Take 60 mg by mouth daily as needed (ED).    Marland Kitchen tiZANidine (ZANAFLEX) 4 MG tablet Take 4 mg by mouth at bedtime.      No results found for this or any previous visit (from the past 48 hour(s)). No results found.  Review of Systems  Constitutional: Negative.   HENT: Negative.   Respiratory: Negative.   Cardiovascular: Negative.   Musculoskeletal: Positive for neck pain.  Skin: Negative.   Neurological: Positive for tingling.  Psychiatric/Behavioral: Negative.     There were no vitals taken for this visit. Physical Exam  Constitutional: He is oriented to person, place, and time. He appears well-developed.  HENT:  Head: Normocephalic and atraumatic.  Eyes: Pupils are equal, round, and reactive to light. EOM are normal.  Cardiovascular: Normal heart sounds.  Respiratory: Effort normal. No respiratory distress. He has no wheezes.  GI: Soft. Bowel sounds are normal. He exhibits no distension.  Musculoskeletal:        General: Tenderness present.  Neurological: He is alert and oriented to person, place, and time.  Skin: Skin is warm and dry.  Psychiatric: He has a normal mood and affect.     Assessment/Plan C4-C6 pseudoarthrosis  Will proceed with C4-C6 POSTERIOR  CERVICAL FUSION, WIRING, ILIAC BONE MARROW ASPIRATE  As scheduled  Zonia Kief, PA-C 05/08/2018, 12:57 PM

## 2018-05-08 NOTE — Anesthesia Preprocedure Evaluation (Signed)
Anesthesia Evaluation  Patient identified by MRN, date of birth, ID band Patient awake    Reviewed: Allergy & Precautions, NPO status , Patient's Chart, lab work & pertinent test results  History of Anesthesia Complications (+) PONV, DIFFICULT AIRWAY and history of anesthetic complications  Airway Mallampati: II  TM Distance: >3 FB Neck ROM: Limited    Dental no notable dental hx. (+) Teeth Intact, Dental Advisory Given   Pulmonary sleep apnea (does not use CPAP) , COPD,    Pulmonary exam normal breath sounds clear to auscultation       Cardiovascular hypertension, negative cardio ROS Normal cardiovascular exam Rhythm:Regular Rate:Normal     Neuro/Psych negative neurological ROS  negative psych ROS   GI/Hepatic negative GI ROS, Neg liver ROS,   Endo/Other  Hypothyroidism   Renal/GU negative Renal ROS  negative genitourinary   Musculoskeletal  (+) Arthritis , Osteoarthritis,    Abdominal   Peds  Hematology negative hematology ROS (+)   Anesthesia Other Findings Echo 05/19/16: - Normal LV size with EF 55-60%. Normal RV size and systolicfunction. No significant valvular abnormalities  Most recent airway note: - 03/15/18: IV induction, mask ventilation without difficulty. Grade I. Glidescope and 4. Ridgid stylet and Video-laryngoscopy. Oral-tracheal intubation. 1 attempt. 8.0 mm ETT. Difficulty anticipated due to history and neck instability. Comments: Elective glidescope per surgeon due to cervical myelopathy.    Reproductive/Obstetrics                             Anesthesia Physical Anesthesia Plan  ASA: III  Anesthesia Plan: General   Post-op Pain Management:    Induction: Intravenous  PONV Risk Score and Plan: 3 and Midazolam, Dexamethasone, Ondansetron and Treatment may vary due to age or medical condition  Airway Management Planned: Oral ETT  Additional Equipment:    Intra-op Plan:   Post-operative Plan: Extubation in OR  Informed Consent: I have reviewed the patients History and Physical, chart, labs and discussed the procedure including the risks, benefits and alternatives for the proposed anesthesia with the patient or authorized representative who has indicated his/her understanding and acceptance.   Dental advisory given  Plan Discussed with: CRNA  Anesthesia Plan Comments:         Anesthesia Quick Evaluation

## 2018-05-08 NOTE — Transfer of Care (Signed)
Immediate Anesthesia Transfer of Care Note  Patient: Jason Friedman.  Procedure(s) Performed: C4-C6 POSTERIOR CERVICAL FUSION, WIRING, ILIAC BONE MARROW ASPIRATE (N/A Spine Cervical)  Patient Location: PACU  Anesthesia Type:General  Level of Consciousness: awake and patient cooperative  Airway & Oxygen Therapy: Patient Spontanous Breathing and Patient connected to face mask oxygen  Post-op Assessment: Report given to RN, Post -op Vital signs reviewed and stable and Patient moving all extremities X 4  Post vital signs: Reviewed and stable  Last Vitals:  Vitals Value Taken Time  BP 189/81 05/08/2018  6:50 PM  Temp    Pulse 90 05/08/2018  6:53 PM  Resp 14 05/08/2018  6:53 PM  SpO2 100 % 05/08/2018  6:53 PM  Vitals shown include unvalidated device data.  Last Pain:  Vitals:   05/08/18 1851  TempSrc:   PainSc: (P) 0-No pain         Complications: No apparent anesthesia complications

## 2018-05-09 DIAGNOSIS — M960 Pseudarthrosis after fusion or arthrodesis: Secondary | ICD-10-CM | POA: Diagnosis not present

## 2018-05-09 MED ORDER — OXYCODONE HCL 5 MG PO TABS: 5.0000 mg | ORAL_TABLET | ORAL | 0 refills | Status: DC | PRN

## 2018-05-09 NOTE — Progress Notes (Signed)
   Subjective: 1 Day Post-Op Procedure(s) (LRB): C4-C6 POSTERIOR CERVICAL FUSION, WIRING, ILIAC BONE MARROW ASPIRATE (N/A) Patient reports pain as mild.    Objective: Vital signs in last 24 hours: Temp:  [97.5 F (36.4 C)-98.4 F (36.9 C)] 98.4 F (36.9 C) (01/14 0353) Pulse Rate:  [76-99] 76 (01/14 0353) Resp:  [14-20] 18 (01/14 0353) BP: (106-189)/(69-98) 106/70 (01/14 0353) SpO2:  [94 %-99 %] 97 % (01/14 0353) Weight:  [81.3 kg] 81.3 kg (01/13 1308)  Intake/Output from previous day: 01/13 0701 - 01/14 0700 In: 1503 [I.V.:1503] Out: 375 [Urine:375] Intake/Output this shift: No intake/output data recorded.  No results for input(s): HGB in the last 72 hours. No results for input(s): WBC, RBC, HCT, PLT in the last 72 hours. No results for input(s): NA, K, CL, CO2, BUN, CREATININE, GLUCOSE, CALCIUM in the last 72 hours. No results for input(s): LABPT, INR in the last 72 hours.  Neurologically intact Dg Cervical Spine 2-3 Views  Result Date: 05/08/2018 CLINICAL DATA:  C4-C6 posterior cervical fusion. EXAM: DG C-ARM 61-120 MIN; CERVICAL SPINE - 2-3 VIEW COMPARISON:  04/28/2018 FINDINGS: Intraoperative fluoroscopic frontal and lateral views of the cervical spine demonstrate placement of cerclage wires through the posterior elements of C4 through C7. There is a pre-existing anterior fusion at the same levels. Intubated patient. IMPRESSION: Cerclage wire placement around the spinous processes of C4 through C7. Electronically Signed   By: Ted Mcalpine M.D.   On: 05/08/2018 19:56   Dg C-arm 1-60 Min  Result Date: 05/08/2018 CLINICAL DATA:  C4-C6 posterior cervical fusion. EXAM: DG C-ARM 61-120 MIN; CERVICAL SPINE - 2-3 VIEW COMPARISON:  04/28/2018 FINDINGS: Intraoperative fluoroscopic frontal and lateral views of the cervical spine demonstrate placement of cerclage wires through the posterior elements of C4 through C7. There is a pre-existing anterior fusion at the same levels.  Intubated patient. IMPRESSION: Cerclage wire placement around the spinous processes of C4 through C7. Electronically Signed   By: Ted Mcalpine M.D.   On: 05/08/2018 19:56    Assessment/Plan: 1 Day Post-Op Procedure(s) (LRB): C4-C6 POSTERIOR CERVICAL FUSION, WIRING, ILIAC BONE MARROW ASPIRATE (N/A) Plan: discharge home office one week.   Eldred Manges 05/09/2018, 7:39 AM

## 2018-05-09 NOTE — Progress Notes (Signed)
Pt doing well. Pt and wife given D/C instructions with verbal understanding. Rx's were sent to Pt's pharmacy by MD. Pt's incision is clean and dry, dressing was changed per MD order. Pt's IV was removed prior to D/C. Pt D/C'd home via walking @ 0930 per MD order. Pt is stable @ D/C and has no other needs at this time. Rema FendtAshley Marisella Puccio, RN

## 2018-05-09 NOTE — Plan of Care (Signed)
  Problem: Education: Goal: Ability to verbalize activity precautions or restrictions will improve Outcome: Progressing Goal: Knowledge of the prescribed therapeutic regimen will improve Outcome: Progressing Goal: Understanding of discharge needs will improve Outcome: Progressing   Problem: Pain Management: Goal: Pain level will decrease Outcome: Progressing   

## 2018-05-09 NOTE — Evaluation (Signed)
Occupational Therapy Evaluation Patient Details Name: Jason Friedman. MRN: [id number] DOB: 21-Apr-1941 Today's Date: 05/09/2018    History of Present Illness 78 yo male s/p PCF C4-C6 at MOD I level with cues for cervical precautions. Pt with poor recall of precautions during functional task. Wife reports she will (A) 24/7 to help decr risk.  PMH:  Arthritis, Cervical spinal stenosis, Cervical spine fracture, COPD (chronic obstructive pulmonary disease) , Heart abnormality, Hypertension, Hypogonadism in male, Hypothyroidism, Impaired memory, Insomnia,  Sigmoid diverticulitis, and Sleep apnea.    Clinical Impression   Patient evaluated by Occupational Therapy with no further acute OT needs identified. All education has been completed and the patient has no further questions. See below for any follow-up Occupational Therapy or equipment needs. OT to sign off. Thank you for referral.      Follow Up Recommendations  No OT follow up    Equipment Recommendations  None recommended by OT    Recommendations for Other Services       Precautions / Restrictions Precautions Precautions: Cervical Precaution Comments: handout provided and reviewd in detial for adls and avoid lifting anything more than adl items required and inability to drive or use a gun at this time Required Braces or Orthoses: Cervical Brace Cervical Brace: Soft collar;At all times      Mobility Bed Mobility               General bed mobility comments: up in room on arrival  Transfers Overall transfer level: Independent               General transfer comment: educated on minimal use of BIL UE due to cervical precautions    Balance                                           ADL either performed or assessed with clinical judgement   ADL Overall ADL's : Modified independent                                   Tub/Shower Transfer Details (indicate cue type and reason):  noticeable balance loss with visual occlusion. pt educated to sponge bath until able to maintain balance or have wife (A). pt states "yeah i have to hold on"    General ADL Comments: pt dressed on arrival and walking about room without soft collar. when asked what are the rules for collar pt verbalized "wear it at all times" pt states "the nurse is going to change bandage so i left it off"     Vision Baseline Vision/History: Wears glasses       Perception     Praxis      Pertinent Vitals/Pain Pain Assessment: No/denies pain     Hand Dominance Right   Extremity/Trunk Assessment Upper Extremity Assessment Upper Extremity Assessment: Generalized weakness(reports numbness present)   Lower Extremity Assessment Lower Extremity Assessment: LLE deficits/detail LLE Deficits / Details: reports weakness at the knee and occassional bunkle   Cervical / Trunk Assessment Cervical / Trunk Assessment: Other exceptions Cervical / Trunk Exceptions: s/p surg with previous surg   Communication Communication Communication: No difficulties   Cognition Arousal/Alertness: Awake/alert Behavior During Therapy: Anxious;Impulsive(pt pacing in the room on arrival) Overall Cognitive Status: Impaired/Different from baseline Area of Impairment: Awareness  Awareness: Anticipatory   General Comments: pt required extensive education on precautions with preferred hobbies. pt eager to leave and needed redirection to sitting to complete education.    General Comments  dressing wet and pending change from RN. RN arriving at end of session to apply new dressing    Exercises     Shoulder Instructions      Home Living Family/patient expects to be discharged to:: Private residence Living Arrangements: Spouse/significant other Available Help at Discharge: Family;Available 24 hours/day Type of Home: House Home Access: Stairs to enter [employer] of Steps:  15 Entrance Stairs-Rails: Left Home Layout: Able to live on main level with bedroom/bathroom     Bathroom Shower/Tub: Producer, television/film/video: Standard     Home Equipment: Environmental consultant - 2 wheels;Gilmer Mor - single point   Additional Comments: walks the family land of [place] acres daily, pt normally carries gun with him while walking. pt expressed concern to set fox traps and shooting animals on the property. pt has a workshop for tools and hand saws. pt advised to get MD approval before operating anything with vibration   Cervical precautions ( handout provided): Educated patient on don doff brace with return demonstration, educated on oral care using cups, washing face with cloth, never to wash directly on incision site, avoid neck rotation flexion and extension, positioning with pillows in chair for bil UE, sleeping positioning, avoiding pushing / pulling with bil UE,. Instructed on wall slides FF 90 degrees and table top slides.  Pt educated on need to notify doctor / RN of swallowing changes or choking..      Prior Functioning/Environment Level of Independence: Independent                 OT Problem List:        OT Treatment/Interventions:      OT Goals(Current goals can be found in the care plan section) Acute Rehab OT Goals Patient Stated Goal: to go home and carry my little gun   OT Frequency:     Barriers to D/C:            Co-evaluation              AM-PAC OT "6 Clicks" Daily Activity     Outcome Measure Help from another person eating meals?: None Help from another person taking care of personal grooming?: None Help from another person toileting, which includes using toliet, bedpan, or urinal?: None Help from another person bathing (including washing, rinsing, drying)?: None Help from another person to put on and taking off regular upper body clothing?: None Help from another person to put on and taking off regular lower body clothing?: None 6 Click Score:  24   End of Session Equipment Utilized During Treatment: Cervical collar Nurse Communication: Mobility status;Precautions  Activity Tolerance: Patient tolerated treatment well Patient left: in bed;with call bell/phone within reach;with family/visitor present(sitting EOB)  OT Visit Diagnosis: Muscle weakness (generalized) (M62.81)                Time: 8295-6213 OT Time Calculation (min): 37 min Charges:  OT General Charges $OT Visit: 1 Visit OT Evaluation $OT Eval Moderate Complexity: 1 Mod OT Treatments $Self Care/Home Management : 8-22 mins   Mateo Flow, OTR/L  Acute Rehabilitation Services Pager: [phone] Office: [phone] .   Mateo Flow 05/09/2018, 3:45 PM

## 2018-05-09 NOTE — Discharge Instructions (Signed)
Ok to remove collar to shower. Reapply after shower. See Dr. Ophelia Charter in one week

## 2018-05-10 ENCOUNTER — Telehealth (INDEPENDENT_AMBULATORY_CARE_PROVIDER_SITE_OTHER): Payer: Self-pay | Admitting: Orthopaedic Surgery

## 2018-05-10 ENCOUNTER — Encounter (HOSPITAL_COMMUNITY): Payer: Self-pay | Admitting: Orthopaedic Surgery

## 2018-05-10 NOTE — Telephone Encounter (Signed)
Error

## 2018-05-10 NOTE — Telephone Encounter (Signed)
Noted! Thank you

## 2018-05-10 NOTE — Telephone Encounter (Signed)
Patient wife Nettie Elm called to state that patient had surgery on 05/08/2018 and having oozing and some bleeding. She wanted to know if this was normal.  Please call spouse as she has called and I thought this was triage and was told to send an urgent message to you.  [phone]

## 2018-05-10 NOTE — Telephone Encounter (Signed)
I called . Discussed. Bloody drainage.  Reinforce dressing . ROV as scheduled already. FYI

## 2018-05-10 NOTE — Telephone Encounter (Signed)
See message below °

## 2018-05-10 NOTE — Telephone Encounter (Signed)
Toniann FailWendy calling you regarding message. Please get Dr. Ophelia CharterYates to advise.  Thanks.

## 2018-05-11 NOTE — Discharge Summary (Signed)
Patient ID: Jason Friedman. MRN: [id number] DOB/AGE: 12/25/1940 78 y.o.  Admit date: 05/08/2018 Discharge date: 05/11/2018  Admission Diagnoses:  Active Problems:   Cervical pseudoarthrosis [facility])   Discharge Diagnoses:  Active Problems:   Cervical pseudoarthrosis (HCC)  status post Procedure(s): C4-C6 POSTERIOR CERVICAL FUSION, WIRING, ILIAC BONE MARROW ASPIRATE  Past Medical History:  Diagnosis Date  . Anemia   . Arthritis   . Cervical spinal stenosis   . Cervical spine fracture (HCC) 2  . Cervical vertebral fracture (HCC) 25 yrs ago   C 5   . 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 COPD (chronic obstructive pulmonary disease) (HCC)    mild copd per lov note dr Meredeth Ide 04-16-11 on chart  . Difficult intubation    difficult d/t anterior larynx, successful intubation using Glidescope 04/07/17  . Fracture of cervical vertebrae, multiple (HCC)  in high school   2 cervical vertebrae fx, 3 dislocated cervical vertebrae  . Heart abnormality    heart anatomicall rotated, causes abnormal ekg  . Hypertension    hx on, none current  . Hypogonadism in male   . Hypothyroidism   . Impaired memory    x 2 yrs ago after neck injury  . Insomnia   . PONV (postoperative nausea and vomiting)    likes scopolamine patch, vertigo ,pt. reports that anesthesia "has had problem finding my airway"  . Prostate irregularity    nonfuctional prostate, on testerone gel bid  . Sigmoid diverticulitis   . Sleep apnea    does not use due to weight loss, does not need now, not able to use CPAP, last sleep study- 2018    Surgeries: Procedure(s): C4-C6 POSTERIOR CERVICAL FUSION, WIRING, ILIAC BONE MARROW ASPIRATE on 05/08/2018   Consultants:   Discharged Condition: Improved  Hospital Course: Jason Friedman. is an 78 y.o. male who was admitted 05/08/2018 for operative treatment of cervical pseudoarthrosis. Patient failed conservative  treatments (please see the history and physical for the specifics) and had severe unremitting pain that affects sleep, daily activities and work/hobbies. After pre-op clearance, the patient was taken to the operating room on 05/08/2018 and underwent  Procedure(s): C4-C6 POSTERIOR CERVICAL FUSION, WIRING, ILIAC BONE MARROW ASPIRATE.    Patient was given perioperative antibiotics:  Anti-infectives (From admission, onward)   Start     Dose/Rate Route Frequency Ordered Stop   05/09/18 0600  vancomycin (VANCOCIN) IVPB 1000 mg/200 mL premix     1,000 mg 200 mL/hr over 60 Minutes Intravenous On call to O.R. 05/08/18 1250 05/08/18 1416   05/09/18 0300  vancomycin (VANCOCIN) IVPB 1000 mg/200 mL premix     1,000 mg 200 mL/hr over 60 Minutes Intravenous  Once 05/08/18 2142 05/09/18 0431   05/08/18 1628  vancomycin (VANCOCIN) powder  Status:  Discontinued       As needed 05/08/18 1629 05/08/18 1845       Patient was given sequential compression devices and early ambulation to prevent DVT.   Patient benefited maximally from hospital stay and there were no complications. At the time of discharge, the patient was urinating/moving their bowels without difficulty, tolerating a regular diet, pain is controlled with oral pain medications and they have been cleared by PT/OT.   Recent vital signs: No data found.   Recent laboratory studies: No results for input(s): WBC, HGB, HCT, PLT, NA, K, CL, CO2, BUN, CREATININE, GLUCOSE, INR, CALCIUM in the last 72 hours.  Invalid input(s): PT, 2   Discharge Medications:   Allergies as of 05/09/2018      Reactions   Duloxetine Other (See Comments)   Altered personality - didn't eat, pain all over   Cefuroxime Axetil [ceftin] Other (See Comments)   Joint pain   Gabapentin Other (See Comments)   Visual disturbance, hair loss   Shellfish Allergy Swelling   SWELLING REACTION UNSPECIFIED    Sulfa Antibiotics Other (See Comments)   UNSPECIFIED REACTION    Latex  Other (See Comments)   Skin irritation       Medication List    TAKE these medications   ANDROGEL PUMP 20.25 MG/ACT (1.62%) Gel Generic drug:  Testosterone Apply 3 Pump topically 2 (two) times daily.   cromolyn 4 % ophthalmic solution Commonly known as:  OPTICROM Place 1 drop into both eyes 3 (three) times daily.   doxycycline 100 MG capsule Commonly known as:  VIBRAMYCIN Take 100 mg by mouth 2 (two) times daily as needed (for tick bites). Take for 7 days   fluticasone 50 MCG/ACT nasal spray Commonly known as:  FLONASE Place 2 sprays into both nostrils daily.   HYDROcodone-acetaminophen 5-325 MG tablet Commonly known as:  NORCO/VICODIN Take 1 tablet by mouth 4 (four) times daily as needed for moderate pain.   levothyroxine 137 MCG tablet Commonly known as:  SYNTHROID, LEVOTHROID Take 137 mcg by mouth daily before breakfast.   losartan 25 MG tablet Commonly known as:  COZAAR Take 25 mg by mouth daily.   meclizine 25 MG tablet Commonly known as:  ANTIVERT Take 25 mg by mouth 2 (two) times daily as needed for dizziness.   methocarbamol 500 MG tablet Commonly known as:  ROBAXIN Take 1 tablet (500 mg total) by mouth 2 (two) times daily as needed for muscle spasms.   naproxen sodium 550 MG tablet Commonly known as:  ANAPROX Take 550 mg by mouth 2 (two) times daily with a meal.   oxyCODONE 5 MG immediate release tablet Commonly known as:  Oxy IR/ROXICODONE Take 1 tablet (5 mg total) by mouth every 4 (four) hours as needed for severe pain ((score 7 to 10)). Post op pain   predniSONE 5 MG tablet Commonly known as:  DELTASONE Take 5 mg by mouth 3 (three) times daily.   sildenafil 20 MG tablet Commonly known as:  REVATIO Take 60 mg by mouth daily as needed (ED).   SIMBRINZA 1-0.2 % Susp Generic drug:  Brinzolamide-Brimonidine Place 1 drop into the left eye 3 (three) times daily.   tiZANidine 4 MG tablet Commonly known as:  ZANAFLEX Take 4 mg by mouth at  bedtime.       Diagnostic Studies: Dg Cervical Spine 2-3 Views  Result Date: 05/08/2018 CLINICAL DATA:  C4-C6 posterior cervical fusion. EXAM: DG C-ARM 61-120 MIN; CERVICAL SPINE - 2-3 VIEW COMPARISON:  04/28/2018 FINDINGS: Intraoperative fluoroscopic frontal and lateral views of the cervical spine demonstrate placement of cerclage wires through the posterior elements of C4 through C7. There is a pre-existing anterior fusion at the same levels. Intubated patient. IMPRESSION: Cerclage wire placement around the spinous processes of C4 through C7. Electronically Signed   By: Ted Mcalpineobrinka  Dimitrova M.D.   On: 05/08/2018 19:56   Dg C-arm 1-60 Min  Result Date: 05/08/2018 CLINICAL DATA:  C4-C6 posterior cervical fusion. EXAM: DG C-ARM 61-120 MIN; CERVICAL SPINE - 2-3 VIEW COMPARISON:  04/28/2018 FINDINGS: Intraoperative fluoroscopic frontal and lateral views of the cervical spine demonstrate placement of cerclage wires through  the posterior elements of C4 through C7. There is a pre-existing anterior fusion at the same levels. Intubated patient. IMPRESSION: Cerclage wire placement around the spinous processes of C4 through C7. Electronically Signed   By: Ted Mcalpine M.D.   On: 05/08/2018 19:56   Xr Cervical Spine 2 Or 3 Views  Result Date: 04/28/2018 AP cervical spine x-ray and lateral flexion-extension x-rays are obtained and reviewed.  This shows significant motion with increase in 6 mm between C5 and C6 spinous process and 3 mm motion at C4-5.  Old calcification from interspinous ligament injury noted. Impression: Gross instability post anterior cervical fusion noted on flexion-extension x-rays     Follow-up Information    Eldred Manges, MD Follow up in 1 week(s).   Specialty:  Orthopedic Surgery Contact information: [address] [place] [address] [phone]           Discharge Plan:  discharge to home  Disposition:     Signed: Zonia Kief for Annell Greening  MD 05/11/2018, 9:33 AM

## 2018-05-12 ENCOUNTER — Encounter (HOSPITAL_COMMUNITY): Payer: Self-pay | Admitting: Orthopaedic Surgery

## 2018-05-12 ENCOUNTER — Other Ambulatory Visit (INDEPENDENT_AMBULATORY_CARE_PROVIDER_SITE_OTHER): Payer: Self-pay | Admitting: Surgery

## 2018-05-12 NOTE — Telephone Encounter (Signed)
Ok for refill? 

## 2018-05-16 ENCOUNTER — Encounter (INDEPENDENT_AMBULATORY_CARE_PROVIDER_SITE_OTHER): Payer: Self-pay | Admitting: Orthopaedic Surgery

## 2018-05-16 ENCOUNTER — Ambulatory Visit (INDEPENDENT_AMBULATORY_CARE_PROVIDER_SITE_OTHER): Payer: Medicare Other | Admitting: Orthopaedic Surgery

## 2018-05-16 ENCOUNTER — Ambulatory Visit (INDEPENDENT_AMBULATORY_CARE_PROVIDER_SITE_OTHER): Payer: Medicare Other

## 2018-05-16 VITALS — BP 162/93 | HR 73 | Ht 69.0 in | Wt 179.0 lb

## 2018-05-16 DIAGNOSIS — M4712 Other spondylosis with myelopathy, cervical region: Secondary | ICD-10-CM

## 2018-05-16 MED ORDER — OXYCODONE-ACETAMINOPHEN 5-325 MG PO TABS: 1.0000 | ORAL_TABLET | Freq: Four times a day (QID) | ORAL | 0 refills | Status: DC | PRN

## 2018-05-16 NOTE — Progress Notes (Signed)
Post-Op Visit Note   Patient: Jason Friedman.           Date of Birth: 01/09/1941           MRN: [id number] Visit Date: 05/16/2018 PCP: Marden Noble, MD   Assessment & Plan: Cherlynn Polo are harvested Steri-Strips applied no drainage from his incision.  He has a little bit of ecchymosis around his neck as expected.  He can continue the collar.  Return 5 weeks repeat x-rays C-spine on return.  Chief Complaint:  Chief Complaint  Patient presents with  . Neck - Routine Post Op    05/08/2018 C4-C6 posterior cervical fusion, wiring, iliac bone marrow aspirate   Visit Diagnoses:  1. Other spondylosis with myelopathy, cervical region     Plan: Recheck 5 weeks.  AP lateral x-ray on return cervical spine.  We reviewed with him x-ray results.  Continue collar he can use his old collar when he takes a shower.  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  Follow-Up Instructions: Return in about 6 years (around 05/16/2024).   Orders:  Orders Placed This Encounter  Procedures  . XR Cervical Spine 2 or 3 views   No orders of the defined types were placed in this encounter.   Imaging: No results found.  PMFS History: Patient Active Problem List   Diagnosis Date Noted  . Cervical pseudoarthrosis (HCC) 05/08/2018  . Pseudoarthrosis of cervical spine (HCC) 04/28/2018  . Cervical stenosis of spine 03/15/2018  . Other spondylosis with myelopathy, cervical region 03/09/2018  . 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 OA (osteoarthritis) of hip 08/23/2011   Past Medical History:  Diagnosis Date  . Anemia   . Arthritis   . Cervical spinal stenosis   . Cervical spine fracture (HCC) 2  . Cervical vertebral fracture (HCC) 25 yrs ago   C 5   . 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 COPD (chronic obstructive pulmonary disease) (HCC)    mild copd per lov note dr Meredeth Ide 04-16-11 on chart  . Difficult intubation    difficult d/t anterior larynx, successful intubation using Glidescope 04/07/17  . Fracture of cervical vertebrae, multiple (HCC)  in high school   2 cervical vertebrae fx, 3 dislocated cervical vertebrae  . Heart abnormality    heart anatomicall rotated, causes abnormal ekg  . Hypertension    hx on, none current  . Hypogonadism in male   . Hypothyroidism   . Impaired memory    x 2 yrs ago after neck injury  . Insomnia   . PONV (postoperative nausea and vomiting)    likes scopolamine patch, vertigo ,pt. reports that anesthesia "has had problem finding my airway"  . Prostate irregularity    nonfuctional prostate, on testerone gel bid  . Sigmoid diverticulitis   . Sleep apnea    does not use due to weight loss, does not need now, not able to use CPAP, last sleep study- 2018    Family History  Problem Relation Age of Onset  . Breast cancer Mother   . Prostate cancer Father   . Diabetes Sister   . Breast cancer Sister   . Diabetes Sister   . Colon cancer Sister     Past Surgical History:  Procedure Laterality Date  . ANTERIOR  CERVICAL DECOMP/DISCECTOMY FUSION N/A 03/15/2018   Procedure: CERVICAL FOUR-FIVE, CERVICAL FIVE-SIX ANTERIOR CERVICAL DECOMPRESSION/DISCECTOMY FUSION ALLOGRAFT AND PLATE;  Surgeon: Eldred Manges, MD;  Location: [facility] OR;  Service: Orthopedics;  Laterality: N/A;  CERVICAL FOUR-FIVE, CERVICAL FIVE-SIX ANTERIOR CERVICAL DECOMPRESSION/DISCECTOMY FUSION ALLOGRAFT AND PLATE  . piliodional cyst  yrs ago   done x 3  . POSTERIOR CERVICAL FUSION/FORAMINOTOMY N/A 05/08/2018   Procedure: C4-C6 POSTERIOR CERVICAL FUSION, WIRING, ILIAC BONE MARROW ASPIRATE;  Surgeon: Eldred Manges, MD;  Location: [facility] OR;  Service: Orthopedics;  Laterality: N/A;  . right shoulder arthroscopy  Mar 02, 2011  . TONSILLECTOMY    . TOTAL HIP ARTHROPLASTY   08/23/2011   Procedure: TOTAL HIP ARTHROPLASTY;  Surgeon: Loanne Drilling, MD;  Location: [facility] ORS;  Service: Orthopedics;  Laterality: Right;  . TOTAL SHOULDER ARTHROPLASTY Left 04/07/2017   Procedure: LEFT TOTAL SHOULDER ARTHROPLASTY;  Surgeon: Francena Hanly, MD;  Location: [facility] OR;  Service: Orthopedics;  Laterality: Left;   Social History   Occupational History  . Occupation: Retired  Tobacco Use  . Smoking status: Never Smoker  . Smokeless tobacco: Never Used  Substance and Sexual Activity  . Alcohol use: Yes    Alcohol/week: 2.0 - 3.0 standard drinks    Types: 2 - 3 Cans of beer per week    Comment: rare use of whiskey- q 6 months   . Drug use: No  . Sexual activity: Not on file

## 2018-05-24 ENCOUNTER — Other Ambulatory Visit (INDEPENDENT_AMBULATORY_CARE_PROVIDER_SITE_OTHER): Payer: Self-pay | Admitting: Surgery

## 2018-05-24 ENCOUNTER — Telehealth (INDEPENDENT_AMBULATORY_CARE_PROVIDER_SITE_OTHER): Payer: Self-pay | Admitting: Orthopaedic Surgery

## 2018-05-24 MED ORDER — METHOCARBAMOL 500 MG PO TABS: ORAL_TABLET | ORAL | 0 refills | Status: DC

## 2018-05-24 NOTE — Telephone Encounter (Signed)
OK thank you 

## 2018-05-24 NOTE — Telephone Encounter (Signed)
Sent to pharmacy 

## 2018-05-24 NOTE — Telephone Encounter (Signed)
Ok to give him # 30 thanks

## 2018-05-24 NOTE — Telephone Encounter (Signed)
Ok for refill? 

## 2018-05-24 NOTE — Telephone Encounter (Signed)
Please see below message. I have already sent medication refill request to you. Patient requests more than #20.

## 2018-05-24 NOTE — Telephone Encounter (Signed)
Patient called needed a medication refill for methocarbamol he stated he needs more than 20 pills due to running out. Patient is still having pain in left leg as well as right thumb.

## 2018-05-30 ENCOUNTER — Encounter (INDEPENDENT_AMBULATORY_CARE_PROVIDER_SITE_OTHER): Payer: Self-pay | Admitting: Orthopaedic Surgery

## 2018-05-30 ENCOUNTER — Ambulatory Visit (INDEPENDENT_AMBULATORY_CARE_PROVIDER_SITE_OTHER): Payer: Medicare Other

## 2018-05-30 ENCOUNTER — Ambulatory Visit (INDEPENDENT_AMBULATORY_CARE_PROVIDER_SITE_OTHER): Payer: Medicare Other | Admitting: Orthopaedic Surgery

## 2018-05-30 ENCOUNTER — Telehealth (INDEPENDENT_AMBULATORY_CARE_PROVIDER_SITE_OTHER): Payer: Self-pay | Admitting: Orthopaedic Surgery

## 2018-05-30 VITALS — BP 133/80 | HR 79 | Ht 69.0 in | Wt 179.0 lb

## 2018-05-30 DIAGNOSIS — M4712 Other spondylosis with myelopathy, cervical region: Secondary | ICD-10-CM

## 2018-05-30 NOTE — Telephone Encounter (Signed)
Patient's wife left a message this morning stating that the cable has come unhooked from the bone and now the patient has a small knot on his neck and is experiencing sharpe pain when he is trying to open something.  Wants to know if they need to schedule him an appointment.  Her CB#[phone].  Thank you.

## 2018-05-30 NOTE — Progress Notes (Signed)
Post-Op Visit Note   Patient: Jason HomansBrooks J Bovee Jr.           Date of Birth: [birth year]           MRN: [id number] Visit Date: 05/30/2018 PCP: Marden NobleGates, Robert, MD   Assessment & Plan: Patient returns and his wife would notice she could palpate a structure posteriorly that was prominent in the neck which is actually [facility]  Repeat x-rays were done there is no change in the cable that have been around the top of C5 but then popped off.  The triple strand wiring around the top of C4 remains in position securely.  In the soft collar he still in slight kyphosis and he has the Aspen collar at home which his wife retrieved from the house short distance away from the office and we applied this pushing him up into some extension which were closed in the posterior structures.  Patient had an old injury playing football in high school ripped all his posterior interspinous ligaments.  We discussed with him he needs to keep the Aspen on securely and he can swap to the soft collar wrapped in cellophane when he takes a shower.  Return February 25 as planned and will shoot one single lateral x-ray in his brace.  Incision looks good.  Chief Complaint:  Chief Complaint  Patient presents with  . Neck - Pain    05/08/2018 C4-6 Posterior Cervical Fusion, Wiring, Iliac Bone Marrow Aspirate   Visit Diagnoses:  1. Other spondylosis with myelopathy, cervical region     Plan: We will keep Aspen brace on all the time simply showers to use soft collar [place] saran wrap.  Return for every 20 for single lateral x-ray in his Aspen brace on return.  Follow-Up Instructions: No follow-ups on file.   Orders:  Orders Placed This Encounter  Procedures  . XR Cervical Spine 2 or 3 views   No orders of the defined types were placed in this encounter.   Imaging: No results found.  PMFS History: Patient Active Problem List   Diagnosis Date Noted  . Cervical pseudoarthrosis (HCC) 05/08/2018  .  Pseudoarthrosis of cervical spine (HCC) 04/28/2018  . Cervical stenosis of spine 03/15/2018  . Other spondylosis with myelopathy, cervical region 03/09/2018  . 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 OA (osteoarthritis) of hip 08/23/2011   Past Medical History:  Diagnosis Date  . Anemia   . Arthritis   . Cervical spinal stenosis   . Cervical spine fracture (HCC) 2  . Cervical vertebral fracture (HCC) 25 yrs ago   C 5   . 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 COPD (chronic obstructive pulmonary disease) (HCC)    mild copd per lov note dr Meredeth Idefleming 04-16-11 on chart  . Difficult intubation    difficult d/t anterior larynx, successful intubation using Glidescope 04/07/17  . Fracture of cervical vertebrae, multiple (HCC)  in high school   2 cervical vertebrae fx, 3 dislocated cervical vertebrae  . Heart abnormality    heart anatomicall rotated, causes abnormal ekg  . Hypertension    hx on, none current  . Hypogonadism in male   . Hypothyroidism   . Impaired memory    x 2 yrs ago after neck injury  . Insomnia   . PONV (postoperative nausea and vomiting)    likes scopolamine patch, vertigo ,pt. reports that anesthesia "has had problem  finding my airway"  . Prostate irregularity    nonfuctional prostate, on testerone gel bid  . Sigmoid diverticulitis   . Sleep apnea    does not use due to weight loss, does not need now, not able to use CPAP, last sleep study- 2018    Family History  Problem Relation Age of Onset  . Breast cancer Mother   . Prostate cancer Father   . Diabetes Sister   . Breast cancer Sister   . Diabetes Sister   . Colon cancer Sister     Past Surgical History:  Procedure Laterality Date  . ANTERIOR CERVICAL DECOMP/DISCECTOMY FUSION N/A 03/15/2018   Procedure: CERVICAL FOUR-FIVE, CERVICAL FIVE-SIX ANTERIOR CERVICAL DECOMPRESSION/DISCECTOMY FUSION ALLOGRAFT AND PLATE;  Surgeon: Eldred MangesYates, Mylen Mangan C, MD;   Location: [facility] OR;  Service: Orthopedics;  Laterality: N/A;  CERVICAL FOUR-FIVE, CERVICAL FIVE-SIX ANTERIOR CERVICAL DECOMPRESSION/DISCECTOMY FUSION ALLOGRAFT AND PLATE  . piliodional cyst  yrs ago   done x 3  . POSTERIOR CERVICAL FUSION/FORAMINOTOMY N/A 05/08/2018   Procedure: C4-C6 POSTERIOR CERVICAL FUSION, WIRING, ILIAC BONE MARROW ASPIRATE;  Surgeon: Eldred MangesYates, Harbert Fitterer C, MD;  Location: [facility] OR;  Service: Orthopedics;  Laterality: N/A;  . right shoulder arthroscopy  Mar 02, 2011  . TONSILLECTOMY    . TOTAL HIP ARTHROPLASTY  08/23/2011   Procedure: TOTAL HIP ARTHROPLASTY;  Surgeon: Loanne DrillingFrank V Aluisio, MD;  Location: [facility];  Service: Orthopedics;  Laterality: Right;  . TOTAL SHOULDER ARTHROPLASTY Left 04/07/2017   Procedure: LEFT TOTAL SHOULDER ARTHROPLASTY;  Surgeon: Francena HanlySupple, Kevin, MD;  Location: [facility] OR;  Service: Orthopedics;  Laterality: Left;   Social History   Occupational History  . Occupation: Retired  Tobacco Use  . Smoking status: Never Smoker  . Smokeless tobacco: Never Used  Substance and Sexual Activity  . Alcohol use: Yes    Alcohol/week: 2.0 - 3.0 standard drinks    Types: 2 - 3 Cans of beer per week    Comment: rare use of whiskey- q 6 months   . Drug use: No  . Sexual activity: Not on file

## 2018-05-30 NOTE — Telephone Encounter (Signed)
Work in appt today

## 2018-06-20 ENCOUNTER — Encounter (INDEPENDENT_AMBULATORY_CARE_PROVIDER_SITE_OTHER): Payer: Self-pay | Admitting: Orthopaedic Surgery

## 2018-06-20 ENCOUNTER — Ambulatory Visit (INDEPENDENT_AMBULATORY_CARE_PROVIDER_SITE_OTHER): Payer: Medicare Other | Admitting: Orthopaedic Surgery

## 2018-06-20 ENCOUNTER — Ambulatory Visit (INDEPENDENT_AMBULATORY_CARE_PROVIDER_SITE_OTHER): Payer: Self-pay

## 2018-06-20 VITALS — BP 154/79 | HR 68 | Ht 69.0 in | Wt 179.0 lb

## 2018-06-20 DIAGNOSIS — M4712 Other spondylosis with myelopathy, cervical region: Secondary | ICD-10-CM | POA: Diagnosis not present

## 2018-06-20 NOTE — Progress Notes (Signed)
Post-Op Visit Note   Patient: Jason Friedman.           Date of Birth: 08/31/1940           MRN: [id number] Visit Date: 06/20/2018 PCP: Marden Noble, MD   Assessment & Plan: Follow-up anterior and posterior fusion.  Upper level C4-5 anteriorly is bridging nicely.  C5-6 is healing slower.  Continue Aspen collar for 2 more weeks and on return we will remove his collar and shoot a lateral flexion-extension cervical x-ray  Chief Complaint:  Chief Complaint  Patient presents with  . Neck - Follow-up    05/08/2018 C4-6 posterior cervical fusion, wiring, iliac bone marrow aspirate   Visit Diagnoses:  1. Other spondylosis with myelopathy, cervical region     Plan: Continue Aspen collar return 2 weeks for x-rays as described above.  Follow-Up Instructions: Return in about 2 weeks (around 07/04/2018).   Orders:  Orders Placed This Encounter  Procedures  . XR Cervical Spine 2 or 3 views   No orders of the defined types were placed in this encounter.   Imaging: No results found.  PMFS History: Patient Active Problem List   Diagnosis Date Noted  . Cervical pseudoarthrosis (HCC) 05/08/2018  . Pseudoarthrosis of cervical spine (HCC) 04/28/2018  . Cervical stenosis of spine 03/15/2018  . Other spondylosis with myelopathy, cervical region 03/09/2018  . 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 OA (osteoarthritis) of hip 08/23/2011   Past Medical History:  Diagnosis Date  . Anemia   . Arthritis   . Cervical spinal stenosis   . Cervical spine fracture (HCC) 2  . Cervical vertebral fracture (HCC) 25 yrs ago   C 5   . 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 COPD (chronic obstructive pulmonary disease) (HCC)    mild copd per lov note dr Meredeth Ide 04-16-11 on chart  . Difficult intubation    difficult d/t anterior larynx, successful intubation using Glidescope 04/07/17  . Fracture of cervical vertebrae, multiple  (HCC)  in high school   2 cervical vertebrae fx, 3 dislocated cervical vertebrae  . Heart abnormality    heart anatomicall rotated, causes abnormal ekg  . Hypertension    hx on, none current  . Hypogonadism in male   . Hypothyroidism   . Impaired memory    x 2 yrs ago after neck injury  . Insomnia   . PONV (postoperative nausea and vomiting)    likes scopolamine patch, vertigo ,pt. reports that anesthesia "has had problem finding my airway"  . Prostate irregularity    nonfuctional prostate, on testerone gel bid  . Sigmoid diverticulitis   . Sleep apnea    does not use due to weight loss, does not need now, not able to use CPAP, last sleep study- 2018    Family History  Problem Relation Age of Onset  . Breast cancer Mother   . Prostate cancer Father   . Diabetes Sister   . Breast cancer Sister   . Diabetes Sister   . Colon cancer Sister     Past Surgical History:  Procedure Laterality Date  . ANTERIOR CERVICAL DECOMP/DISCECTOMY FUSION N/A 03/15/2018   Procedure: CERVICAL FOUR-FIVE, CERVICAL FIVE-SIX ANTERIOR CERVICAL DECOMPRESSION/DISCECTOMY FUSION ALLOGRAFT AND PLATE;  Surgeon: Eldred Manges, MD;  Location: [facility];  Service: Orthopedics;  Laterality: N/A;  CERVICAL FOUR-FIVE, CERVICAL FIVE-SIX ANTERIOR CERVICAL DECOMPRESSION/DISCECTOMY FUSION ALLOGRAFT AND PLATE  . piliodional cyst  yrs ago   done x 3  . POSTERIOR CERVICAL FUSION/FORAMINOTOMY N/A 05/08/2018   Procedure: C4-C6 POSTERIOR CERVICAL FUSION, WIRING, ILIAC BONE MARROW ASPIRATE;  Surgeon: Eldred Manges, MD;  Location: [facility];  Service: Orthopedics;  Laterality: N/A;  . right shoulder arthroscopy  Mar 02, 2011  . TONSILLECTOMY    . TOTAL HIP ARTHROPLASTY  08/23/2011   Procedure: TOTAL HIP ARTHROPLASTY;  Surgeon: Loanne Drilling, MD;  Location: [facility];  Service: Orthopedics;  Laterality: Right;  . TOTAL SHOULDER ARTHROPLASTY Left 04/07/2017   Procedure: LEFT TOTAL SHOULDER ARTHROPLASTY;  Surgeon: Francena Hanly, MD;   Location: [facility];  Service: Orthopedics;  Laterality: Left;   Social History   Occupational History  . Occupation: Retired  Tobacco Use  . Smoking status: Never Smoker  . Smokeless tobacco: Never Used  Substance and Sexual Activity  . Alcohol use: Yes    Alcohol/week: 2.0 - 3.0 standard drinks    Types: 2 - 3 Cans of beer per week    Comment: rare use of whiskey- q 6 months   . Drug use: No  . Sexual activity: Not on file

## 2018-07-04 ENCOUNTER — Encounter (INDEPENDENT_AMBULATORY_CARE_PROVIDER_SITE_OTHER): Payer: Self-pay | Admitting: Orthopaedic Surgery

## 2018-07-04 ENCOUNTER — Ambulatory Visit (INDEPENDENT_AMBULATORY_CARE_PROVIDER_SITE_OTHER): Payer: Medicare Other | Admitting: Orthopaedic Surgery

## 2018-07-04 ENCOUNTER — Ambulatory Visit (INDEPENDENT_AMBULATORY_CARE_PROVIDER_SITE_OTHER): Payer: Medicare Other

## 2018-07-04 VITALS — BP 152/94 | HR 79 | Ht 69.0 in | Wt 174.0 lb

## 2018-07-04 DIAGNOSIS — Z981 Arthrodesis status: Secondary | ICD-10-CM
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
## 2018-09-05 ENCOUNTER — Ambulatory Visit: Payer: Self-pay | Admitting: Orthopaedic Surgery

## 2019-01-29 ENCOUNTER — Ambulatory Visit: Payer: Self-pay

## 2019-01-29 ENCOUNTER — Ambulatory Visit (INDEPENDENT_AMBULATORY_CARE_PROVIDER_SITE_OTHER): Payer: Medicare Other | Admitting: Orthopaedic Surgery

## 2019-01-29 ENCOUNTER — Encounter: Payer: Self-pay | Admitting: Orthopaedic Surgery

## 2019-01-29 DIAGNOSIS — M542 Cervicalgia: Secondary | ICD-10-CM

## 2019-01-29 NOTE — Progress Notes (Signed)
The patient is a 78 year old gentleman who is 9 months out from a C4-C6 posterior cervical fusion with wiring and 11 months out from an anterior cervical discectomy/decompression with fusion allograft and plate.  This was done by Dr. Lorin Mercy.  The patient does come in the office today saying his neck is "snapping and cracking".  He says is become very painful.  He says both arms and hands are numb.  This is been slowly getting worse for over several months now.  He has had previous carpal tunnel surgery on the left hand but he says that the original diagnosis was a misdiagnosis and that his hand was "messed up".  On examination his anterior and posterior incisions of healed nicely.  He is able to laterally rotate and bend his neck and there is some limited flexion and extension.  There is some slight popping this seems to be more arthritic in nature and not from any implant issues.  His upper extremities do show some weak grip strength bilaterally but good range of motion of the shoulders elbows wrists and hands.  2 views of the cervical spine are seen today and compared to previous films from 6 months ago.  It does appear that there is some consolidation at the disc replacement sites in the mid cervical spine.  There is no evidence of hardware failure but there is arthritic changes at the level above and below that have not changed from previous films.  I gave him reassurance that thus far I do not see any severe changes in the cervical spine but he understands that is not my of expertise but I do feel that it is appropriate for him to have a follow-up with Dr. Lorin Mercy in the next 4 to 5 weeks.  Obviously if his radicular symptoms are worsening he would need to let us know sooner so we could further evaluate this with imaging studies versus even nerve conduction studies.  All question concerns were answered and addressed.

## 2019-03-07 ENCOUNTER — Ambulatory Visit: Payer: Medicare Other | Admitting: Orthopaedic Surgery

## 2019-03-07 NOTE — H&P (Signed)
TOTAL HIP ADMISSION H&P  Patient is admitted for left total hip arthroplasty.  Subjective:  Chief Complaint: left hip pain  HPI: Jason HomansBrooks J Aguino Jr., 78 y.o. male, has a history of pain and functional disability in the left hip(s) due to arthritis and patient has failed non-surgical conservative treatments for greater than 12 weeks to include NSAID's and/or analgesics and activity modification.  Onset of symptoms was gradual starting 1 years ago with gradually worsening course since that time.The patient noted no past surgery on the left hip(s).  Patient currently rates pain in the left hip at 10 out of 10 with activity. Patient has worsening of pain with activity and weight bearing and pain that interfers with activities of daily living. Patient has evidence of severe bone-on-bone arthritis with some collapse of the femoral head. He has a large subchondral cyst and a large marginal osteophyte. by imaging studies. This condition presents safety issues increasing the risk of falls.  There is no current active infection.  Patient Active Problem List   Diagnosis Date Noted  . Cervical pseudoarthrosis (HCC) 05/08/2018  . Pseudoarthrosis of cervical spine (HCC) 04/28/2018  . Cervical stenosis of spine 03/15/2018  . Other spondylosis with myelopathy, cervical region 03/09/2018  . 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 OA (osteoarthritis) of hip 08/23/2011   Past Medical History:  Diagnosis Date  . Anemia   . Arthritis   . Cervical spinal stenosis   . Cervical spine fracture (HCC) 2  . Cervical vertebral fracture (HCC) 25 yrs ago   C 5   . 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 COPD (chronic obstructive pulmonary disease) (HCC)    mild copd per lov note dr Meredeth Idefleming 04-16-11 on chart  . Difficult intubation    difficult d/t anterior larynx, successful intubation using Glidescope 04/07/17  . Fracture of cervical vertebrae,  multiple (HCC)  in high school   2 cervical vertebrae fx, 3 dislocated cervical vertebrae  . Heart abnormality    heart anatomicall rotated, causes abnormal ekg  . Hypertension    hx on, none current  . Hypogonadism in male   . Hypothyroidism   . Impaired memory    x 2 yrs ago after neck injury  . Insomnia   . PONV (postoperative nausea and vomiting)    likes scopolamine patch, vertigo ,pt. reports that anesthesia "has had problem finding my airway"  . Prostate irregularity    nonfuctional prostate, on testerone gel bid  . Sigmoid diverticulitis   . Sleep apnea    does not use due to weight loss, does not need now, not able to use CPAP, last sleep study- 2018    Past Surgical History:  Procedure Laterality Date  . ANTERIOR CERVICAL DECOMP/DISCECTOMY FUSION N/A 03/15/2018   Procedure: CERVICAL FOUR-FIVE, CERVICAL FIVE-SIX ANTERIOR CERVICAL DECOMPRESSION/DISCECTOMY FUSION ALLOGRAFT AND PLATE;  Surgeon: Eldred MangesYates, Mark C, MD;  Location: [facility] OR;  Service: Orthopedics;  Laterality: N/A;  CERVICAL FOUR-FIVE, CERVICAL FIVE-SIX ANTERIOR CERVICAL DECOMPRESSION/DISCECTOMY FUSION ALLOGRAFT AND PLATE  . piliodional cyst  yrs ago   done x 3  . POSTERIOR CERVICAL FUSION/FORAMINOTOMY N/A 05/08/2018   Procedure: C4-C6 POSTERIOR CERVICAL FUSION, WIRING, ILIAC BONE MARROW ASPIRATE;  Surgeon: Eldred MangesYates, Mark C, MD;  Location: [facility] OR;  Service: Orthopedics;  Laterality: N/A;  . right shoulder arthroscopy  Mar 02, 2011  . TONSILLECTOMY    . TOTAL HIP ARTHROPLASTY  08/23/2011   Procedure: TOTAL HIP ARTHROPLASTY;  Surgeon: Loanne Drilling, MD;  Location: [facility];  Service: Orthopedics;  Laterality: Right;  . TOTAL SHOULDER ARTHROPLASTY Left 04/07/2017   Procedure: LEFT TOTAL SHOULDER ARTHROPLASTY;  Surgeon: Francena Hanly, MD;  Location: [facility] OR;  Service: Orthopedics;  Laterality: Left;    No current facility-administered medications for this encounter.    Current Outpatient Medications  Medication Sig Dispense Refill  Last Dose  . ANDROGEL PUMP 20.25 MG/ACT (1.62%) GEL Apply 3 Pump topically 2 (two) times daily.   0 Taking  . Brinzolamide-Brimonidine (SIMBRINZA) 1-0.2 % SUSP Place 1 drop into the left eye 3 (three) times daily.   Taking  . cromolyn (OPTICROM) 4 % ophthalmic solution Place 1 drop into both eyes 3 (three) times daily.    Taking  . doxycycline (VIBRAMYCIN) 100 MG capsule Take 100 mg by mouth 2 (two) times daily as needed (for tick bites). Take for 7 days   Taking  . fluticasone (FLONASE) 50 MCG/ACT nasal spray Place 2 sprays into both nostrils daily.   Taking  . HYDROcodone-acetaminophen (NORCO/VICODIN) 5-325 MG tablet Take 1 tablet by mouth 4 (four) times daily as needed for moderate pain.    Taking  . levothyroxine (SYNTHROID, LEVOTHROID) 137 MCG tablet Take 137 mcg by mouth daily before breakfast.   3 Taking  . losartan (COZAAR) 25 MG tablet Take 25 mg by mouth daily.    Taking  . meclizine (ANTIVERT) 25 MG tablet Take 25 mg by mouth 2 (two) times daily as needed for dizziness.   Taking  . methocarbamol (ROBAXIN) 500 MG tablet TAKE 1 TABLET BY MOUTH 2 TIMES DAILY AS NEEDED FOR MUSCLE SPASMS. 30 tablet 0 Taking  . naproxen sodium (ANAPROX) 550 MG tablet Take 550 mg by mouth 2 (two) times daily with a meal.   Taking  . oxyCODONE (OXY IR/ROXICODONE) 5 MG immediate release tablet Take 1 tablet (5 mg total) by mouth every 4 (four) hours as needed for severe pain ((score 7 to 10)). Post op pain (Patient not taking: Reported on 05/30/2018) 30 tablet 0 Not Taking  . oxyCODONE-acetaminophen (PERCOCET/ROXICET) 5-325 MG tablet Take 1 tablet by mouth every 6 (six) hours as needed for severe pain. (Patient not taking: Reported on 05/30/2018) 30 tablet 0 Not Taking  . predniSONE (DELTASONE) 5 MG tablet Take 5 mg by mouth 3 (three) times daily.    Not Taking  . sildenafil (REVATIO) 20 MG tablet Take 60 mg by mouth daily as needed (ED).   Taking  . tiZANidine (ZANAFLEX) 4 MG tablet Take 4 mg by mouth at bedtime.    Taking   Allergies  Allergen Reactions  . Duloxetine Other (See Comments)    Altered personality - didn't eat, pain all over  . Cefuroxime Axetil [Ceftin] Other (See Comments)    Joint pain  . Gabapentin Other (See Comments)    Visual disturbance, hair loss  . Shellfish Allergy Swelling    SWELLING REACTION UNSPECIFIED   . Sulfa Antibiotics Other (See Comments)    UNSPECIFIED REACTION   . Latex Other (See Comments)    Skin irritation     Social History   Tobacco Use  . Smoking status: Never Smoker  . Smokeless tobacco: Never Used  Substance Use Topics  . Alcohol use: Yes    Alcohol/week: 2.0 - 3.0 standard drinks    Types: 2 - 3 Cans of beer per week    Comment: rare use of whiskey- q 6 months     Family History  Problem  Relation Age of Onset  . Breast cancer Mother   . Prostate cancer Father   . Diabetes Sister   . Breast cancer Sister   . Diabetes Sister   . Colon cancer Sister      Review of Systems  Constitutional: Negative for chills and fever.  Respiratory: Negative for cough and shortness of breath.   Cardiovascular: Negative for chest pain and palpitations.  Gastrointestinal: Negative for nausea and vomiting.  Musculoskeletal: Positive for joint pain.    Objective:  Physical Exam Patient is a 78 year old male.  Well nourished and well developed. General: Alert and oriented x3, cooperative and pleasant, no acute distress. Head: normocephalic, atraumatic, neck supple. Eyes: EOMI. Respiratory: breath sounds clear in all fields, no wheezing, rales, or rhonchi. Cardiovascular: Regular rate and rhythm, no murmurs, gallops or rubs. Abdomen: non-tender to palpation and soft, normoactive bowel sounds. Musculoskeletal: Right Hip Exam: ROM: Flexion to 120, Internal Rotation 30, External Rotation 40, and Abduction 40 without discomfort. There is no tenderness over the greater trochanter bursa.  Left Hip Exam: ROM: Flexion to 90, Internal Rotation 0,  External Rotation 30, and Abduction 30 without discomfort. There is no tenderness over the greater trochanter bursa.  Calves soft and nontender. Motor function intact in LE. Strength 5/5 LE bilaterally. Neuro: Distal pulses 2+. Sensation to light touch intact in LE.  Vital signs in last 24 hours:  Labs:   Estimated body mass index is 25.7 kg/m as calculated from the following:   Height as of 07/04/18: 5\' 9"  (1.753 m).   Weight as of 07/04/18: 78.9 kg.   Imaging Review Plain radiographs demonstrate severe degenerative joint disease of the left hip(s). The bone quality appears to be adequate for age and reported activity level.  Assessment/Plan:  End stage arthritis, left hip(s)  The patient history, physical examination, clinical judgement of the provider and imaging studies are consistent with end stage degenerative joint disease of the left hip(s) and total hip arthroplasty is deemed medically necessary. The treatment options including medical management, injection therapy, arthroscopy and arthroplasty were discussed at length. The risks and benefits of total hip arthroplasty were presented and reviewed. The risks due to aseptic loosening, infection, stiffness, dislocation/subluxation,  thromboembolic complications and other imponderables were discussed.  The patient acknowledged the explanation, agreed to proceed with the plan and consent was signed. Patient is being admitted for inpatient treatment for surgery, pain control, PT, OT, prophylactic antibiotics, VTE prophylaxis, progressive ambulation and ADL's and discharge planning.The patient is planning to be discharged home.   Therapy Plans: HEP Disposition: Home with wife Planned DVT Prophylaxis: aspirin 325mg  BID DME needed: walker PCP: Dr. Josetta Huddle, clearance received TXA: IV Allergies: Gabapentin - hair loss , shellfish -swelling , sulfa - swelling Anesthesia Concerns: states he 'stopped breathing once and was revived'  BMI: 26.2  Other: Norco 5-325 QID for chronic pain management.  - Patient was instructed on what medications to stop prior to surgery. - Follow-up visit in 2 weeks with Dr. Wynelle Link - Begin physical therapy following surgery - Pre-operative lab work as pre-surgical testing - Prescriptions will be provided in hospital at time of discharge  Griffith Citron, PA-C Orthopedic Surgery [facility] [place] [phone]

## 2019-03-14 ENCOUNTER — Encounter (HOSPITAL_COMMUNITY): Payer: Self-pay

## 2019-03-14 NOTE — Patient Instructions (Signed)
DUE TO COVID-19 ONLY ONE VISITOR IS ALLOWED TO COME WITH YOU AND STAY IN THE WAITING ROOM ONLY DURING PRE OP AND PROCEDURE. THE ONE VISITOR MAY VISIT WITH YOU IN YOUR PRIVATE ROOM DURING VISITING HOURS ONLY!!   COVID SWAB TESTING MUST BE COMPLETED ON:   Thursday, Nov. 19, 2020  (Must self quarantine after testing. Follow instructions on handout.)             Your procedure is scheduled on: Monday, Nov. 23, 2020   Report to [facility] Main  Entrance    Report to admitting at 10:10 AM   Call this number if you have problems the morning of surgery [phone]   Bring CPAP mask and tubing day of surgery   Do not eat food:After Midnight.   May have liquids until 9:40 AM day of surgery   CLEAR LIQUID DIET  Foods Allowed                                                                     Foods Excluded  Water, Black Coffee and tea, regular and decaf                             liquids that you cannot  Plain Jell-O in any flavor  (No red)                                           see through such as: Fruit ices (not with fruit pulp)                                     milk, soups, orange juice  Iced Popsicles (No red)                                    All solid food Carbonated beverages, regular and diet                                    Apple juices Sports drinks like Gatorade (No red) Lightly seasoned clear broth or consume(fat free) Sugar, honey syrup  Sample Menu Breakfast                                Lunch                                     Supper Cranberry juice                    Beef broth                            Chicken broth Jell-O  Grape juice                           Apple juice Coffee or tea                        Jell-O                                      Popsicle                                                Coffee or tea                        Coffee or tea   Complete one Ensure drink the morning of surgery at  9:40 AM the day of surgery.   Brush your teeth the morning of surgery.   Do NOT smoke after Midnight   Take these medicines the morning of surgery with A SIP OF WATER:  Gabapentin, Levothyroxine, Meclizine    May use Flonase and eye drops per normal routine day of surgery                               You may not have any metal on your body including jewelry, and body piercings             Do not wear lotions, powders, perfumes/cologne, or deodorant                          Men may shave face and neck.   Do not bring valuables to the hospital. [facility] IS NOT             RESPONSIBLE   FOR VALUABLES.   Contacts, dentures or bridgework may not be worn into surgery.   Bring small overnight bag day of surgery.   Special Instructions: Bring a copy of your healthcare power of attorney and living will documents         the day of surgery if you haven't scanned them in before.              Please read over the following fact sheets you were given:  [facility] - Preparing for Surgery Before surgery, you can play an important role.  Because skin is not sterile, your skin needs to be as free of germs as possible.  You can reduce the number of germs on your skin by washing with CHG (chlorahexidine gluconate) soap before surgery.  CHG is an antiseptic cleaner which kills germs and bonds with the skin to continue killing germs even after washing. Please DO NOT use if you have an allergy to CHG or antibacterial soaps.  If your skin becomes reddened/irritated stop using the CHG and inform your nurse when you arrive at Short Stay. Do not shave (including legs and underarms) for at least 48 hours prior to the first CHG shower.  You may shave your face/neck.  Please follow these instructions carefully:  1.  Shower with CHG Soap the night before surgery and the  morning of surgery.  2.  If you choose to wash your hair, wash your hair first as usual with your normal  shampoo.  3.  After you shampoo,  rinse your hair and body thoroughly to remove the shampoo.                             4.  Use CHG as you would any other liquid soap.  You can apply chg directly to the skin and wash.  Gently with a scrungie or clean washcloth.  5.  Apply the CHG Soap to your body ONLY FROM THE NECK DOWN.   Do   not use on face/ open                           Wound or open sores. Avoid contact with eyes, ears mouth and   genitals (private parts).                       Wash face,  Genitals (private parts) with your normal soap.             6.  Wash thoroughly, paying special attention to the area where your    surgery  will be performed.  7.  Thoroughly rinse your body with warm water from the neck down.  8.  DO NOT shower/wash with your normal soap after using and rinsing off the CHG Soap.                9.  Pat yourself dry with a clean towel.            10.  Wear clean pajamas.            11.  Place clean sheets on your bed the night of your first shower and do not  sleep with pets. Day of Surgery : Do not apply any lotions/deodorants the morning of surgery.  Please wear clean clothes to the hospital/surgery center.  FAILURE TO FOLLOW THESE INSTRUCTIONS MAY RESULT IN THE CANCELLATION OF YOUR SURGERY  PATIENT SIGNATURE_________________________________  NURSE SIGNATURE__________________________________  ________________________________________________________________________   Rogelia Mire  An incentive spirometer is a tool that can help keep your lungs clear and active. This tool measures how well you are filling your lungs with each breath. Taking long deep breaths may help reverse or decrease the chance of developing breathing (pulmonary) problems (especially infection) following:  A long period of time when you are unable to move or be active. BEFORE THE PROCEDURE   If the spirometer includes an indicator to show your best effort, your nurse or respiratory therapist will set it to a desired  goal.  If possible, sit up straight or lean slightly forward. Try not to slouch.  Hold the incentive spirometer in an upright position. INSTRUCTIONS FOR USE  1. Sit on the edge of your bed if possible, or sit up as far as you can in bed or on a chair. 2. Hold the incentive spirometer in an upright position. 3. Breathe out normally. 4. Place the mouthpiece in your mouth and seal your lips tightly around it. 5. Breathe in slowly and as deeply as possible, raising the piston or the ball toward the top of the column. 6. Hold your breath for 3-5 seconds or for as long as possible. Allow the piston or ball to fall to the bottom of the column. 7. Remove the mouthpiece from your mouth  and breathe out normally. 8. Rest for a few seconds and repeat Steps 1 through 7 at least 10 times every 1-2 hours when you are awake. Take your time and take a few normal breaths between deep breaths. 9. The spirometer may include an indicator to show your best effort. Use the indicator as a goal to work toward during each repetition. 10. After each set of 10 deep breaths, practice coughing to be sure your lungs are clear. If you have an incision (the cut made at the time of surgery), support your incision when coughing by placing a pillow or rolled up towels firmly against it. Once you are able to get out of bed, walk around indoors and cough well. You may stop using the incentive spirometer when instructed by your caregiver.  RISKS AND COMPLICATIONS  Take your time so you do not get dizzy or light-headed.  If you are in pain, you may need to take or ask for pain medication before doing incentive spirometry. It is harder to take a deep breath if you are having pain. AFTER USE  Rest and breathe slowly and easily.  It can be helpful to keep track of a log of your progress. Your caregiver can provide you with a simple table to help with this. If you are using the spirometer at home, follow these instructions: SEEK  MEDICAL CARE IF:   You are having difficultly using the spirometer.  You have trouble using the spirometer as often as instructed.  Your pain medication is not giving enough relief while using the spirometer.  You develop fever of 100.5 F (38.1 C) or higher. SEEK IMMEDIATE MEDICAL CARE IF:   You cough up bloody sputum that had not been present before.  You develop fever of 102 F (38.9 C) or greater.  You develop worsening pain at or near the incision site. MAKE SURE YOU:   Understand these instructions.  Will watch your condition.  Will get help right away if you are not doing well or get worse. Document Released: 08/23/2006 Document Revised: 07/05/2011 Document Reviewed: 10/24/2006 Exit[employer] Patient Information 2014 [employer], [place].   ________________________________________________________________________  WHAT IS A BLOOD TRANSFUSION? Blood Transfusion Information  A transfusion is the replacement of blood or some of its parts. Blood is made up of multiple cells which provide different functions.  Red blood cells carry oxygen and are used for blood loss replacement.  White blood cells fight against infection.  Platelets control bleeding.  Plasma helps clot blood.  Other blood products are available for specialized needs, such as hemophilia or other clotting disorders. BEFORE THE TRANSFUSION  Who gives blood for transfusions?   Healthy volunteers who are fully evaluated to make sure their blood is safe. This is blood bank blood. Transfusion therapy is the safest it has ever been in the practice of medicine. Before blood is taken from a donor, a complete history is taken to make sure that person has no history of diseases nor engages in risky social behavior (examples are intravenous drug use or sexual activity with multiple partners). The donor's travel history is screened to minimize risk of transmitting infections, such as malaria. The donated blood is tested for  signs of infectious diseases, such as HIV and hepatitis. The blood is then tested to be sure it is compatible with you in order to minimize the chance of a transfusion reaction. If you or a relative donates blood, this is often done in anticipation of surgery and is not appropriate  for emergency situations. It takes many days to process the donated blood. RISKS AND COMPLICATIONS Although transfusion therapy is very safe and saves many lives, the main dangers of transfusion include:   Getting an infectious disease.  Developing a transfusion reaction. This is an allergic reaction to something in the blood you were given. Every precaution is taken to prevent this. The decision to have a blood transfusion has been considered carefully by your caregiver before blood is given. Blood is not given unless the benefits outweigh the risks. AFTER THE TRANSFUSION  Right after receiving a blood transfusion, you will usually feel much better and more energetic. This is especially true if your red blood cells have gotten low (anemic). The transfusion raises the level of the red blood cells which carry oxygen, and this usually causes an energy increase.  The nurse administering the transfusion will monitor you carefully for complications. HOME CARE INSTRUCTIONS  No special instructions are needed after a transfusion. You may find your energy is better. Speak with your caregiver about any limitations on activity for underlying diseases you may have. SEEK MEDICAL CARE IF:   Your condition is not improving after your transfusion.  You develop redness or irritation at the intravenous (IV) site. SEEK IMMEDIATE MEDICAL CARE IF:  Any of the following symptoms occur over the next 12 hours:  Shaking chills.  You have a temperature by mouth above 102 F (38.9 C), not controlled by medicine.  Chest, back, or muscle pain.  People around you feel you are not acting correctly or are confused.  Shortness of breath  or difficulty breathing.  Dizziness and fainting.  You get a rash or develop hives.  You have a decrease in urine output.  Your urine turns a dark color or changes to pink, red, or brown. Any of the following symptoms occur over the next 10 days:  You have a temperature by mouth above 102 F (38.9 C), not controlled by medicine.  Shortness of breath.  Weakness after normal activity.  The white part of the eye turns yellow (jaundice).  You have a decrease in the amount of urine or are urinating less often.  Your urine turns a dark color or changes to pink, red, or brown. Document Released: 04/09/2000 Document Revised: 07/05/2011 Document Reviewed: 11/27/2007 [facility] Patient Information 2014 [place], [place].  _______________________________________________________________________

## 2019-03-15 ENCOUNTER — Other Ambulatory Visit (HOSPITAL_COMMUNITY)
Admission: RE | Admit: 2019-03-15 | Discharge: 2019-03-15 | Disposition: A | Discharge status: Home / Self Care | Payer: Medicare Other | Source: Ambulatory Visit | Attending: Orthopedic Surgery | Admitting: Orthopedic Surgery

## 2019-03-15 DIAGNOSIS — Z20828 Contact with and (suspected) exposure to other viral communicable diseases: Secondary | ICD-10-CM | POA: Insufficient documentation

## 2019-03-15 DIAGNOSIS — Z01812 Encounter for preprocedural laboratory examination: Secondary | ICD-10-CM | POA: Diagnosis present

## 2019-03-16 ENCOUNTER — Encounter (HOSPITAL_COMMUNITY)
Admission: RE | Admit: 2019-03-16 | Discharge: 2019-03-16 | Disposition: A | Discharge status: Home / Self Care | Payer: Medicare Other | Source: Ambulatory Visit | Attending: Orthopedic Surgery | Admitting: Orthopedic Surgery

## 2019-03-16 ENCOUNTER — Other Ambulatory Visit: Payer: Self-pay

## 2019-03-16 ENCOUNTER — Encounter (HOSPITAL_COMMUNITY): Payer: Self-pay

## 2019-03-16 DIAGNOSIS — Z7901 Long term (current) use of anticoagulants: Secondary | ICD-10-CM | POA: Diagnosis not present

## 2019-03-16 DIAGNOSIS — M1612 Unilateral primary osteoarthritis, left hip: Secondary | ICD-10-CM | POA: Diagnosis not present

## 2019-03-16 DIAGNOSIS — D649 Anemia, unspecified: Secondary | ICD-10-CM | POA: Diagnosis not present

## 2019-03-16 DIAGNOSIS — Z79899 Other long term (current) drug therapy: Secondary | ICD-10-CM | POA: Insufficient documentation

## 2019-03-16 DIAGNOSIS — E039 Hypothyroidism, unspecified: Secondary | ICD-10-CM | POA: Insufficient documentation

## 2019-03-16 DIAGNOSIS — I10 Essential (primary) hypertension: Secondary | ICD-10-CM | POA: Diagnosis not present

## 2019-03-16 DIAGNOSIS — J449 Chronic obstructive pulmonary disease, unspecified: Secondary | ICD-10-CM | POA: Insufficient documentation

## 2019-03-16 DIAGNOSIS — Z01818 Encounter for other preprocedural examination: Secondary | ICD-10-CM | POA: Insufficient documentation

## 2019-03-16 HISTORY — DX: Disorders of diaphragm: J98.6

## 2019-03-16 HISTORY — DX: Other ill-defined heart diseases: I51.89

## 2019-03-16 LAB — COMPREHENSIVE METABOLIC PANEL
ALT: 15 U/L (ref 0–44)
AST: 22 U/L (ref 15–41)
Albumin: 4.6 g/dL (ref 3.5–5.0)
Alkaline Phosphatase: 53 U/L (ref 38–126)
Anion gap: 8 (ref 5–15)
BUN: 21 mg/dL (ref 8–23)
CO2: 29 mmol/L (ref 22–32)
Calcium: 9.3 mg/dL (ref 8.9–10.3)
Chloride: 106 mmol/L (ref 98–111)
Creatinine, Ser: 1.07 mg/dL (ref 0.61–1.24)
GFR calc Af Amer: >60 mL/min (ref >60)
GFR calc non Af Amer: >60 mL/min (ref >60)
Glucose, Bld: 106 mg/dL — ABNORMAL HIGH (ref 70–99)
Potassium: 4.8 mmol/L (ref 3.5–5.1)
Sodium: 143 mmol/L (ref 135–145)
Total Bilirubin: 1.3 mg/dL — ABNORMAL HIGH (ref 0.3–1.2)
Total Protein: 7.4 g/dL (ref 6.5–8.1)

## 2019-03-16 LAB — CBC WITH DIFFERENTIAL/PLATELET
Abs Immature Granulocytes: 0.02 10*3/uL (ref 0.00–0.07)
Basophils Absolute: 0.1 10*3/uL (ref 0.0–0.1)
Basophils Relative: 1 %
Eosinophils Absolute: 0.2 10*3/uL (ref 0.0–0.5)
Eosinophils Relative: 2 %
HCT: 49.2 % (ref 39.0–52.0)
Hemoglobin: 15.9 g/dL (ref 13.0–17.0)
Immature Granulocytes: 0 %
Lymphocytes Relative: 23 %
Lymphs Abs: 1.7 10*3/uL (ref 0.7–4.0)
MCH: 33.3 pg (ref 26.0–34.0)
MCHC: 32.3 g/dL (ref 30.0–36.0)
MCV: 103.1 fL — ABNORMAL HIGH (ref 80.0–100.0)
Monocytes Absolute: 0.5 10*3/uL (ref 0.1–1.0)
Monocytes Relative: 7 %
Neutro Abs: 4.9 10*3/uL (ref 1.7–7.7)
Neutrophils Relative %: 67 %
Platelets: 319 10*3/uL (ref 150–400)
RBC: 4.77 MIL/uL (ref 4.22–5.81)
RDW: 14.7 % (ref 11.5–15.5)
WBC: 7.3 10*3/uL (ref 4.0–10.5)
nRBC: 0 % (ref 0.0–0.2)

## 2019-03-16 LAB — APTT: aPTT: 32 seconds (ref 24–36)

## 2019-03-16 LAB — NOVEL CORONAVIRUS, NAA (HOSP ORDER, SEND-OUT TO REF LAB; TAT 18-24 HRS): SARS-CoV-2, NAA: NOT DETECTED

## 2019-03-16 LAB — SURGICAL PCR SCREEN
MRSA, PCR: NEGATIVE
Staphylococcus aureus: NEGATIVE

## 2019-03-16 LAB — PROTIME-INR
INR: 1 (ref 0.8–1.2)
Prothrombin Time: 13.2 seconds (ref 11.4–15.2)

## 2019-03-16 MED ORDER — CHLORHEXIDINE GLUCONATE 4 % EX LIQD
60.0000 mL | Freq: Once | CUTANEOUS | Status: DC
  Filled 2019-03-16: qty 60

## 2019-03-16 NOTE — Anesthesia Preprocedure Evaluation (Addendum)
Anesthesia Evaluation  Patient identified by MRN, date of birth, ID band Patient awake    Reviewed: Allergy & Precautions, NPO status , Patient's Chart, lab work & pertinent test results  History of Anesthesia Complications (+) PONV, DIFFICULT AIRWAY and history of anesthetic complications  Airway Mallampati: III     Mouth opening: Limited Mouth Opening  Dental no notable dental hx. (+) Dental Advisory Given   Pulmonary    Pulmonary exam normal        Cardiovascular hypertension, Pt. on medications Normal cardiovascular exam  Echo 05/19/2016 Study Conclusions  - Left ventricle: The cavity size was normal. Wall thickness was normal. Systolic function was normal. The estimated ejection fraction was in the range of 55% to 60%. Wall motion was normal; there were no regional wall motion abnormalities. Doppler parameters are consistent with abnormal left ventricular relaxation (grade 1 diastolic dysfunction). - Aortic valve: There was no stenosis. - Aorta: Borderline dilated aortic root. Aortic root dimension: 37 mm (ED). - Mitral valve: There was no significant regurgitation. - Right ventricle: The cavity size was normal. Systolic function was normal. - Pulmonary arteries: No complete TR doppler jet so unable to estimate PA systolic pressure. - Inferior vena cava: The vessel was normal in size. The respirophasic diameter changes were in the normal range (= 50%), consistent with normal central venous pressure.    Neuro/Psych negative neurological ROS  negative psych ROS   GI/Hepatic negative GI ROS, Neg liver ROS,   Endo/Other  Hypothyroidism   Renal/GU negative Renal ROS     Musculoskeletal   Abdominal   Peds  Hematology   Anesthesia Other Findings   Reproductive/Obstetrics                                                            Anesthesia Evaluation  Patient  identified by MRN, date of birth, ID band Patient awake    Reviewed: Allergy & Precautions, NPO status , Patient's Chart, lab work & pertinent test results  History of Anesthesia Complications (+) PONV, DIFFICULT AIRWAY and history of anesthetic complications  Airway Mallampati: II  TM Distance: >3 FB Neck ROM: Limited    Dental no notable dental hx. (+) Teeth Intact, Dental Advisory Given   Pulmonary sleep apnea (does not use CPAP) , COPD,    Pulmonary exam normal breath sounds clear to auscultation       Cardiovascular hypertension, negative cardio ROS Normal cardiovascular exam Rhythm:Regular Rate:Normal     Neuro/Psych negative neurological ROS  negative psych ROS   GI/Hepatic negative GI ROS, Neg liver ROS,   Endo/Other  Hypothyroidism   Renal/GU negative Renal ROS  negative genitourinary   Musculoskeletal  (+) Arthritis , Osteoarthritis,    Abdominal   Peds  Hematology negative hematology ROS (+)   Anesthesia Other Findings Echo 05/19/16: - Normal LV size with EF 55-60%. Normal RV size and systolicfunction. No significant valvular abnormalities  Most recent airway note: - 03/15/18: IV induction, mask ventilation without difficulty. Grade I. Glidescope and 4. Ridgid stylet and Video-laryngoscopy. Oral-tracheal intubation. 1 attempt. 8.0 mm ETT. Difficulty anticipated due to history and neck instability. Comments: Elective glidescope per surgeon due to cervical myelopathy.    Reproductive/Obstetrics  Anesthesia Physical Anesthesia Plan  ASA: III  Anesthesia Plan: General   Post-op Pain Management:    Induction: Intravenous  PONV Risk Score and Plan: 3 and Midazolam, Dexamethasone, Ondansetron and Treatment may vary due to age or medical condition  Airway Management Planned: Oral ETT  Additional Equipment:   Intra-op Plan:   Post-operative Plan: Extubation in OR  Informed  Consent: I have reviewed the patients History and Physical, chart, labs and discussed the procedure including the risks, benefits and alternatives for the proposed anesthesia with the patient or authorized representative who has indicated his/her understanding and acceptance.   Dental advisory given  Plan Discussed with: CRNA  Anesthesia Plan Comments:         Anesthesia Quick Evaluation  Anesthesia Physical Anesthesia Plan  ASA: III  Anesthesia Plan: Spinal   Post-op Pain Management:    Induction:   PONV Risk Score and Plan: 2 and Ondansetron and Propofol infusion  Airway Management Planned: Natural Airway and Simple Face Mask  Additional Equipment:   Intra-op Plan:   Post-operative Plan:   Informed Consent: I have reviewed the patients History and Physical, chart, labs and discussed the procedure including the risks, benefits and alternatives for the proposed anesthesia with the patient or authorized representative who has indicated his/her understanding and acceptance.     Dental advisory given  Plan Discussed with: Anesthesiologist and CRNA  Anesthesia Plan Comments: (See PAT note 03/16/2019, Konrad Felix, PA-C Anesthesia records re: DIFFICULT AIRWAY - 03/15/18: IV induction, mask ventilation without difficulty. Grade I. Glidescope and 4. Ridgid stylet and Video-laryngoscopy. Oral-tracheal intubation. 1 attempt. 8.0 mm ETT. Difficulty anticipated due to history and neck instability.Comments:Elective glidescope per surgeon due to cervical myelopathy. - 04/07/17:IV induction, mask ventilation without difficulty. Grade I. Glidescope and 3. Stylet and Oral airway. Oral-tracheal intubation. 2 attempts. 7.5 mm ETT. Difficulty anticipated due to history and anterior larynx.Future Recommendations:Recommend- induction with short-acting agent, and alternative techniques readily available Comments:DL x1 Mil 3 by SRNA, grade 4 view, DL x1 Mil 3 by CRNA, grade 4 view,  Glide 3 x1, grade 1 view, ATOI. +BBS/etCO2. - 08/23/11:Difficult airway?: (c) Yes Grade View: Grade 3 Placed By: Self;CRNA Airway Device: Endotracheal Tube Laryngoscope Blade: MAC;4 ETT Types: Oral Size (mm): 7.5 mm Cuffed: Cuffed  )      Anesthesia Quick Evaluation

## 2019-03-16 NOTE — Pre-Procedure Instructions (Signed)
PCP - Dr. Brayton Layman. Last office visit 12/2018 Cardiologist - Dr. Vita Barley. Last office visit 05/14/2016. Epic  Chest x-ray - More than a year EKG - 05/04/2018.Epic Stress Test -  ECHO - More 2 year. Cardiac Cath -   Sleep Study -  CPAP -   Fasting Blood Sugar -  Checks Blood Sugar _____ times a day  Blood Thinner Instructions: Aspirin Instructions: Last Dose:  Anesthesia review: COPD, Left paralyzed diaphragm  Patient denies shortness of breath, fever, cough and chest pain at PAT appointment   Patient verbalized understanding of instructions that were given to them at the PAT appointment. Patient was also instructed that they will need to review over the PAT instructions again at home before surgery.

## 2019-03-16 NOTE — Progress Notes (Signed)
Anesthesia Chart Review   Case: [id number] Date/Time: 03/19/19 1225   Procedure: TOTAL HIP ARTHROPLASTY ANTERIOR APPROACH (Left Hip) - 190mn   Anesthesia type: Choice   Pre-op diagnosis: left hip osteoarthritis   [facility]   Surgeon: AGaynelle Arabian MD      DISCUSSION:78 y.o. never smoker with h/o difficult intubation, PONV, hypothyroidism, COPD, HTN, s/o C4-C6 posterior cervical fusion, left hip OA scheduled for above procedure 03/19/2019 with Dr. FGaynelle Arabian  Per previous anesthesia chart review, "Notes indicate patient reported prior history of abnormal EKG (possibly due elevated right hemidiaphragm with "rotated" heart). He was referred to cardiologist Dr. JMinus Breedingin 04/2016 due to abnormal EKG. At that time he reported good exercise tolerance without CV symptoms, soPRN follow-up recommended pending echo results (see below)."  Anesthesia records re: DIFFICULT AIRWAY  - 03/15/18: IV induction, mask ventilation without difficulty. Grade I. Glidescope and 4. Ridgid stylet and Video-laryngoscopy. Oral-tracheal intubation. 1 attempt. 8.0 mm ETT. Difficulty anticipated due to history and neck instability. Comments:Elective glidescope per surgeon due to cervical myelopathy.  - 04/07/17: IV induction, mask ventilation without difficulty. Grade I. Glidescope and 3. Stylet and Oral airway. Oral-tracheal intubation. 2 attempts. 7.5 mm ETT. Difficulty anticipated due to history and anterior larynx. Future Recommendations:Recommend- induction with short-acting agent, and alternative techniques readily available Comments:DL x1 Mil 3 by SRNA, grade 4 view, DL x1 Mil 3 by CRNA, grade 4 view, Glide 3 x1, grade 1 view, ATOI. +BBS/etCO2.  - 08/23/11:Difficult airway?: (c) Yes Grade View: Grade 3 Placed By: Self;CRNA Airway Device: Endotracheal Tube Laryngoscope Blade: MAC;4 ETT Types: Oral Size (mm): 7.5 mm Cuffed: Cuffed   History of difficult airway, but successful  intubations with Glidescope. He denied SOB, cough, fever, chest pain. PFTs stable at last pulmonary visit last year. EKG appears stable."  S/p C4-C6 posterior cervical fusion.  Per anesthesia notes: IV induction, mask ventilation without difficulty. Grade 1.  Glidescope and 4. Oral-tracheal intubation, tube size 7.591mwith 1 attempt, video-layrngoscopy used.  Difficulty was anticipated and difficult airway-due to reduced neck mobility.  4x4 bite block used.   Anticipate pt can proceed with planned procedure barring acute status change.   VS: BP (!) 147/81   Pulse 69   Temp 36.7 C (Oral)   Resp 16   Ht _0  (1.753 m)   SpO2 100%   BMI 25.70 kg/m   PROVIDERS: GaJosetta HuddleMD is PCP   FlWallene HuhMD is pulmonologist ([facility] Last visit2/27/19with 8 month follow-up recommended.  AtStar AgeMD is neurologist LABS: Labs reviewed: Acceptable for surgery. (all labs ordered are listed, but only abnormal results are displayed)  Labs Reviewed  CBC WITH DIFFERENTIAL/PLATELET - Abnormal; Notable for the following components:      Result Value   MCV 103.1 (*)    All other components within normal limits  COMPREHENSIVE METABOLIC PANEL - Abnormal; Notable for the following components:   Glucose, Bld 106 (*)    Total Bilirubin 1.3 (*)    All other components within normal limits  SURGICAL PCR SCREEN  APTT  PROTIME-INR  TYPE AND SCREEN     IMAGES:   EKG: 05/04/2018 Rate 78 bpm Normal sinus rhythm  Normal ECG No significant change since last tracing   CV: Echo 05/19/2016 Study Conclusions  - Left ventricle: The cavity size was normal. Wall thickness was   normal. Systolic function was normal. The estimated ejection   fraction was in the  range of 55% to 60%. Wall motion was normal;   there were no regional wall motion abnormalities. Doppler   parameters are consistent with abnormal left ventricular   relaxation (grade 1 diastolic  dysfunction). - Aortic valve: There was no stenosis. - Aorta: Borderline dilated aortic root. Aortic root dimension: 37   mm (ED). - Mitral valve: There was no significant regurgitation. - Right ventricle: The cavity size was normal. Systolic function   was normal. - Pulmonary arteries: No complete TR doppler jet so unable to   estimate PA systolic pressure. - Inferior vena cava: The vessel was normal in size. The   respirophasic diameter changes were in the normal range (= 50%),   consistent with normal central venous pressure.  Impressions:  - Normal LV size with EF 55-60%. Normal RV size and systolic   function. No significant valvular abnormalities. Past Medical History:  Diagnosis Date  . Anemia   . Arthritis   . Cervical spinal stenosis   . Cervical spine fracture ([place]) 2  . Cervical vertebral fracture ([facility]) 25 yrs ago   C 5   . Chronically elevated hemidiaphragm    Right  . 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 COPD (chronic obstructive pulmonary disease) ([place])    mild copd per lov note dr Raul Del 04-16-11 on chart  . Difficult intubation    difficult d/t anterior larynx, successful intubation using Glidescope 04/07/17  . Fracture of cervical vertebrae, multiple ([facility])  in high school   2 cervical vertebrae fx, 3 dislocated cervical vertebrae  . Grade I diastolic dysfunction    noted on ECHO  . Heart abnormality    heart anatomicall rotated, causes abnormal ekg  . Hypertension    hx on, none current  . Hypogonadism in male   . Hypothyroidism   . Impaired memory    x 2 yrs ago after neck injury  . Insomnia   . Paralysis, diaphragm    Left  . PONV (postoperative nausea and vomiting)    likes scopolamine patch, vertigo ,pt. reports that anesthesia "has had problem finding my airway"  . Prostate irregularity    nonfuctional prostate, on testerone gel bid  . Sigmoid diverticulitis   . Sleep apnea    does not  use due to weight loss, does not need now, not able to use CPAP, last sleep study- 2018    Past Surgical History:  Procedure Laterality Date  . ANTERIOR CERVICAL DECOMP/DISCECTOMY FUSION N/A 03/15/2018   Procedure: CERVICAL FOUR-FIVE, CERVICAL FIVE-SIX ANTERIOR CERVICAL DECOMPRESSION/DISCECTOMY FUSION ALLOGRAFT AND PLATE;  Surgeon: Marybelle Killings, MD;  Location: [place];  Service: Orthopedics;  Laterality: N/A;  CERVICAL FOUR-FIVE, CERVICAL FIVE-SIX ANTERIOR CERVICAL DECOMPRESSION/DISCECTOMY FUSION ALLOGRAFT AND PLATE  . piliodional cyst  yrs ago   done x 3  . POSTERIOR CERVICAL FUSION/FORAMINOTOMY N/A 05/08/2018   Procedure: C4-C6 POSTERIOR CERVICAL FUSION, WIRING, ILIAC BONE MARROW ASPIRATE;  Surgeon: Marybelle Killings, MD;  Location: [place];  Service: Orthopedics;  Laterality: N/A;  . right shoulder arthroscopy  Mar 02, 2011  . TONSILLECTOMY    . TOTAL HIP ARTHROPLASTY  08/23/2011   Procedure: TOTAL HIP ARTHROPLASTY;  Surgeon: Gearlean Alf, MD;  [facility];  Service: Orthopedics;  Laterality: Right;  . TOTAL SHOULDER ARTHROPLASTY Left 04/07/2017   Procedure: LEFT TOTAL SHOULDER ARTHROPLASTY;  Surgeon: Justice Britain, MD;  Location: [place];  Service: Orthopedics;  Laterality: Left;    MEDICATIONS: . amLODipine (  NORVASC) 5 MG tablet  . amoxicillin (AMOXIL) 500 MG tablet  . ANDROGEL PUMP 20.25 MG/ACT (1.62%) GEL  . b complex vitamins tablet  . Brinzolamide-Brimonidine (SIMBRINZA) 1-0.2 % SUSP  . Cholecalciferol (VITAMIN D3) 50 MCG (2000 UT) TABS  . cromolyn (OPTICROM) 4 % ophthalmic solution  . doxycycline (VIBRAMYCIN) 100 MG capsule  . fluticasone (FLONASE) 50 MCG/ACT nasal spray  . gabapentin (NEURONTIN) 100 MG capsule  . HYDROcodone-acetaminophen (NORCO/VICODIN) 5-325 MG tablet  . levothyroxine (SYNTHROID, LEVOTHROID) 137 MCG tablet  . losartan (COZAAR) 50 MG tablet  . meclizine (ANTIVERT) 25 MG tablet  . naproxen (NAPROSYN) 500 MG tablet  . tiZANidine (ZANAFLEX) 4 MG tablet  .  vitamin C (ASCORBIC ACID) 500 MG tablet   . chlorhexidine (HIBICLENS) 4 % liquid 4 application  . chlorhexidine (HIBICLENS) 4 % liquid 4 application    Maia Plan [facility] Pre-Surgical Testing [phone] 03/16/19  1:13 PM

## 2019-03-19 ENCOUNTER — Encounter (HOSPITAL_COMMUNITY)
Admission: RE | Disposition: A | Discharge status: Home / Self Care | Payer: Self-pay | Source: Ambulatory Visit | Attending: Orthopedic Surgery

## 2019-03-19 ENCOUNTER — Inpatient Hospital Stay (HOSPITAL_COMMUNITY): Payer: Medicare Other | Admitting: Physician Assistant

## 2019-03-19 ENCOUNTER — Inpatient Hospital Stay (HOSPITAL_COMMUNITY): Payer: Medicare Other

## 2019-03-19 ENCOUNTER — Other Ambulatory Visit: Payer: Self-pay

## 2019-03-19 ENCOUNTER — Observation Stay (HOSPITAL_COMMUNITY)
Admission: RE | Admit: 2019-03-19 | Discharge: 2019-03-20 | Disposition: A | Discharge status: Home / Self Care | Payer: Medicare Other | Source: Ambulatory Visit | Attending: Orthopedic Surgery | Admitting: Orthopedic Surgery

## 2019-03-19 ENCOUNTER — Telehealth (HOSPITAL_COMMUNITY): Payer: Self-pay | Admitting: *Deleted

## 2019-03-19 ENCOUNTER — Observation Stay (HOSPITAL_COMMUNITY): Payer: Medicare Other

## 2019-03-19 ENCOUNTER — Encounter (HOSPITAL_COMMUNITY): Payer: Self-pay | Admitting: *Deleted

## 2019-03-19 ENCOUNTER — Inpatient Hospital Stay (HOSPITAL_COMMUNITY): Payer: Medicare Other | Admitting: Certified Registered Nurse Anesthetist

## 2019-03-19 DIAGNOSIS — G4730 Sleep apnea, unspecified: Secondary | ICD-10-CM | POA: Diagnosis not present

## 2019-03-19 DIAGNOSIS — Z419 Encounter for procedure for purposes other than remedying health state, unspecified: Secondary | ICD-10-CM

## 2019-03-19 DIAGNOSIS — Z79899 Other long term (current) drug therapy: Secondary | ICD-10-CM | POA: Insufficient documentation

## 2019-03-19 DIAGNOSIS — M169 Osteoarthritis of hip, unspecified: Secondary | ICD-10-CM | POA: Diagnosis present

## 2019-03-19 DIAGNOSIS — Z79890 Hormone replacement therapy (postmenopausal): Secondary | ICD-10-CM | POA: Insufficient documentation

## 2019-03-19 DIAGNOSIS — M25752 Osteophyte, left hip: Secondary | ICD-10-CM | POA: Insufficient documentation

## 2019-03-19 DIAGNOSIS — Z96612 Presence of left artificial shoulder joint: Secondary | ICD-10-CM | POA: Insufficient documentation

## 2019-03-19 DIAGNOSIS — I10 Essential (primary) hypertension: Secondary | ICD-10-CM | POA: Insufficient documentation

## 2019-03-19 DIAGNOSIS — Z7952 Long term (current) use of systemic steroids: Secondary | ICD-10-CM | POA: Insufficient documentation

## 2019-03-19 DIAGNOSIS — J449 Chronic obstructive pulmonary disease, unspecified: Secondary | ICD-10-CM | POA: Diagnosis not present

## 2019-03-19 DIAGNOSIS — Z96643 Presence of artificial hip joint, bilateral: Secondary | ICD-10-CM | POA: Insufficient documentation

## 2019-03-19 DIAGNOSIS — E039 Hypothyroidism, unspecified: Secondary | ICD-10-CM | POA: Insufficient documentation

## 2019-03-19 DIAGNOSIS — M1612 Unilateral primary osteoarthritis, left hip: Principal | ICD-10-CM | POA: Diagnosis present

## 2019-03-19 DIAGNOSIS — M25552 Pain in left hip: Secondary | ICD-10-CM | POA: Diagnosis present

## 2019-03-19 DIAGNOSIS — Z96649 Presence of unspecified artificial hip joint: Secondary | ICD-10-CM

## 2019-03-19 HISTORY — PX: TOTAL HIP ARTHROPLASTY: SHX124

## 2019-03-19 LAB — TYPE AND SCREEN
ABO/RH(D): A POS
Antibody Screen: NEGATIVE

## 2019-03-19 SURGERY — ARTHROPLASTY, HIP, TOTAL, ANTERIOR APPROACH
Anesthesia: Spinal | Site: Hip | Laterality: Left

## 2019-03-19 MED ORDER — ACETAMINOPHEN 10 MG/ML IV SOLN
1000.0000 mg | Freq: Four times a day (QID) | INTRAVENOUS | Status: DC
  Administered 2019-03-19: 1000 mg via INTRAVENOUS
  Filled 2019-03-19 (×2): qty 100

## 2019-03-19 MED ORDER — PROPOFOL 500 MG/50ML IV EMUL
INTRAVENOUS | Status: DC | PRN
  Administered 2019-03-19: 160 ug/kg/min via INTRAVENOUS

## 2019-03-19 MED ORDER — DOCUSATE SODIUM 100 MG PO CAPS
100.0000 mg | ORAL_CAPSULE | Freq: Two times a day (BID) | ORAL | Status: DC
  Administered 2019-03-19 – 2019-03-20 (×2): 100 mg via ORAL
  Filled 2019-03-19 (×2): qty 1

## 2019-03-19 MED ORDER — LEVOTHYROXINE SODIUM 25 MCG PO TABS
137.0000 ug | ORAL_TABLET | Freq: Every day | ORAL | Status: DC
  Administered 2019-03-20: 137 ug via ORAL
  Filled 2019-03-19: qty 1

## 2019-03-19 MED ORDER — SODIUM CHLORIDE 0.9 % IV SOLN
INTRAVENOUS | Status: DC
  Administered 2019-03-20: 03:00:00 via INTRAVENOUS

## 2019-03-19 MED ORDER — MAGNESIUM CITRATE PO SOLN: 1.0000 | Freq: Once | ORAL | Status: DC | PRN

## 2019-03-19 MED ORDER — PHENYLEPHRINE 40 MCG/ML (10ML) SYRINGE FOR IV PUSH (FOR BLOOD PRESSURE SUPPORT)
PREFILLED_SYRINGE | INTRAVENOUS | Status: DC | PRN
  Administered 2019-03-19 (×10): 80 ug via INTRAVENOUS

## 2019-03-19 MED ORDER — METOCLOPRAMIDE HCL 5 MG/ML IJ SOLN: 5.0000 mg | Freq: Three times a day (TID) | INTRAMUSCULAR | Status: DC | PRN

## 2019-03-19 MED ORDER — ONDANSETRON HCL 4 MG/2ML IJ SOLN
INTRAMUSCULAR | Status: DC | PRN
  Administered 2019-03-19: 4 mg via INTRAVENOUS

## 2019-03-19 MED ORDER — DEXAMETHASONE SODIUM PHOSPHATE 10 MG/ML IJ SOLN
INTRAMUSCULAR | Status: AC
  Filled 2019-03-19: qty 1

## 2019-03-19 MED ORDER — TRANEXAMIC ACID-NACL 1000-0.7 MG/100ML-% IV SOLN
1000.0000 mg | Freq: Once | INTRAVENOUS | Status: AC
  Administered 2019-03-19: 1000 mg via INTRAVENOUS
  Filled 2019-03-19: qty 100

## 2019-03-19 MED ORDER — MENTHOL 3 MG MT LOZG: 1.0000 | LOZENGE | OROMUCOSAL | Status: DC | PRN

## 2019-03-19 MED ORDER — PROPOFOL 500 MG/50ML IV EMUL
INTRAVENOUS | Status: AC
  Filled 2019-03-19: qty 150

## 2019-03-19 MED ORDER — LOSARTAN POTASSIUM 50 MG PO TABS
50.0000 mg | ORAL_TABLET | Freq: Every evening | ORAL | Status: DC
  Administered 2019-03-19: 50 mg via ORAL
  Filled 2019-03-19: qty 1

## 2019-03-19 MED ORDER — ASPIRIN EC 325 MG PO TBEC
325.0000 mg | DELAYED_RELEASE_TABLET | Freq: Two times a day (BID) | ORAL | Status: DC
  Administered 2019-03-20: 325 mg via ORAL
  Filled 2019-03-19: qty 1

## 2019-03-19 MED ORDER — GABAPENTIN 100 MG PO CAPS
100.0000 mg | ORAL_CAPSULE | Freq: Three times a day (TID) | ORAL | Status: DC
  Administered 2019-03-19 – 2019-03-20 (×3): 100 mg via ORAL
  Filled 2019-03-19 (×3): qty 1

## 2019-03-19 MED ORDER — PHENOL 1.4 % MT LIQD: 1.0000 | OROMUCOSAL | Status: DC | PRN

## 2019-03-19 MED ORDER — PHENYLEPHRINE 40 MCG/ML (10ML) SYRINGE FOR IV PUSH (FOR BLOOD PRESSURE SUPPORT)
PREFILLED_SYRINGE | INTRAVENOUS | Status: AC
  Filled 2019-03-19: qty 20

## 2019-03-19 MED ORDER — PROMETHAZINE HCL 25 MG/ML IJ SOLN: 6.2500 mg | INTRAMUSCULAR | Status: DC | PRN

## 2019-03-19 MED ORDER — PROPOFOL 500 MG/50ML IV EMUL
INTRAVENOUS | Status: AC
  Filled 2019-03-19: qty 50

## 2019-03-19 MED ORDER — VANCOMYCIN HCL IN DEXTROSE 1-5 GM/200ML-% IV SOLN
1000.0000 mg | INTRAVENOUS | Status: AC
  Administered 2019-03-19: 1000 mg via INTRAVENOUS
  Filled 2019-03-19: qty 200

## 2019-03-19 MED ORDER — 0.9 % SODIUM CHLORIDE (POUR BTL) OPTIME
TOPICAL | Status: DC | PRN
  Administered 2019-03-19: 1000 mL

## 2019-03-19 MED ORDER — BUPIVACAINE-EPINEPHRINE (PF) 0.25% -1:200000 IJ SOLN
INTRAMUSCULAR | Status: DC | PRN
  Administered 2019-03-19: 30 mL

## 2019-03-19 MED ORDER — POVIDONE-IODINE 10 % EX SWAB
2.0000 "application " | Freq: Once | CUTANEOUS | Status: AC
  Administered 2019-03-19: 2 via TOPICAL

## 2019-03-19 MED ORDER — METOCLOPRAMIDE HCL 5 MG PO TABS: 5.0000 mg | ORAL_TABLET | Freq: Three times a day (TID) | ORAL | Status: DC | PRN

## 2019-03-19 MED ORDER — ONDANSETRON HCL 4 MG/2ML IJ SOLN: 4.0000 mg | Freq: Four times a day (QID) | INTRAMUSCULAR | Status: DC | PRN

## 2019-03-19 MED ORDER — DEXAMETHASONE SODIUM PHOSPHATE 10 MG/ML IJ SOLN
8.0000 mg | Freq: Once | INTRAMUSCULAR | Status: AC
  Administered 2019-03-19: 8 mg via INTRAVENOUS

## 2019-03-19 MED ORDER — ONDANSETRON HCL 4 MG PO TABS: 4.0000 mg | ORAL_TABLET | Freq: Four times a day (QID) | ORAL | Status: DC | PRN

## 2019-03-19 MED ORDER — TIZANIDINE HCL 4 MG PO TABS
4.0000 mg | ORAL_TABLET | Freq: Every day | ORAL | Status: DC
  Administered 2019-03-19: 4 mg via ORAL
  Filled 2019-03-19: qty 1

## 2019-03-19 MED ORDER — METHOCARBAMOL 500 MG IVPB - SIMPLE MED
500.0000 mg | Freq: Four times a day (QID) | INTRAVENOUS | Status: DC | PRN
  Filled 2019-03-19: qty 50

## 2019-03-19 MED ORDER — MECLIZINE HCL 25 MG PO TABS
25.0000 mg | ORAL_TABLET | Freq: Two times a day (BID) | ORAL | Status: DC
  Administered 2019-03-19 – 2019-03-20 (×2): 25 mg via ORAL
  Filled 2019-03-19 (×2): qty 1

## 2019-03-19 MED ORDER — LACTATED RINGERS IV SOLN
INTRAVENOUS | Status: DC
  Administered 2019-03-19 (×3): via INTRAVENOUS

## 2019-03-19 MED ORDER — BUPIVACAINE-EPINEPHRINE 0.25% -1:200000 IJ SOLN
INTRAMUSCULAR | Status: AC
  Filled 2019-03-19: qty 1

## 2019-03-19 MED ORDER — FLUTICASONE PROPIONATE 50 MCG/ACT NA SUSP
2.0000 | Freq: Every day | NASAL | Status: DC
  Filled 2019-03-19: qty 16

## 2019-03-19 MED ORDER — EPHEDRINE SULFATE-NACL 50-0.9 MG/10ML-% IV SOSY
PREFILLED_SYRINGE | INTRAVENOUS | Status: DC | PRN
  Administered 2019-03-19: 10 mg via INTRAVENOUS
  Administered 2019-03-19 (×2): 5 mg via INTRAVENOUS

## 2019-03-19 MED ORDER — PROPOFOL 10 MG/ML IV BOLUS
INTRAVENOUS | Status: AC
  Filled 2019-03-19: qty 20

## 2019-03-19 MED ORDER — AMLODIPINE BESYLATE 5 MG PO TABS
5.0000 mg | ORAL_TABLET | Freq: Every evening | ORAL | Status: DC
  Administered 2019-03-19: 5 mg via ORAL
  Filled 2019-03-19: qty 1

## 2019-03-19 MED ORDER — BUPIVACAINE IN DEXTROSE 0.75-8.25 % IT SOLN
INTRATHECAL | Status: DC | PRN
  Administered 2019-03-19: 1.6 mL via INTRATHECAL

## 2019-03-19 MED ORDER — WATER FOR IRRIGATION, STERILE IR SOLN
Status: DC | PRN
  Administered 2019-03-19: 2000 mL

## 2019-03-19 MED ORDER — PHENYLEPHRINE 40 MCG/ML (10ML) SYRINGE FOR IV PUSH (FOR BLOOD PRESSURE SUPPORT)
PREFILLED_SYRINGE | INTRAVENOUS | Status: AC
  Filled 2019-03-19: qty 10

## 2019-03-19 MED ORDER — PROPOFOL 10 MG/ML IV BOLUS
INTRAVENOUS | Status: DC | PRN
  Administered 2019-03-19 (×2): 20 mg via INTRAVENOUS

## 2019-03-19 MED ORDER — LIDOCAINE 2% (20 MG/ML) 5 ML SYRINGE
INTRAMUSCULAR | Status: AC
  Filled 2019-03-19: qty 5

## 2019-03-19 MED ORDER — TRANEXAMIC ACID-NACL 1000-0.7 MG/100ML-% IV SOLN
1000.0000 mg | INTRAVENOUS | Status: AC
  Administered 2019-03-19: 1000 mg via INTRAVENOUS
  Filled 2019-03-19: qty 100

## 2019-03-19 MED ORDER — TRAMADOL HCL 50 MG PO TABS: 50.0000 mg | ORAL_TABLET | Freq: Four times a day (QID) | ORAL | Status: DC | PRN

## 2019-03-19 MED ORDER — EPHEDRINE 5 MG/ML INJ
INTRAVENOUS | Status: AC
  Filled 2019-03-19: qty 10

## 2019-03-19 MED ORDER — ONDANSETRON HCL 4 MG/2ML IJ SOLN
INTRAMUSCULAR | Status: AC
  Filled 2019-03-19: qty 2

## 2019-03-19 MED ORDER — FENTANYL CITRATE (PF) 100 MCG/2ML IJ SOLN: 25.0000 ug | INTRAMUSCULAR | Status: DC | PRN

## 2019-03-19 MED ORDER — OXYCODONE HCL 5 MG PO TABS
5.0000 mg | ORAL_TABLET | ORAL | Status: DC | PRN
  Administered 2019-03-19: 10 mg via ORAL
  Administered 2019-03-19: 5 mg via ORAL
  Administered 2019-03-19 – 2019-03-20 (×2): 10 mg via ORAL
  Filled 2019-03-19 (×2): qty 2
  Filled 2019-03-19: qty 1
  Filled 2019-03-19 (×2): qty 2

## 2019-03-19 MED ORDER — DEXAMETHASONE SODIUM PHOSPHATE 10 MG/ML IJ SOLN
10.0000 mg | Freq: Once | INTRAMUSCULAR | Status: AC
  Administered 2019-03-20: 10 mg via INTRAVENOUS
  Filled 2019-03-19: qty 1

## 2019-03-19 MED ORDER — ACETAMINOPHEN 500 MG PO TABS
1000.0000 mg | ORAL_TABLET | Freq: Four times a day (QID) | ORAL | Status: DC
  Administered 2019-03-19 – 2019-03-20 (×3): 1000 mg via ORAL
  Filled 2019-03-19 (×3): qty 2

## 2019-03-19 MED ORDER — VANCOMYCIN HCL IN DEXTROSE 1-5 GM/200ML-% IV SOLN
1000.0000 mg | Freq: Two times a day (BID) | INTRAVENOUS | Status: AC
  Administered 2019-03-19: 1000 mg via INTRAVENOUS
  Filled 2019-03-19: qty 200

## 2019-03-19 MED ORDER — MORPHINE SULFATE (PF) 2 MG/ML IV SOLN
0.5000 mg | INTRAVENOUS | Status: DC | PRN
  Administered 2019-03-19: 1 mg via INTRAVENOUS
  Filled 2019-03-19: qty 1

## 2019-03-19 MED ORDER — LIDOCAINE HCL (CARDIAC) PF 100 MG/5ML IV SOSY
PREFILLED_SYRINGE | INTRAVENOUS | Status: DC | PRN
  Administered 2019-03-19: 80 mg via INTRAVENOUS

## 2019-03-19 MED ORDER — BISACODYL 10 MG RE SUPP: 10.0000 mg | Freq: Every day | RECTAL | Status: DC | PRN

## 2019-03-19 MED ORDER — POLYETHYLENE GLYCOL 3350 17 G PO PACK: 17.0000 g | PACK | Freq: Every day | ORAL | Status: DC | PRN

## 2019-03-19 MED ORDER — CROMOLYN SODIUM 4 % OP SOLN
1.0000 [drp] | Freq: Two times a day (BID) | OPHTHALMIC | Status: DC
  Administered 2019-03-19: 1 [drp] via OPHTHALMIC
  Filled 2019-03-19: qty 10

## 2019-03-19 MED ORDER — METHOCARBAMOL 500 MG PO TABS
500.0000 mg | ORAL_TABLET | Freq: Four times a day (QID) | ORAL | Status: DC | PRN
  Administered 2019-03-19: 500 mg via ORAL
  Filled 2019-03-19: qty 1

## 2019-03-19 SURGICAL SUPPLY — 49 items
BAG DECANTER FOR FLEXI CONT (MISCELLANEOUS) IMPLANT
BAG ZIPLOCK 12X15 (MISCELLANEOUS) IMPLANT
BLADE SAG 18X100X1.27 (BLADE) ×3 IMPLANT
CLOSURE WOUND 1/2 X4 (GAUZE/BANDAGES/DRESSINGS) ×1
COVER PERINEAL POST (MISCELLANEOUS) ×3 IMPLANT
COVER SURGICAL LIGHT HANDLE (MISCELLANEOUS) ×3 IMPLANT
COVER WAND RF STERILE (DRAPES) IMPLANT
CUP ACETBLR 54 OD PINNACLE (Hips) ×2 IMPLANT
DECANTER SPIKE VIAL GLASS SM (MISCELLANEOUS) ×3 IMPLANT
DRAPE STERI IOBAN 125X83 (DRAPES) ×3 IMPLANT
DRAPE U-SHAPE 47X51 STRL (DRAPES) ×6 IMPLANT
DRSG ADAPTIC 3X8 NADH LF (GAUZE/BANDAGES/DRESSINGS) ×3 IMPLANT
DRSG MEPILEX BORDER 4X4 (GAUZE/BANDAGES/DRESSINGS) ×3 IMPLANT
DRSG MEPILEX BORDER 4X8 (GAUZE/BANDAGES/DRESSINGS) ×3 IMPLANT
DURAPREP 26ML APPLICATOR (WOUND CARE) ×3 IMPLANT
ELECT REM PT RETURN 15FT ADLT (MISCELLANEOUS) ×3 IMPLANT
EVACUATOR 1/8 PVC DRAIN (DRAIN) ×3 IMPLANT
GLOVE BIO SURGEON STRL SZ 6 (GLOVE) IMPLANT
GLOVE BIO SURGEON STRL SZ7 (GLOVE) IMPLANT
GLOVE BIO SURGEON STRL SZ8 (GLOVE) ×3 IMPLANT
GLOVE BIOGEL PI IND STRL 6.5 (GLOVE) IMPLANT
GLOVE BIOGEL PI IND STRL 7.0 (GLOVE) IMPLANT
GLOVE BIOGEL PI IND STRL 8 (GLOVE) ×1 IMPLANT
GLOVE BIOGEL PI INDICATOR 6.5 (GLOVE)
GLOVE BIOGEL PI INDICATOR 7.0 (GLOVE)
GLOVE BIOGEL PI INDICATOR 8 (GLOVE) ×2
GOWN STRL REUS W/TWL LRG LVL3 (GOWN DISPOSABLE) ×3 IMPLANT
GOWN STRL REUS W/TWL XL LVL3 (GOWN DISPOSABLE) IMPLANT
HEAD M SROM 36MM PLUS 1.5 (Hips) IMPLANT
HOLDER FOLEY CATH W/STRAP (MISCELLANEOUS) ×3 IMPLANT
KIT TURNOVER KIT A (KITS) IMPLANT
LINER MARATHON NEUT +4X54X36 (Hips) ×2 IMPLANT
MANIFOLD NEPTUNE II (INSTRUMENTS) ×3 IMPLANT
PACK ANTERIOR HIP CUSTOM (KITS) ×3 IMPLANT
PENCIL SMOKE EVACUATOR (MISCELLANEOUS) IMPLANT
SROM M HEAD 36MM PLUS 1.5 (Hips) ×3 IMPLANT
STEM FEMORAL SZ6 HIGH ACTIS (Stem) ×2 IMPLANT
STRIP CLOSURE SKIN 1/2X4 (GAUZE/BANDAGES/DRESSINGS) ×2 IMPLANT
SUT ETHIBOND NAB CT1 #1 30IN (SUTURE) ×3 IMPLANT
SUT MNCRL AB 4-0 PS2 18 (SUTURE) ×3 IMPLANT
SUT STRATAFIX 0 PDS 27 VIOLET (SUTURE) ×3
SUT VIC AB 2-0 CT1 27 (SUTURE) ×4
SUT VIC AB 2-0 CT1 TAPERPNT 27 (SUTURE) ×2 IMPLANT
SUTURE STRATFX 0 PDS 27 VIOLET (SUTURE) ×1 IMPLANT
SYR 50ML LL SCALE MARK (SYRINGE) IMPLANT
TRAY FOL W/BAG SLVR 16FR STRL (SET/KITS/TRAYS/PACK) IMPLANT
TRAY FOLEY MTR SLVR 16FR STAT (SET/KITS/TRAYS/PACK) ×1 IMPLANT
TRAY FOLEY W/BAG SLVR 16FR LF (SET/KITS/TRAYS/PACK) ×2
YANKAUER SUCT BULB TIP 10FT TU (MISCELLANEOUS) ×3 IMPLANT

## 2019-03-19 NOTE — Transfer of Care (Signed)
Immediate Anesthesia Transfer of Care Note  Patient: Jason Friedman.  Procedure(s) Performed: TOTAL HIP ARTHROPLASTY ANTERIOR APPROACH (Left Hip)  Patient Location: PACU  Anesthesia Type:Spinal  Level of Consciousness: awake, alert , oriented and patient cooperative  Airway & Oxygen Therapy: Patient Spontanous Breathing and Patient connected to face mask oxygen  Post-op Assessment: Report given to RN, Post -op Vital signs reviewed and stable and Patient moving all extremities  Post vital signs: Reviewed and stable  Last Vitals:  Vitals Value Taken Time  BP 135/70 03/19/19 1330  Temp 36.3 C 03/19/19 1324  Pulse 80 03/19/19 1334  Resp 25 03/19/19 1334  SpO2 100 % 03/19/19 1334  Vitals shown include unvalidated device data.  Last Pain:  Vitals:   03/19/19 1324  TempSrc:   PainSc: 0-No pain         Complications: No apparent anesthesia complications

## 2019-03-19 NOTE — Evaluation (Signed)
Physical Therapy Evaluation Patient Details Name: Jason Friedman. MRN: [id number] DOB: 13-Aug-1940 Today's Date: 03/19/2019   History of Present Illness  Patient is 78 y.o. male s/p Lt THA anterior approach on 03/19/19 with PMH significant for OA, HTN, COPD, multiple C-spine surgeries (fusions C4-6).    Clinical Impression  Konner Saiz. is a 78 y.o. male POD 0 s/p Lt THA anterior approach. Patient reports independence with mobility at baseline. Patient is now limited by functional impairments (see PT problem list below) and requires min guard/assist for transfers and gait with RW. Patient was able to ambulate ~100 feet with RW and min guard/assist. Patient instructed in exercise for LE strength and to facilitate circulation. Patient will benefit from continued skilled PT interventions to address impairments and progress towards PLOF. Acute PT will follow to progress mobility and stair training in preparation for safe discharge home.    Follow Up Recommendations Follow surgeon's recommendation for DC plan and follow-up therapies    Equipment Recommendations  Rolling walker with 5" wheels    Recommendations for Other Services       Precautions / Restrictions Precautions Precautions: Fall Restrictions Weight Bearing Restrictions: No      Mobility  Bed Mobility Overal bed mobility: Needs Assistance Bed Mobility: Supine to Sit     Supine to sit: HOB elevated;Supervision     General bed mobility comments: cues to turn to EOB, no assist required  Transfers Overall transfer level: Needs assistance Equipment used: Rolling walker (2 wheeled) Transfers: Sit to/from Stand Sit to Stand: Min guard         General transfer comment: cues for safe hand placement and technique with RW, pt able to initiate power up and rise  Ambulation/Gait Ambulation/Gait assistance: Min guard Gait Distance (Feet): 100 Feet Assistive device: Rolling walker (2 wheeled) Gait  Pattern/deviations: Step-through pattern;Decreased stride length Gait velocity: decreased   General Gait Details: pt with good step through pattern and maintained safe proximity to RW throughout, 1 episode of min assist as pt "tested his balance"  by letting go of the walker and stepping, pt unsteady and educated on safety concerns and fall risk  Stairs            Wheelchair Mobility    Modified Rankin (Stroke Patients Only)       Balance Overall balance assessment: Needs assistance Sitting-balance support: No upper extremity supported;Feet supported Sitting balance-Leahy Scale: Good     Standing balance support: During functional activity;Bilateral upper extremity supported Standing balance-Leahy Scale: Fair          Pertinent Vitals/Pain Pain Assessment: 0-10 Pain Score: 3  Pain Location: Lt hip Pain Descriptors / Indicators: Aching;Sore Pain Intervention(s): Limited activity within patient's tolerance;Monitored during session;Repositioned    Home Living Family/patient expects to be discharged to:: Private residence Living Arrangements: Spouse/significant other Available Help at Discharge: Family;Available 24 hours/day Type of Home: House Home Access: Stairs to enter Entrance Stairs-Rails: None;Right;Left Entrance Stairs-Number of Steps: 9 in front with 2 hand rails, 3 in back with none Home Layout: Two level;Able to live on main level with bedroom/bathroom;Full bath on main level Home Equipment: Walker - 2 wheels;Kasandra Knudsen - single point Additional Comments: pt enjoys target shooting and walks the family [place] of [place] acres. pt has a workshop for tools and hand saws. he enjoys mowing the lawn and chopping/splitting wood.    Prior Function Level of Independence: Independent         Comments: mowing the yard (5 hours)  Hand Dominance   Dominant Hand: Right    Extremity/Trunk Assessment   Upper Extremity Assessment Upper Extremity Assessment: Overall WFL  for tasks assessed    Lower Extremity Assessment Lower Extremity Assessment: Overall WFL for tasks assessed    Cervical / Trunk Assessment Cervical / Trunk Assessment: Normal  Communication   Communication: No difficulties  Cognition Arousal/Alertness: Awake/alert Behavior During Therapy: WFL for tasks assessed/performed Overall Cognitive Status: Within Functional Limits for tasks assessed                 General Comments      Exercises Total Joint Exercises Ankle Circles/Pumps: AROM;20 reps;Seated;Both Long Arc Quad: AROM;10 reps;Left;Seated   Assessment/Plan    PT Assessment Patient needs continued PT services  PT Problem List Decreased strength;Decreased balance;Decreased range of motion;Decreased mobility;Decreased activity tolerance;Decreased knowledge of use of DME       PT Treatment Interventions Functional mobility training;DME instruction;Gait training;Therapeutic activities;Stair training;Therapeutic exercise;Balance training;Patient/family education    PT Goals (Current goals can be found in the Care Plan section)  Acute Rehab PT Goals Patient Stated Goal: return home and get back to walking independent PT Goal Formulation: With patient Time For Goal Achievement: 03/26/19 Potential to Achieve Goals: Good    Frequency 7X/week    AM-PAC PT "6 Clicks" Mobility  Outcome Measure Help needed turning from your back to your side while in a flat bed without using bedrails?: A Little Help needed moving from lying on your back to sitting on the side of a flat bed without using bedrails?: A Little Help needed moving to and from a bed to a chair (including a wheelchair)?: A Little Help needed standing up from a chair using your arms (e.g., wheelchair or bedside chair)?: A Little Help needed to walk in hospital room?: A Little Help needed climbing 3-5 steps with a railing? : A Little 6 Click Score: 18    End of Session Equipment Utilized During Treatment: Gait  belt Activity Tolerance: Patient tolerated treatment well Patient left: in chair;with call bell/phone within reach;with family/visitor present;with chair alarm set Nurse Communication: Mobility status PT Visit Diagnosis: Muscle weakness (generalized) (M62.81);Difficulty in walking, not elsewhere classified (R26.2)    Time: 1443-1540 PT Time Calculation (min) (ACUTE ONLY): 25 min   Charges:   PT Evaluation $PT Eval Low Complexity: 1 Low PT Treatments $Therapeutic Exercise: 8-22 mins        Valentino Saxon, PT, DPT Physical Therapist with [facility]  03/19/2019 6:20 PM

## 2019-03-19 NOTE — Anesthesia Postprocedure Evaluation (Signed)
Anesthesia Post Note  Patient: Jason Friedman.  Procedure(s) Performed: TOTAL HIP ARTHROPLASTY ANTERIOR APPROACH (Left Hip)     Patient location during evaluation: PACU Anesthesia Type: Spinal Level of consciousness: awake and alert Pain management: pain level controlled Vital Signs Assessment: post-procedure vital signs reviewed and stable Respiratory status: spontaneous breathing and respiratory function stable Cardiovascular status: blood pressure returned to baseline and stable Postop Assessment: spinal receding Anesthetic complications: no    Last Vitals:  Vitals:   03/19/19 1400 03/19/19 1421  BP: 128/72 137/77  Pulse: 73 78  Resp: (!) 8   Temp:    SpO2: 100% 100%    Last Pain:  Vitals:   03/19/19 1345  TempSrc:   PainSc: 0-No pain                 Kathy Wahid DANIEL

## 2019-03-19 NOTE — Anesthesia Procedure Notes (Signed)
Spinal  Patient location during procedure: OR Start time: 03/19/2019 11:31 AM End time: 03/19/2019 11:38 AM Staffing Anesthesiologist: Duane Boston, MD Performed: anesthesiologist  Preanesthetic Checklist Completed: patient identified, surgical consent, pre-op evaluation, timeout performed, IV checked, risks and benefits discussed and monitors and equipment checked Spinal Block Patient position: sitting Prep: DuraPrep Patient monitoring: cardiac monitor, continuous pulse ox and blood pressure Approach: midline Location: L2-3 Injection technique: single-shot Needle Needle type: Pencan  Needle gauge: 24 G Needle length: 9 cm Additional Notes Functioning IV was confirmed and monitors were applied. Sterile prep and drape, including hand hygiene and sterile gloves were used. The patient was positioned and the spine was prepped. The skin was anesthetized with lidocaine.  Free flow of clear CSF was obtained prior to injecting local anesthetic into the CSF.  The spinal needle aspirated freely following injection.  The needle was carefully withdrawn.  The patient tolerated the procedure well.

## 2019-03-19 NOTE — Interval H&P Note (Signed)
History and Physical Interval Note:  03/19/2019 9:34 AM  Jason Friedman.  has presented today for surgery, with the diagnosis of left hip osteoarthritis.  The various methods of treatment have been discussed with the patient and family. After consideration of risks, benefits and other options for treatment, the patient has consented to  Procedure(s) with comments: [place] (Left) - 113min as a surgical intervention.  The patient's history has been reviewed, patient examined, no change in status, stable for surgery.  I have reviewed the patient's chart and labs.  Questions were answered to the patient's satisfaction.     [name]

## 2019-03-19 NOTE — Op Note (Signed)
OPERATIVE REPORT- TOTAL HIP ARTHROPLASTY   PREOPERATIVE DIAGNOSIS: Osteoarthritis of the Left hip.   POSTOPERATIVE DIAGNOSIS: Osteoarthritis of the Left  hip.   PROCEDURE: Left total hip arthroplasty, anterior approach.   SURGEON: Ollen Gross, MD   ASSISTANT: Arther Abbott, PA-C  ANESTHESIA:  Spinal  ESTIMATED BLOOD LOSS:-550 mL    DRAINS: Hemovac x1.   COMPLICATIONS: None   CONDITION: PACU - hemodynamically stable.   BRIEF CLINICAL NOTE: Jason Friedman. is a 78 y.o. male who has advanced end-  stage arthritis of their Left  hip with progressively worsening pain and  dysfunction.The patient has failed nonoperative management and presents for  total hip arthroplasty.   PROCEDURE IN DETAIL: After successful administration of spinal  anesthetic, the traction boots for the [facility] bed were placed on both  feet and the patient was placed onto the [facility] bed, boots placed into the leg  holders. The Left hip was then isolated from the perineum with plastic  drapes and prepped and draped in the usual sterile fashion. ASIS and  greater trochanter were marked and a oblique incision was made, starting  at about 1 cm lateral and 2 cm distal to the ASIS and coursing towards  the anterior cortex of the femur. The skin was cut with a 10 blade  through subcutaneous tissue to the level of the fascia overlying the  tensor fascia lata muscle. The fascia was then incised in line with the  incision at the junction of the anterior third and posterior 2/3rd. The  muscle was teased off the fascia and then the interval between the TFL  and the rectus was developed. The Hohmann retractor was then placed at  the top of the femoral neck over the capsule. The vessels overlying the  capsule were cauterized and the fat on top of the capsule was removed.  A Hohmann retractor was then placed anterior underneath the rectus  femoris to give exposure to the entire anterior capsule. A T-shaped   capsulotomy was performed. The edges were tagged and the femoral head  was identified.       Osteophytes are removed off the superior acetabulum.  The femoral neck was then cut in situ with an oscillating saw. Traction  was then applied to the left lower extremity utilizing the [facility]  traction. The femoral head was then removed. Retractors were placed  around the acetabulum and then circumferential removal of the labrum was  performed. Osteophytes were also removed. Reaming starts at 49 mm to  medialize and  Increased in 2 mm increments to 53 mm. We reamed in  approximately 40 degrees of abduction, 20 degrees anteversion. A 54 mm  pinnacle acetabular shell was then impacted in anatomic position under  fluoroscopic guidance with excellent purchase. We did not need to place  any additional dome screws. A 36 mm neutral + 4 marathon liner was then  placed into the acetabular shell.       The femoral lift was then placed along the lateral aspect of the femur  just distal to the vastus ridge. The leg was  externally rotated and capsule  was stripped off the inferior aspect of the femoral neck down to the  level of the lesser trochanter, this was done with electrocautery. The femur was lifted after this was performed. The  leg was then placed in an extended and adducted position essentially delivering the femur. We also removed the capsule superiorly and the piriformis from the piriformis fossa to gain  excellent exposure of the  proximal femur. Rongeur was used to remove some cancellous bone to get  into the lateral portion of the proximal femur for placement of the  initial starter reamer. The starter broaches was placed  the starter broach  and was shown to go down the center of the canal. Broaching  with the Corail system was then performed starting at size 8  coursing  Up to size 6. A size 6 had excellent torsional and rotational  and axial stability. The trial high offset neck was then placed   with a 36 + 1.5 trial head. The hip was then reduced. We confirmed that  the stem was in the canal both on AP and lateral x-rays. It also has excellent sizing. The hip was reduced with outstanding stability through full extension and full external rotation.. AP pelvis was taken and the leg lengths were measured and found to be equal. Hip was then dislocated again and the femoral head and neck removed. The  femoral broach was removed. Size 6 Corail stem with a high offset  neck was then impacted into the femur following native anteversion. Has  excellent purchase in the canal. Excellent torsional and rotational and  axial stability. It is confirmed to be in the canal on AP and lateral  fluoroscopic views. The 36 + 1.5 metal head was placed and the hip  reduced with outstanding stability. Again AP pelvis was taken and it  confirmed that the leg lengths were equal. The wound was then copiously  irrigated with saline solution and the capsule reattached and repaired  with Ethibond suture. 30 ml of .25% Bupivicaine was  injected into the capsule and into the edge of the tensor fascia lata as well as subcutaneous tissue. The fascia overlying the tensor fascia lata was then closed with a running #1 V-Loc. Subcu was closed with interrupted 2-0 Vicryl and subcuticular running 4-0 Monocryl. Incision was cleaned  and dried. Steri-Strips and a bulky sterile dressing applied. Hemovac  drain was hooked to suction and then the patient was awakened and transported to  recovery in stable condition.        Please note that a surgical assistant was a medical necessity for this procedure to perform it in a safe and expeditious manner. Assistant was necessary to provide appropriate retraction of vital neurovascular structures and to prevent femoral fracture and allow for anatomic placement of the prosthesis.  Gaynelle Arabian, M.D.

## 2019-03-19 NOTE — Discharge Instructions (Signed)
°Dr. Frank Aluisio °Total Joint Specialist °[facility] °[address] °[place], [place] [address] °[phone] ° °ANTERIOR APPROACH TOTAL HIP REPLACEMENT POSTOPERATIVE DIRECTIONS ° ° °Hip Rehabilitation, Guidelines Following Surgery  °The results of a hip operation are greatly improved after range of motion and muscle strengthening exercises. Follow all safety measures which are given to protect your hip. If any of these exercises cause increased pain or swelling in your joint, decrease the amount until you are comfortable again. Then slowly increase the exercises. Call your caregiver if you have problems or questions.  ° °HOME CARE INSTRUCTIONS  °• Remove items at home which could result in a fall. This includes throw rugs or furniture in walking pathways.  °· ICE to the affected hip every three hours for 30 minutes at a time and then as needed for pain and swelling.  Continue to use ice on the hip for pain and swelling from surgery. You may notice swelling that will progress down to the foot and ankle.  This is normal after surgery.  Elevate the leg when you are not up walking on it.   °· Continue to use the breathing machine which will help keep your temperature down.  It is common for your temperature to cycle up and down following surgery, especially at night when you are not up moving around and exerting yourself.  The breathing machine keeps your lungs expanded and your temperature down. ° °DIET °You may resume your previous home diet once your are discharged from the hospital. ° °DRESSING / WOUND CARE / SHOWERING °You may change your dressing 3-5 days after surgery.  Then change the dressing every day with sterile gauze.  Please use good hand washing techniques before changing the dressing.  Do not use any lotions or creams on the incision until instructed by your surgeon. °You may start showering once you are discharged home but do not submerge the incision under water. Just pat the  incision dry and apply a dry gauze dressing on daily. °Change the surgical dressing daily and reapply a dry dressing each time. ° °ACTIVITY °Walk with your walker as instructed. °Use walker as long as suggested by your caregivers. °Avoid periods of inactivity such as sitting longer than an hour when not asleep. This helps prevent blood clots.  °You may resume a sexual relationship in one month or when given the OK by your doctor.  °You may return to work once you are cleared by your doctor.  °Do not drive a car for 6 weeks or until released by you surgeon.  °Do not drive while taking narcotics. ° °WEIGHT BEARING °Weight bearing as tolerated with assist device (walker, cane, etc) as directed, use it as long as suggested by your surgeon or therapist, typically at least 4-6 weeks. ° °POSTOPERATIVE CONSTIPATION PROTOCOL °Constipation - defined medically as fewer than three stools per week and severe constipation as less than one stool per week. ° °One of the most common issues patients have following surgery is constipation.  Even if you have a regular bowel pattern at home, your normal regimen is likely to be disrupted due to multiple reasons following surgery.  Combination of anesthesia, postoperative narcotics, change in appetite and fluid intake all can affect your bowels.  In order to avoid complications following surgery, here are some recommendations in order to help you during your recovery period. ° °Colace (docusate) - Pick up an over-the-counter form of Colace or another stool softener and take twice a day   as long as you are requiring postoperative pain medications.  Take with a full glass of water daily.  If you experience loose stools or diarrhea, hold the colace until you stool forms back up.  If your symptoms do not get better within 1 week or if they get worse, check with your doctor. ° °Dulcolax (bisacodyl) - Pick up over-the-counter and take as directed by the product packaging as needed to assist with  the movement of your bowels.  Take with a full glass of water.  Use this product as needed if not relieved by Colace only.  ° °MiraLax (polyethylene glycol) - Pick up over-the-counter to have on hand.  MiraLax is a solution that will increase the amount of water in your bowels to assist with bowel movements.  Take as directed and can mix with a glass of water, juice, soda, coffee, or tea.  Take if you go more than two days without a movement. °Do not use MiraLax more than once per day. Call your doctor if you are still constipated or irregular after using this medication for 7 days in a row. ° °If you continue to have problems with postoperative constipation, please contact the office for further assistance and recommendations.  If you experience "the worst abdominal pain ever" or develop nausea or vomiting, please contact the office immediatly for further recommendations for treatment. ° °ITCHING ° If you experience itching with your medications, try taking only a single pain pill, or even half a pain pill at a time.  You can also use Benadryl over the counter for itching or also to help with sleep.  ° °TED HOSE STOCKINGS °Wear the elastic stockings on both legs for three weeks following surgery during the day but you may remove then at night for sleeping. ° °MEDICATIONS °See your medication summary on the “After Visit Summary” that the nursing staff will review with you prior to discharge.  You may have some home medications which will be placed on hold until you complete the course of blood thinner medication.  It is important for you to complete the blood thinner medication as prescribed by your surgeon.  Continue your approved medications as instructed at time of discharge. ° °PRECAUTIONS °If you experience chest pain or shortness of breath - call 911 immediately for transfer to the hospital emergency department.  °If you develop a fever greater that 101 F, purulent drainage from wound, increased redness or  drainage from wound, foul odor from the wound/dressing, or calf pain - CONTACT YOUR SURGEON.   °                                                °FOLLOW-UP APPOINTMENTS °Make sure you keep all of your appointments after your operation with your surgeon and caregivers. You should call the office at the above phone number and make an appointment for approximately two weeks after the date of your surgery or on the date instructed by your surgeon outlined in the "After Visit Summary". ° °RANGE OF MOTION AND STRENGTHENING EXERCISES  °These exercises are designed to help you keep full movement of your hip joint. Follow your caregiver's or physical therapist's instructions. Perform all exercises about fifteen times, three times per day or as directed. Exercise both hips, even if you have had only one joint replacement. These exercises can be done on   a training (exercise) mat, on the floor, on a table or on a bed. Use whatever works the best and is most comfortable for you. Use music or television while you are exercising so that the exercises are a pleasant break in your day. This will make your life better with the exercises acting as a break in routine you can look forward to.  °• Lying on your back, slowly slide your foot toward your buttocks, raising your knee up off the floor. Then slowly slide your foot back down until your leg is straight again.  °• Lying on your back spread your legs as far apart as you can without causing discomfort.  °• Lying on your side, raise your upper leg and foot straight up from the floor as far as is comfortable. Slowly lower the leg and repeat.  °• Lying on your back, tighten up the muscle in the front of your thigh (quadriceps muscles). You can do this by keeping your leg straight and trying to raise your heel off the floor. This helps strengthen the largest muscle supporting your knee.  °• Lying on your back, tighten up the muscles of your buttocks both with the legs straight and with  the knee bent at a comfortable angle while keeping your heel on the floor.  ° °IF YOU ARE TRANSFERRED TO A SKILLED REHAB FACILITY °If the patient is transferred to a skilled rehab facility following release from the hospital, a list of the current medications will be sent to the facility for the patient to continue.  When discharged from the skilled rehab facility, please have the facility set up the patient's Home Health Physical Therapy prior to being released. Also, the skilled facility will be responsible for providing the patient with their medications at time of release from the facility to include their pain medication, the muscle relaxants, and their blood thinner medication. If the patient is still at the rehab facility at time of the two week follow up appointment, the skilled rehab facility will also need to assist the patient in arranging follow up appointment in our office and any transportation needs. ° °MAKE SURE YOU:  °• Understand these instructions.  °• Get help right away if you are not doing well or get worse.  ° ° °Pick up stool softner and laxative for home use following surgery while on pain medications. °Do not submerge incision under water. °Please use good hand washing techniques while changing dressing each day. °May shower starting three days after surgery. °Please use a clean towel to pat the incision dry following showers. °Continue to use ice for pain and swelling after surgery. °Do not use any lotions or creams on the incision until instructed by your surgeon. ° °

## 2019-03-20 ENCOUNTER — Encounter (HOSPITAL_COMMUNITY): Payer: Self-pay | Admitting: Orthopedic Surgery

## 2019-03-20 DIAGNOSIS — M1612 Unilateral primary osteoarthritis, left hip: Secondary | ICD-10-CM | POA: Diagnosis not present

## 2019-03-20 LAB — CBC
HCT: 37.4 % — ABNORMAL LOW (ref 39.0–52.0)
Hemoglobin: 12.1 g/dL — ABNORMAL LOW (ref 13.0–17.0)
MCH: 33 pg (ref 26.0–34.0)
MCHC: 32.4 g/dL (ref 30.0–36.0)
MCV: 101.9 fL — ABNORMAL HIGH (ref 80.0–100.0)
Platelets: 297 10*3/uL (ref 150–400)
RBC: 3.67 MIL/uL — ABNORMAL LOW (ref 4.22–5.81)
RDW: 14.2 % (ref 11.5–15.5)
WBC: 16.3 10*3/uL — ABNORMAL HIGH (ref 4.0–10.5)
nRBC: 0 % (ref 0.0–0.2)

## 2019-03-20 LAB — BASIC METABOLIC PANEL
Anion gap: 8 (ref 5–15)
BUN: 22 mg/dL (ref 8–23)
CO2: 23 mmol/L (ref 22–32)
Calcium: 8.7 mg/dL — ABNORMAL LOW (ref 8.9–10.3)
Chloride: 106 mmol/L (ref 98–111)
Creatinine, Ser: 1 mg/dL (ref 0.61–1.24)
GFR calc Af Amer: >60 mL/min (ref >60)
GFR calc non Af Amer: >60 mL/min (ref >60)
Glucose, Bld: 177 mg/dL — ABNORMAL HIGH (ref 70–99)
Potassium: 4.1 mmol/L (ref 3.5–5.1)
Sodium: 137 mmol/L (ref 135–145)

## 2019-03-20 MED ORDER — OXYCODONE HCL 5 MG PO TABS: 5.0000 mg | ORAL_TABLET | Freq: Four times a day (QID) | ORAL | 0 refills | Status: DC | PRN

## 2019-03-20 MED ORDER — TRAMADOL HCL 50 MG PO TABS: 50.0000 mg | ORAL_TABLET | Freq: Four times a day (QID) | ORAL | 0 refills | Status: DC | PRN

## 2019-03-20 MED ORDER — METHOCARBAMOL 500 MG PO TABS: 500.0000 mg | ORAL_TABLET | Freq: Four times a day (QID) | ORAL | 0 refills | Status: DC | PRN

## 2019-03-20 MED ORDER — ASPIRIN 325 MG PO TBEC: 325.0000 mg | DELAYED_RELEASE_TABLET | Freq: Two times a day (BID) | ORAL | 0 refills | Status: AC

## 2019-03-20 NOTE — TOC Transition Note (Signed)
Transition of Care [facility]) - CM/SW Discharge Note   Patient Details  Name: Jason Friedman. MRN: [id number] Date of Birth: Feb 25, 1941  [facility] CM/SW Contact:  Lia Hopping, LCSW Phone Number: 03/20/2019, 9:57 AM   Clinical Narrative:    Therapy Plan: HEP DME delivered by [employer].   Final next level of care: Home/Self Care(HEP) Barriers to Discharge: No Barriers Identified   Patient Goals and CMS Choice     Choice offered to / list presented to : NA  Discharge Placement                       Discharge Plan and Services                DME Arranged: 3-N-1, Walker rolling   Date DME Agency Contacted: 03/20/19 Time DME Agency Contacted: 0900 Representative spoke with at DME Agency: [place] [facility] Interventions     Readmission Risk Interventions No flowsheet data found.

## 2019-03-20 NOTE — Progress Notes (Signed)
Physical Therapy Treatment Patient Details Name: Jason Friedman. MRN: [id number] DOB: Jul 20, 1940 Today's Date: 03/20/2019    History of Present Illness Patient is 78 y.o. male s/p Lt THA anterior approach on 03/19/19 with PMH significant for OA, HTN, COPD, multiple C-spine surgeries (fusions C4-6).    PT Comments    Progressing well with mobility. Reviewed exercises, gait training, and stair training. Issued HEP for pt to perform 2x/day until surgeon instructs him otherwise. All education completed. Okay to d/c from PT standpoint.    Follow Up Recommendations  Follow surgeon's recommendation for DC plan and follow-up therapies     Equipment Recommendations  Rolling walker with 5" wheels    Recommendations for Other Services       Precautions / Restrictions Precautions Precautions: Fall Restrictions Weight Bearing Restrictions: No Other Position/Activity Restrictions: WBAT    Mobility  Bed Mobility Overal bed mobility: Modified Independent Bed Mobility: Supine to Sit              Transfers Overall transfer level: Needs assistance Equipment used: Rolling walker (2 wheeled) Transfers: Sit to/from Stand Sit to Stand: Supervision         General transfer comment: for safety  Ambulation/Gait Ambulation/Gait assistance: Supervision Gait Distance (Feet): 200 Feet Assistive device: Rolling walker (2 wheeled) Gait Pattern/deviations: Step-through pattern;Decreased stride length     General Gait Details: for safety   Stairs Stairs: Yes Stairs assistance: Min guard Stair Management: Step to pattern;Forwards Number of Stairs: 2 General stair comments: Pt stated he will use his door handle and the wall to brace himself on. VCs safety, sequence. Close guard for safety.   Wheelchair Mobility    Modified Rankin (Stroke Patients Only)       Balance Overall balance assessment: Needs assistance         Standing balance support: Bilateral upper  extremity supported Standing balance-Leahy Scale: Fair                              Cognition Arousal/Alertness: Awake/alert Behavior During Therapy: WFL for tasks assessed/performed Overall Cognitive Status: Within Functional Limits for tasks assessed                                        Exercises Total Joint Exercises Ankle Circles/Pumps: AROM;Both;10 reps;Supine Quad Sets: AROM;Both;10 reps;Supine Heel Slides: Left;10 reps;AROM;Supine Hip ABduction/ADduction: AROM;Left;10 reps;Supine;Standing Knee Flexion: AROM;Left;10 reps;Standing Marching in Standing: AROM;Right;Left;10 reps;Standing General Exercises - Lower Extremity Heel Raises: AROM;Both;10 reps;Standing    General Comments        Pertinent Vitals/Pain Pain Assessment: 0-10 Pain Score: 6  Pain Location: L hip/thigh Pain Descriptors / Indicators: Discomfort;Sore Pain Intervention(s): Monitored during session;Repositioned;Ice applied    Home Living                      Prior Function            PT Goals (current goals can now be found in the care plan section) Progress towards PT goals: Progressing toward goals    Frequency    7X/week      PT Plan Current plan remains appropriate    Co-evaluation              AM-PAC PT "6 Clicks" Mobility   Outcome Measure  Help needed turning from your back to your side  while in a flat bed without using bedrails?: None Help needed moving from lying on your back to sitting on the side of a flat bed without using bedrails?: None Help needed moving to and from a bed to a chair (including a wheelchair)?: A Little Help needed standing up from a chair using your arms (e.g., wheelchair or bedside chair)?: A Little Help needed to walk in hospital room?: A Little Help needed climbing 3-5 steps with a railing? : A Little 6 Click Score: 20    End of Session   Activity Tolerance: Patient tolerated treatment well Patient  left: in chair;with call bell/phone within reach;with family/visitor present   PT Visit Diagnosis: Muscle weakness (generalized) (M62.81);Difficulty in walking, not elsewhere classified (R26.2)     Time: 1607-3710 PT Time Calculation (min) (ACUTE ONLY): 22 min  Charges:  $Gait Training: 8-22 mins                        Weston Anna, PT Acute Rehabilitation Services Pager: [phone] Office: [phone]

## 2019-03-20 NOTE — Plan of Care (Signed)
Pt ready for DC home 

## 2019-03-20 NOTE — Progress Notes (Signed)
Subjective: 1 Day Post-Op Procedure(s) (LRB): TOTAL HIP ARTHROPLASTY ANTERIOR APPROACH (Left) Patient reports pain as mild.   Patient seen in rounds by Dr. Lequita Halt. Patient is well, and has had no acute complaints or problems. States he is ready to go home. Denies chest pain or SOB. No issues overnight. Foley catheter to be removed this AM. Did well ambulating with therapy yesterday, will continue with additional sessions today.  Objective: Vital signs in last 24 hours: Temp:  [97.3 F (36.3 C)-98.3 F (36.8 C)] 98.2 F (36.8 C) (11/24 0539) Pulse Rate:  [73-85] 83 (11/24 0539) Resp:  [8-18] 18 (11/24 0539) BP: (119-143)/(67-80) 119/71 (11/24 0539) SpO2:  [96 %-100 %] 98 % (11/24 0539) Weight:  [78 kg] 78 kg (11/23 0951)  Intake/Output from previous day:  Intake/Output Summary (Last 24 hours) at 03/20/2019 0710 Last data filed at 03/20/2019 0600 Gross per 24 hour  Intake 3983.59 ml  Output 4045 ml  Net -61.41 ml    Labs: Recent Labs    03/20/19 0302  HGB 12.1*   Recent Labs    03/20/19 0302  WBC 16.3*  RBC 3.67*  HCT 37.4*  PLT 297   Recent Labs    03/20/19 0302  NA 137  K 4.1  CL 106  CO2 23  BUN 22  CREATININE 1.00  GLUCOSE 177*  CALCIUM 8.7*   Exam: General - Patient is Alert and Oriented Extremity - Neurologically intact Neurovascular intact Sensation intact distally Dorsiflexion/Plantar flexion intact Dressing - dressing C/D/I Motor Function - intact, moving foot and toes well on exam.   Past Medical History:  Diagnosis Date  . Anemia   . Arthritis   . Cervical spinal stenosis   . Cervical spine fracture (HCC) 2  . Cervical vertebral fracture (HCC) 25 yrs ago   C 5   . Chronically elevated hemidiaphragm    Right  . 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 COPD (chronic obstructive pulmonary disease) (HCC)    mild copd per lov note dr Meredeth Ide 04-16-11 on chart  . Difficult intubation     difficult d/t anterior larynx, successful intubation using Glidescope 04/07/17  . Fracture of cervical vertebrae, multiple (HCC)  in high school   2 cervical vertebrae fx, 3 dislocated cervical vertebrae  . Grade I diastolic dysfunction    noted on ECHO  . Heart abnormality    heart anatomicall rotated, causes abnormal ekg  . Hypertension    hx on, none current  . Hypogonadism in male   . Hypothyroidism   . Impaired memory    x 2 yrs ago after neck injury  . Insomnia   . Paralysis, diaphragm    Left  . PONV (postoperative nausea and vomiting)    likes scopolamine patch, vertigo ,pt. reports that anesthesia "has had problem finding my airway"  . Prostate irregularity    nonfuctional prostate, on testerone gel bid  . Sigmoid diverticulitis   . Sleep apnea    does not use due to weight loss, does not need now, not able to use CPAP, last sleep study- 2018    Assessment/Plan: 1 Day Post-Op Procedure(s) (LRB): TOTAL HIP ARTHROPLASTY ANTERIOR APPROACH (Left) Principal Problem:   OA (osteoarthritis) of hip Active Problems:   Osteoarthritis of left hip  Estimated body mass index is 25.4 kg/m as calculated from the following:   Height as of this encounter: 5\' 9"  (1.753 m).   Weight as of this  encounter: 78 kg. Advance diet Up with therapy D/C IV fluids  DVT Prophylaxis - Aspirin Weight bearing as tolerated. D/C O2 and pulse ox and try on room air. Hemovac pulled without difficulty, will continue therapy.  Plan is to go Home with HEP after hospital stay. Plan for discharge once meeting goals with therapy. Follow-up in the office in 2 weeks.   Theresa Duty, PA-C Orthopedic Surgery 03/20/2019, 7:10 AM

## 2019-03-21 NOTE — Discharge Summary (Signed)
Physician Discharge Summary   Patient ID: Jason Friedman. MRN: [id number] DOB/AGE: 76/06/1940 76 y.o.  Admit date: 03/19/2019 Discharge date: 03/20/2019  Primary Diagnosis:  Osteoarthritis of the Left  hip.  Admission Diagnoses:  Past Medical History:  Diagnosis Date  . Anemia   . Arthritis   . Cervical spinal stenosis   . Cervical spine fracture (HCC) 2  . Cervical vertebral fracture (HCC) 25 yrs ago   C 5   . Chronically elevated hemidiaphragm    Right  . 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 COPD (chronic obstructive pulmonary disease) (HCC)    mild copd per lov note dr Meredeth Ide 04-16-11 on chart  . Difficult intubation    difficult d/t anterior larynx, successful intubation using Glidescope 04/07/17  . Fracture of cervical vertebrae, multiple (HCC)  in high school   2 cervical vertebrae fx, 3 dislocated cervical vertebrae  . Grade I diastolic dysfunction    noted on ECHO  . Heart abnormality    heart anatomicall rotated, causes abnormal ekg  . Hypertension    hx on, none current  . Hypogonadism in male   . Hypothyroidism   . Impaired memory    x 2 yrs ago after neck injury  . Insomnia   . Paralysis, diaphragm    Left  . PONV (postoperative nausea and vomiting)    likes scopolamine patch, vertigo ,pt. reports that anesthesia "has had problem finding my airway"  . Prostate irregularity    nonfuctional prostate, on testerone gel bid  . Sigmoid diverticulitis   . Sleep apnea    does not use due to weight loss, does not need now, not able to use CPAP, last sleep study- 2018   Discharge Diagnoses:   Principal Problem:   OA (osteoarthritis) of hip Active Problems:   Osteoarthritis of left hip  Estimated body mass index is 25.4 kg/m as calculated from the following:   Height as of this encounter:  (1.753 m).   Weight as of this encounter: 78 kg.  Procedure:  Procedure(s) (LRB): TOTAL HIP  ARTHROPLASTY ANTERIOR APPROACH (Left)   Consults: None  HPI: Jason Friedman. is a 78 y.o. male who has advanced end-  stage arthritis of their Left  hip with progressively worsening pain and  dysfunction.The patient has failed nonoperative management and presents for  total hip arthroplasty.   Laboratory Data: Admission on 03/19/2019, Discharged on 03/20/2019  Component Date Value Ref Range Status  . WBC 03/20/2019 16.3* 4.0 - 10.5 K/uL Final  . RBC 03/20/2019 3.67* 4.22 - 5.81 MIL/uL Final  . Hemoglobin 03/20/2019 12.1* 13.0 - 17.0 g/dL Final  . HCT 40/98/1191 37.4* 39.0 - 52.0 % Final  . MCV 03/20/2019 101.9* 80.0 - 100.0 fL Final  . MCH 03/20/2019 33.0  26.0 - 34.0 pg Final  . MCHC 03/20/2019 32.4  30.0 - 36.0 g/dL Final  . RDW 47/82/9562 14.2  11.5 - 15.5 % Final  . Platelets 03/20/2019 297  150 - 400 K/uL Final  . nRBC 03/20/2019 0.0  0.0 - 0.2 % Final   Performed at [facility], [address] [place], [place] [address]  . Sodium 03/20/2019 137  135 - 145 mmol/L Final  . Potassium 03/20/2019 4.1  3.5 - 5.1 mmol/L Final  . Chloride 03/20/2019 106  98 - 111 mmol/L Final  . CO2 03/20/2019 23  22 - 32 mmol/L Final  . Glucose,  Bld 03/20/2019 177* 70 - 99 mg/dL Final  . BUN 40/98/119111/24/2020 22  8 - 23 mg/dL Final  . Creatinine, Ser 03/20/2019 1.00  0.61 - 1.24 mg/dL Final  . Calcium 47/82/956211/24/2020 8.7* 8.9 - 10.3 mg/dL Final  . GFR calc non Af Amer 03/20/2019 >60  >60 mL/min Final  . GFR calc Af Amer 03/20/2019 >60  >60 mL/min Final  . Anion gap 03/20/2019 8  5 - 15 Final   Performed at [facility], [address] [place], [place] [address]  Hospital Outpatient Visit on 03/16/2019  Component Date Value Ref Range Status  . aPTT 03/16/2019 32  24 - 36 seconds Final   Performed at [facility], [address] [place], [place] [address]  . WBC 03/16/2019 7.3  4.0 - 10.5 K/uL Final  . RBC 03/16/2019 4.77  4.22 - 5.81 MIL/uL  Final  . Hemoglobin 03/16/2019 15.9  13.0 - 17.0 g/dL Final  . HCT 96/29/528411/20/2020 49.2  39.0 - 52.0 % Final  . MCV 03/16/2019 103.1* 80.0 - 100.0 fL Final  . MCH 03/16/2019 33.3  26.0 - 34.0 pg Final  . MCHC 03/16/2019 32.3  30.0 - 36.0 g/dL Final  . RDW 13/24/401011/20/2020 14.7  11.5 - 15.5 % Final  . Platelets 03/16/2019 319  150 - 400 K/uL Final  . nRBC 03/16/2019 0.0  0.0 - 0.2 % Final  . Neutrophils Relative % 03/16/2019 67  % Final  . Neutro Abs 03/16/2019 4.9  1.7 - 7.7 K/uL Final  . Lymphocytes Relative 03/16/2019 23  % Final  . Lymphs Abs 03/16/2019 1.7  0.7 - 4.0 K/uL Final  . Monocytes Relative 03/16/2019 7  % Final  . Monocytes Absolute 03/16/2019 0.5  0.1 - 1.0 K/uL Final  . Eosinophils Relative 03/16/2019 2  % Final  . Eosinophils Absolute 03/16/2019 0.2  0.0 - 0.5 K/uL Final  . Basophils Relative 03/16/2019 1  % Final  . Basophils Absolute 03/16/2019 0.1  0.0 - 0.1 K/uL Final  . Immature Granulocytes 03/16/2019 0  % Final  . Abs Immature Granulocytes 03/16/2019 0.02  0.00 - 0.07 K/uL Final   Performed at [facility], [address] [place], [place] [address]  . Sodium 03/16/2019 143  135 - 145 mmol/L Final  . Potassium 03/16/2019 4.8  3.5 - 5.1 mmol/L Final  . Chloride 03/16/2019 106  98 - 111 mmol/L Final  . CO2 03/16/2019 29  22 - 32 mmol/L Final  . Glucose, Bld 03/16/2019 106* 70 - 99 mg/dL Final  . BUN 66/44/034711/20/2020 21  8 - 23 mg/dL Final  . Creatinine, Ser 03/16/2019 1.07  0.61 - 1.24 mg/dL Final  . Calcium 42/59/563811/20/2020 9.3  8.9 - 10.3 mg/dL Final  . Total Protein 03/16/2019 7.4  6.5 - 8.1 g/dL Final  . Albumin 75/64/332911/20/2020 4.6  3.5 - 5.0 g/dL Final  . AST 51/88/416611/20/2020 22  15 - 41 U/L Final  . ALT 03/16/2019 15  0 - 44 U/L Final  . Alkaline Phosphatase 03/16/2019 53  38 - 126 U/L Final  . Total Bilirubin 03/16/2019 1.3* 0.3 - 1.2 mg/dL Final  . GFR calc non Af Amer 03/16/2019 >60  >60 mL/min Final  . GFR calc Af Amer 03/16/2019 >60  >60 mL/min Final  . Anion  gap 03/16/2019 8  5 - 15 Final   Performed at [facility], [address] [place], [place] [address]  . Prothrombin Time 03/16/2019 13.2  11.4 - 15.2 seconds Final  . INR 03/16/2019 1.0  0.8 - 1.2 Final   Comment: (NOTE) INR goal varies based on device and disease states. Performed at [facility], [address], [place], [place] [address]   . ABO/RH(D) 03/16/2019 A POS   Final  . Antibody Screen 03/16/2019 NEG   Final  . Sample Expiration 03/16/2019 03/22/2019,2359   Final  . Extend sample reason 03/16/2019    Final                   Value:NO TRANSFUSIONS OR PREGNANCY IN THE PAST 3 MONTHS Performed at [facility], [address], [place], [place] [address]   . MRSA, PCR 03/16/2019 NEGATIVE  NEGATIVE Final  . Staphylococcus aureus 03/16/2019 NEGATIVE  NEGATIVE Final   Comment: (NOTE) The Xpert SA Assay (FDA approved for NASAL specimens in patients 69 years of age and older), is one component of a comprehensive surveillance program. It is not intended to diagnose infection nor to guide or monitor treatment. Performed at [facility], [address], [place], [place] [address]   Hospital Outpatient Visit on 03/15/2019  Component Date Value Ref Range Status  . SARS-CoV-2, NAA 03/15/2019 NOT DETECTED  NOT DETECTED Final   Comment: (NOTE) This nucleic acid amplification test was developed and its performance characteristics determined by [employer]. Nucleic acid amplification tests include PCR and TMA. This test has not been FDA cleared or approved. This test has been authorized by FDA under an Emergency Use Authorization (EUA). This test is only authorized for the duration of time the declaration that circumstances exist justifying the authorization of the emergency use of in vitro diagnostic tests for detection of SARS-CoV-2 virus and/or diagnosis of COVID-19 infection under section 564(b)(1)  of the Act, 21 U.S.C. 295MWU-1(L) (1), unless the authorization is terminated or revoked sooner. When diagnostic testing is negative, the possibility of a false negative result should be considered in the context of a patient's recent exposures and the presence of clinical signs and symptoms consistent with COVID-19. An individual without symptoms of COVID- 19 and who is not shedding SARS-CoV-2 vi                          rus would expect to have a negative (not detected) result in this assay. Performed At: [facility] [address] [place], [place] [id number] Jolene Schimke MD ZD:[id number]   . Coronavirus Source 03/15/2019 NASOPHARYNGEAL   Final   Performed at [facility], [address], [place], [place] [address]     X-Rays:Dg Pelvis Portable  Result Date: 03/19/2019 CLINICAL DATA:  Left hip replacement EXAM: PORTABLE PELVIS 1-2 VIEWS COMPARISON:  None FINDINGS: Post total left hip replacement. Long stem right hip replacement is again noted. IMPRESSION: Post total left hip replacement. Electronically Signed   By: Guadlupe Spanish M.D.   On: 03/19/2019 14:14   Dg C-arm 1-60 Min-no Report  Result Date: 03/19/2019 Fluoroscopy was utilized by the requesting physician.  No radiographic interpretation.   Dg Hip Operative Unilat W Or W/o Pelvis Left  Result Date: 03/19/2019 CLINICAL DATA:  Left hip replacement. EXAM: OPERATIVE left HIP (WITH PELVIS IF PERFORMED) 4 VIEWS TECHNIQUE: Fluoroscopic spot image(s) were submitted for interpretation post-operatively. FLUOROSCOPY TIME:  11 seconds. COMPARISON:  August 23, 2011. FINDINGS: Four intraoperative fluoroscopic images were obtained of the left hip. These demonstrate the left femoral and acetabular components to  be well situated. IMPRESSION: Status post left total hip arthroplasty. See operative report for further details. Electronically Signed   By: Lupita Raider M.D.   On: 03/19/2019 13:44    EKG: Orders placed or  performed during the hospital encounter of 03/16/19  . EKG  . EKG     Hospital Course: Jason Friedman. is a 78 y.o. who was admitted to [facility]. They were brought to the operating room on 03/19/2019 and underwent Procedure(s): TOTAL HIP ARTHROPLASTY ANTERIOR APPROACH.  Patient tolerated the procedure well and was later transferred to the recovery room and then to the orthopaedic floor for postoperative care. They were given PO and IV analgesics for pain control following their surgery. They were given 24 hours of postoperative antibiotics of  Anti-infectives (From admission, onward)   Start     Dose/Rate Route Frequency Ordered Stop   03/19/19 2300  vancomycin (VANCOCIN) IVPB 1000 mg/200 mL premix     1,000 mg 200 mL/hr over 60 Minutes Intravenous Every 12 hours 03/19/19 1429 03/20/19 0017   03/19/19 0930  vancomycin (VANCOCIN) IVPB 1000 mg/200 mL premix     1,000 mg 200 mL/hr over 60 Minutes Intravenous On call to O.R. 03/19/19 8546 03/19/19 1154     and started on DVT prophylaxis in the form of Aspirin.   PT and OT were ordered for total joint protocol. Discharge planning consulted to help with postop disposition and equipment needs.  Patient had a good night on the evening of surgery. They started to get up OOB with therapy on POD #0. Pt was seen during rounds and was ready to go home pending progress with therapy. Hemovac drain was pulled without difficulty. He worked with therapy on POD #1 and was meeting his goals. Pt was discharged to home later that day in stable condition.  Diet: Regular diet Activity: WBAT Follow-up: in 2 weeks Disposition: Home Discharged Condition: good   Discharge Instructions    Call MD / Call 911   Complete by: As directed    If you experience chest pain or shortness of breath, CALL 911 and be transported to the hospital emergency room.  If you develope a fever above 101 F, pus (white drainage) or increased drainage or redness at the  wound, or calf pain, call your surgeon's office.   Change dressing   Complete by: As directed    You may change your dressing on Wednesday, then change the dressing daily with sterile 4 x 4 inch gauze dressing and paper tape.   Constipation Prevention   Complete by: As directed    Drink plenty of fluids.  Prune juice may be helpful.  You may use a stool softener, such as Colace (over the counter) 100 mg twice a day.  Use MiraLax (over the counter) for constipation as needed.   Diet - low sodium heart healthy   Complete by: As directed    Discharge instructions   Complete by: As directed    Dr. Ollen Gross Total Joint Specialist [facility] [address] [place], [place] [address] [phone]  ANTERIOR APPROACH TOTAL HIP REPLACEMENT POSTOPERATIVE DIRECTIONS   Hip Rehabilitation, Guidelines Following Surgery  The results of a hip operation are greatly improved after range of motion and muscle strengthening exercises. Follow all safety measures which are given to protect your hip. If any of these exercises cause increased pain or swelling in your joint, decrease the amount until you are comfortable again. Then slowly  increase the exercises. Call your caregiver if you have problems or questions.   HOME CARE INSTRUCTIONS  Remove items at home which could result in a fall. This includes throw rugs or furniture in walking pathways.  ICE to the affected hip every three hours for 30 minutes at a time and then as needed for pain and swelling.  Continue to use ice on the hip for pain and swelling from surgery. You may notice swelling that will progress down to the foot and ankle.  This is normal after surgery.  Elevate the leg when you are not up walking on it.   Continue to use the breathing machine which will help keep your temperature down.  It is common for your temperature to cycle up and down following surgery, especially at night when you are not up moving around and exerting  yourself.  The breathing machine keeps your lungs expanded and your temperature down.  DIET You may resume your previous home diet once your are discharged from the hospital.  DRESSING / WOUND CARE / SHOWERING You may change your dressing 3-5 days after surgery.  Then change the dressing every day with sterile gauze.  Please use good hand washing techniques before changing the dressing.  Do not use any lotions or creams on the incision until instructed by your surgeon. You may start showering once you are discharged home but do not submerge the incision under water. Just pat the incision dry and apply a dry gauze dressing on daily. Change the surgical dressing daily and reapply a dry dressing each time.  ACTIVITY Walk with your walker as instructed. Use walker as long as suggested by your caregivers. Avoid periods of inactivity such as sitting longer than an hour when not asleep. This helps prevent blood clots.  You may resume a sexual relationship in one month or when given the OK by your doctor.  You may return to work once you are cleared by your doctor.  Do not drive a car for 6 weeks or until released by you surgeon.  Do not drive while taking narcotics.  WEIGHT BEARING Weight bearing as tolerated with assist device (walker, cane, etc) as directed, use it as long as suggested by your surgeon or therapist, typically at least 4-6 weeks.  POSTOPERATIVE CONSTIPATION PROTOCOL Constipation - defined medically as fewer than three stools per week and severe constipation as less than one stool per week.  One of the most common issues patients have following surgery is constipation.  Even if you have a regular bowel pattern at home, your normal regimen is likely to be disrupted due to multiple reasons following surgery.  Combination of anesthesia, postoperative narcotics, change in appetite and fluid intake all can affect your bowels.  In order to avoid complications following surgery, here are  some recommendations in order to help you during your recovery period.  Colace (docusate) - Pick up an over-the-counter form of Colace or another stool softener and take twice a day as long as you are requiring postoperative pain medications.  Take with a full glass of water daily.  If you experience loose stools or diarrhea, hold the colace until you stool forms back up.  If your symptoms do not get better within 1 week or if they get worse, check with your doctor.  Dulcolax (bisacodyl) - Pick up over-the-counter and take as directed by the product packaging as needed to assist with the movement of your bowels.  Take with a full glass of  water.  Use this product as needed if not relieved by Colace only.   MiraLax (polyethylene glycol) - Pick up over-the-counter to have on hand.  MiraLax is a solution that will increase the amount of water in your bowels to assist with bowel movements.  Take as directed and can mix with a glass of water, juice, soda, coffee, or tea.  Take if you go more than two days without a movement. Do not use MiraLax more than once per day. Call your doctor if you are still constipated or irregular after using this medication for 7 days in a row.  If you continue to have problems with postoperative constipation, please contact the office for further assistance and recommendations.  If you experience "the worst abdominal pain ever" or develop nausea or vomiting, please contact the office immediatly for further recommendations for treatment.  ITCHING  If you experience itching with your medications, try taking only a single pain pill, or even half a pain pill at a time.  You can also use Benadryl over the counter for itching or also to help with sleep.   TED HOSE STOCKINGS Wear the elastic stockings on both legs for three weeks following surgery during the day but you may remove then at night for sleeping.  MEDICATIONS See your medication summary on the "After Visit Summary"  that the nursing staff will review with you prior to discharge.  You may have some home medications which will be placed on hold until you complete the course of blood thinner medication.  It is important for you to complete the blood thinner medication as prescribed by your surgeon.  Continue your approved medications as instructed at time of discharge.  PRECAUTIONS If you experience chest pain or shortness of breath - call 911 immediately for transfer to the hospital emergency department.  If you develop a fever greater that 101 F, purulent drainage from wound, increased redness or drainage from wound, foul odor from the wound/dressing, or calf pain - CONTACT YOUR SURGEON.                                                   FOLLOW-UP APPOINTMENTS Make sure you keep all of your appointments after your operation with your surgeon and caregivers. You should call the office at the above phone number and make an appointment for approximately two weeks after the date of your surgery or on the date instructed by your surgeon outlined in the "After Visit Summary".  RANGE OF MOTION AND STRENGTHENING EXERCISES  These exercises are designed to help you keep full movement of your hip joint. Follow your caregiver's or physical therapist's instructions. Perform all exercises about fifteen times, three times per day or as directed. Exercise both hips, even if you have had only one joint replacement. These exercises can be done on a training (exercise) mat, on the floor, on a table or on a bed. Use whatever works the best and is most comfortable for you. Use music or television while you are exercising so that the exercises are a pleasant break in your day. This will make your life better with the exercises acting as a break in routine you can look forward to.  Lying on your back, slowly slide your foot toward your buttocks, raising your knee up off the floor. Then slowly slide your foot  back down until your leg is  straight again.  Lying on your back spread your legs as far apart as you can without causing discomfort.  Lying on your side, raise your upper leg and foot straight up from the floor as far as is comfortable. Slowly lower the leg and repeat.  Lying on your back, tighten up the muscle in the front of your thigh (quadriceps muscles). You can do this by keeping your leg straight and trying to raise your heel off the floor. This helps strengthen the largest muscle supporting your knee.  Lying on your back, tighten up the muscles of your buttocks both with the legs straight and with the knee bent at a comfortable angle while keeping your heel on the floor.   IF YOU ARE TRANSFERRED TO A SKILLED REHAB FACILITY If the patient is transferred to a skilled rehab facility following release from the hospital, a list of the current medications will be sent to the facility for the patient to continue.  When discharged from the skilled rehab facility, please have the facility set up the patient's Home Health Physical Therapy prior to being released. Also, the skilled facility will be responsible for providing the patient with their medications at time of release from the facility to include their pain medication, the muscle relaxants, and their blood thinner medication. If the patient is still at the rehab facility at time of the two week follow up appointment, the skilled rehab facility will also need to assist the patient in arranging follow up appointment in our office and any transportation needs.  MAKE SURE YOU:  Understand these instructions.  Get help right away if you are not doing well or get worse.    Pick up stool softner and laxative for home use following surgery while on pain medications. Do not submerge incision under water. Please use good hand washing techniques while changing dressing each day. May shower starting three days after surgery. Please use a clean towel to pat the incision dry  following showers. Continue to use ice for pain and swelling after surgery. Do not use any lotions or creams on the incision until instructed by your surgeon.   Do not sit on low chairs, stoools or toilet seats, as it may be difficult to get up from low surfaces   Complete by: As directed    Driving restrictions   Complete by: As directed    No driving for two weeks   TED hose   Complete by: As directed    Use stockings (TED hose) for three weeks on both leg(s).  You may remove them at night for sleeping.   Weight bearing as tolerated   Complete by: As directed      Allergies as of 03/20/2019      Reactions   Duloxetine Other (See Comments)   Altered personality - didn't eat, pain all over   Cefuroxime Axetil [ceftin] Other (See Comments)   Joint pain   Gabapentin Other (See Comments)   Visual disturbance, hair loss   Shellfish Allergy Swelling   SWELLING REACTION UNSPECIFIED    Sulfa Antibiotics Other (See Comments)   UNSPECIFIED REACTION    Latex Other (See Comments)   Skin irritation       Medication List    STOP taking these medications   HYDROcodone-acetaminophen 5-325 MG tablet Commonly known as: NORCO/VICODIN   naproxen 500 MG tablet Commonly known as: NAPROSYN     TAKE these medications   amLODipine 5  MG tablet Commonly known as: NORVASC Take 5 mg by mouth every evening.   amoxicillin 500 MG tablet Commonly known as: AMOXIL Take 2,000 mg by mouth See admin instructions. Take 4 capsules (2000 mg) by mouth 1 hour prior to dental appointments.   AndroGel Pump 20.25 MG/ACT (1.62%) Gel Generic drug: Testosterone Apply 3 Pump topically 2 (two) times daily.   aspirin 325 MG EC tablet Take 1 tablet (325 mg total) by mouth 2 (two) times daily for 20 days. Then take one 81 mg aspirin once a day for three weeks. Then discontinue aspirin.   b complex vitamins tablet Take 1 tablet by mouth daily.   cromolyn 4 % ophthalmic solution Commonly known as: OPTICROM  Place 1 drop into both eyes 2 (two) times daily.   doxycycline 100 MG capsule Commonly known as: VIBRAMYCIN Take 100 mg by mouth 2 (two) times daily as needed (for tick bites). Take for 7 days   fluticasone 50 MCG/ACT nasal spray Commonly known as: FLONASE Place 2 sprays into both nostrils daily.   gabapentin 100 MG capsule Commonly known as: NEURONTIN Take 100 mg by mouth 3 (three) times daily.   levothyroxine 137 MCG tablet Commonly known as: SYNTHROID Take 137 mcg by mouth daily before breakfast.   losartan 50 MG tablet Commonly known as: COZAAR Take 50 mg by mouth every evening.   meclizine 25 MG tablet Commonly known as: ANTIVERT Take 25 mg by mouth 2 (two) times daily.   methocarbamol 500 MG tablet Commonly known as: ROBAXIN Take 1 tablet (500 mg total) by mouth every 6 (six) hours as needed for muscle spasms.   oxyCODONE 5 MG immediate release tablet Commonly known as: Oxy IR/ROXICODONE Take 1-2 tablets (5-10 mg total) by mouth every 6 (six) hours as needed for moderate pain (pain score 4-6).   Simbrinza 1-0.2 % Susp Generic drug: Brinzolamide-Brimonidine Place 1 drop into both eyes 2 (two) times daily.   tiZANidine 4 MG tablet Commonly known as: ZANAFLEX Take 4 mg by mouth at bedtime.   traMADol 50 MG tablet Commonly known as: ULTRAM Take 1 tablet (50 mg total) by mouth every 6 (six) hours as needed for moderate pain.   vitamin C 500 MG tablet Commonly known as: ASCORBIC ACID Take 500 mg by mouth daily.   Vitamin D3 50 MCG (2000 UT) Tabs Take 2,000 Units by mouth daily.            Discharge Care Instructions  (From admission, onward)         Start     Ordered   03/20/19 0000  Weight bearing as tolerated     03/20/19 0714   03/20/19 0000  Change dressing    Comments: You may change your dressing on Wednesday, then change the dressing daily with sterile 4 x 4 inch gauze dressing and paper tape.   03/20/19 1610         Follow-up  Information    Ollen Gross, MD. Schedule an appointment as soon as possible for a visit on 04/03/2019.   Specialty: Orthopedic Surgery Contact information: [address] [place] [address] [phone]           Signed: Dennie Bible, PA-C Orthopedic Surgery 03/21/2019, 8:15 AM

## 2019-05-02 ENCOUNTER — Ambulatory Visit (INDEPENDENT_AMBULATORY_CARE_PROVIDER_SITE_OTHER): Payer: Medicare PPO | Admitting: Orthopaedic Surgery

## 2019-05-02 ENCOUNTER — Encounter: Payer: Self-pay | Admitting: Orthopaedic Surgery

## 2019-05-02 ENCOUNTER — Other Ambulatory Visit: Payer: Self-pay

## 2019-05-02 ENCOUNTER — Ambulatory Visit (INDEPENDENT_AMBULATORY_CARE_PROVIDER_SITE_OTHER): Payer: Medicare PPO

## 2019-05-02 VITALS — Ht 69.0 in | Wt 172.0 lb

## 2019-05-02 DIAGNOSIS — M542 Cervicalgia: Secondary | ICD-10-CM

## 2019-05-02 NOTE — Progress Notes (Signed)
Office Visit Note   Patient: Jason Friedman.           Date of Birth: 1940-12-03           MRN: [id number] Visit Date: 05/02/2019              Requested by: Marden Noble, MD [address] [place],  [place] [address] PCP: Marden Noble, MD   Assessment & Plan: Visit Diagnoses:  1. Neck pain     Plan: We reviewed x-rays which shows neck stable wires intact anterior plate screws grafts are in satisfactory position no abnormal motion.  He is fused anteriorly and posteriorly over the area where he had myelopathy to protect the cord.  Incisions are well-healed.  We discussed that grinding in his neck is related to C1-C2 level and is normal.  Office follow-up as needed.  Follow-Up Instructions: No follow-ups on file.   Orders:  Orders Placed This Encounter  Procedures  . XR Cervical Spine 2 or 3 views   No orders of the defined types were placed in this encounter.     Procedures: No procedures performed   Clinical Data: No additional findings.   Subjective: Chief Complaint  Patient presents with  . Neck - Pain    HPI 79 year old male returns he states he was active did some turning and lifting and suddenly noticed a pop in his neck he was concerned something happened.  When he rotates his neck he has noted some grinding at the occipital cervical junction.  He has had some chronic slight numbness in his right hand.  He still has some problems with his balance from his myelopathy longstanding.  He has noticed some prominence of C7 spinous process with palpation of his neck.  Previous posterior C4-C6 fusion 05/08/2022 pseudoarthrosis after anterior procedure for severe myelopathy and cervical stenosis.  Patient is noted since the cervical procedure 2019 and 2020 he has improved usage of his arms and hands and has had no progression of lower extremity weakness some improvement in walking but some residual problems from his myelopathy.  Review of Systems patient  had left total of arthroplasty by Dr. Lequita Halt November 2020.  Opposite right hip also doing well.  He is amatory without a cane.  On chronic Norco 5/325   #120 tablets monthly.  Past history of old neck ligamentous injury age 48.  Severe cord compression and myelopathy 2019.   Objective: Vital Signs: Ht 5\' 9"  (1.753 m)   Wt 172 lb (78 kg)   BMI 25.40 kg/m   Physical Exam Constitutional:      Appearance: He is well-developed.  HENT:     Head: Normocephalic and atraumatic.  Eyes:     Pupils: Pupils are equal, round, and reactive to light.  Neck:     Thyroid: No thyromegaly.     Trachea: No tracheal deviation.  Cardiovascular:     Rate and Rhythm: Normal rate.  Pulmonary:     Effort: Pulmonary effort is normal.     Breath sounds: No wheezing.  Abdominal:     General: Bowel sounds are normal.     Palpations: Abdomen is soft.  Skin:    General: Skin is warm and dry.     Capillary Refill: Capillary refill takes less than 2 seconds.  Neurological:     Mental Status: He is alert and oriented to person, place, and time.  Psychiatric:        Behavior: Behavior normal.  Thought Content: Thought content normal.        Judgment: Judgment normal.     Ortho Exam patient has mild myelopathic gait.  Well-healed anterior and posterior cervical incisions.  Patient has improved sensation in his arms.  Specialty Comments:  No specialty comments available.  Imaging: No results found.   PMFS History: Patient Active Problem List   Diagnosis Date Noted  . Osteoarthritis of left hip 03/19/2019  . Cervical pseudoarthrosis (HCC) 05/08/2018  . Pseudoarthrosis of cervical spine (HCC) 04/28/2018  . Cervical stenosis of spine 03/15/2018  . Other spondylosis with myelopathy, cervical region 03/09/2018  . 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 OA (osteoarthritis) of hip 08/23/2011   Past Medical History:  Diagnosis Date  . Anemia   . Arthritis   . Cervical spinal  stenosis   . Cervical spine fracture (HCC) 2  . Cervical vertebral fracture (HCC) 25 yrs ago   C 5   . Chronically elevated hemidiaphragm    Right  . 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 COPD (chronic obstructive pulmonary disease) (HCC)    mild copd per lov note dr Meredeth Ide 04-16-11 on chart  . Difficult intubation    difficult d/t anterior larynx, successful intubation using Glidescope 04/07/17  . Fracture of cervical vertebrae, multiple (HCC)  in high school   2 cervical vertebrae fx, 3 dislocated cervical vertebrae  . Grade I diastolic dysfunction    noted on ECHO  . Heart abnormality    heart anatomicall rotated, causes abnormal ekg  . Hypertension    hx on, none current  . Hypogonadism in male   . Hypothyroidism   . Impaired memory    x 2 yrs ago after neck injury  . Insomnia   . Paralysis, diaphragm    Left  . PONV (postoperative nausea and vomiting)    likes scopolamine patch, vertigo ,pt. reports that anesthesia "has had problem finding my airway"  . Prostate irregularity    nonfuctional prostate, on testerone gel bid  . Sigmoid diverticulitis   . Sleep apnea    does not use due to weight loss, does not need now, not able to use CPAP, last sleep study- 2018    Family History  Problem Relation Age of Onset  . Breast cancer Mother   . Prostate cancer Father   . Diabetes Sister   . Breast cancer Sister   . Diabetes Sister   . Colon cancer Sister     Past Surgical History:  Procedure Laterality Date  . ANTERIOR CERVICAL DECOMP/DISCECTOMY FUSION N/A 03/15/2018   Procedure: CERVICAL FOUR-FIVE, CERVICAL FIVE-SIX ANTERIOR CERVICAL DECOMPRESSION/DISCECTOMY FUSION ALLOGRAFT AND PLATE;  Surgeon: Eldred Manges, MD;  Location: [facility] OR;  Service: Orthopedics;  Laterality: N/A;  CERVICAL FOUR-FIVE, CERVICAL FIVE-SIX ANTERIOR CERVICAL DECOMPRESSION/DISCECTOMY FUSION ALLOGRAFT AND PLATE  . piliodional cyst  yrs ago   done x 3    . POSTERIOR CERVICAL FUSION/FORAMINOTOMY N/A 05/08/2018   Procedure: C4-C6 POSTERIOR CERVICAL FUSION, WIRING, ILIAC BONE MARROW ASPIRATE;  Surgeon: Eldred Manges, MD;  Location: [facility] OR;  Service: Orthopedics;  Laterality: N/A;  . right shoulder arthroscopy  Mar 02, 2011  . TONSILLECTOMY    . TOTAL HIP ARTHROPLASTY  08/23/2011   Procedure: TOTAL HIP ARTHROPLASTY;  Surgeon: Loanne Drilling, MD;  Location: [facility];  Service: Orthopedics;  Laterality: Right;  . TOTAL HIP ARTHROPLASTY Left 03/19/2019   Procedure: TOTAL HIP ARTHROPLASTY ANTERIOR APPROACH;  Surgeon: Gaynelle Arabian, MD;  Location: [facility];  Service: Orthopedics;  Laterality: Left;  176min  . TOTAL SHOULDER ARTHROPLASTY Left 04/07/2017   Procedure: LEFT TOTAL SHOULDER ARTHROPLASTY;  Surgeon: Justice Britain, MD;  Location: [facility];  Service: Orthopedics;  Laterality: Left;   Social History   Occupational History  . Occupation: Retired  Tobacco Use  . Smoking status: Never Smoker  . Smokeless tobacco: Never Used  Substance and Sexual Activity  . Alcohol use: Yes    Alcohol/week: 2.0 - 3.0 standard drinks    Types: 2 - 3 Cans of beer per week    Comment: rare use of whiskey- q 6 months   . Drug use: No  . Sexual activity: Not on file

## 2019-05-04 ENCOUNTER — Encounter: Payer: Self-pay | Admitting: Orthopaedic Surgery

## 2019-05-09 ENCOUNTER — Other Ambulatory Visit (INDEPENDENT_AMBULATORY_CARE_PROVIDER_SITE_OTHER): Payer: Self-pay | Admitting: Orthopaedic Surgery

## 2019-05-09 NOTE — Telephone Encounter (Signed)
Please advise 

## 2019-05-10 NOTE — Telephone Encounter (Signed)
Ok for last refill thanks ,  ucall

## 2019-08-22 DIAGNOSIS — H1045 Other chronic allergic conjunctivitis: Secondary | ICD-10-CM | POA: Diagnosis not present

## 2019-08-22 DIAGNOSIS — H47232 Glaucomatous optic atrophy, left eye: Secondary | ICD-10-CM | POA: Diagnosis not present

## 2019-08-22 DIAGNOSIS — H25013 Cortical age-related cataract, bilateral: Secondary | ICD-10-CM | POA: Diagnosis not present

## 2019-08-22 DIAGNOSIS — H0102B Squamous blepharitis left eye, upper and lower eyelids: Secondary | ICD-10-CM | POA: Diagnosis not present

## 2019-08-22 DIAGNOSIS — H11153 Pinguecula, bilateral: Secondary | ICD-10-CM | POA: Diagnosis not present

## 2019-08-22 DIAGNOSIS — H401134 Primary open-angle glaucoma, bilateral, indeterminate stage: Secondary | ICD-10-CM | POA: Diagnosis not present

## 2019-08-22 DIAGNOSIS — H2513 Age-related nuclear cataract, bilateral: Secondary | ICD-10-CM | POA: Diagnosis not present

## 2019-08-22 DIAGNOSIS — H0102A Squamous blepharitis right eye, upper and lower eyelids: Secondary | ICD-10-CM | POA: Diagnosis not present

## 2019-08-28 DIAGNOSIS — M5136 Other intervertebral disc degeneration, lumbar region: Secondary | ICD-10-CM | POA: Diagnosis not present

## 2019-08-28 DIAGNOSIS — E559 Vitamin D deficiency, unspecified: Secondary | ICD-10-CM | POA: Diagnosis not present

## 2019-08-28 DIAGNOSIS — E039 Hypothyroidism, unspecified: Secondary | ICD-10-CM | POA: Diagnosis not present

## 2019-08-28 DIAGNOSIS — M5416 Radiculopathy, lumbar region: Secondary | ICD-10-CM | POA: Diagnosis not present

## 2019-08-28 DIAGNOSIS — G894 Chronic pain syndrome: Secondary | ICD-10-CM | POA: Diagnosis not present

## 2019-08-28 DIAGNOSIS — N401 Enlarged prostate with lower urinary tract symptoms: Secondary | ICD-10-CM | POA: Diagnosis not present

## 2019-08-28 DIAGNOSIS — M179 Osteoarthritis of knee, unspecified: Secondary | ICD-10-CM | POA: Diagnosis not present

## 2019-08-28 DIAGNOSIS — I10 Essential (primary) hypertension: Secondary | ICD-10-CM | POA: Diagnosis not present

## 2019-08-28 DIAGNOSIS — G4733 Obstructive sleep apnea (adult) (pediatric): Secondary | ICD-10-CM | POA: Diagnosis not present

## 2019-11-21 DIAGNOSIS — H1045 Other chronic allergic conjunctivitis: Secondary | ICD-10-CM | POA: Diagnosis not present

## 2019-11-21 DIAGNOSIS — H2513 Age-related nuclear cataract, bilateral: Secondary | ICD-10-CM | POA: Diagnosis not present

## 2019-11-21 DIAGNOSIS — H401134 Primary open-angle glaucoma, bilateral, indeterminate stage: Secondary | ICD-10-CM | POA: Diagnosis not present

## 2019-11-21 DIAGNOSIS — H0102A Squamous blepharitis right eye, upper and lower eyelids: Secondary | ICD-10-CM | POA: Diagnosis not present

## 2019-11-21 DIAGNOSIS — H04123 Dry eye syndrome of bilateral lacrimal glands: Secondary | ICD-10-CM | POA: Diagnosis not present

## 2019-11-21 DIAGNOSIS — H25013 Cortical age-related cataract, bilateral: Secondary | ICD-10-CM | POA: Diagnosis not present

## 2019-11-21 DIAGNOSIS — H0102B Squamous blepharitis left eye, upper and lower eyelids: Secondary | ICD-10-CM | POA: Diagnosis not present

## 2019-11-28 DIAGNOSIS — Z96612 Presence of left artificial shoulder joint: Secondary | ICD-10-CM | POA: Diagnosis not present

## 2019-11-28 DIAGNOSIS — Z471 Aftercare following joint replacement surgery: Secondary | ICD-10-CM | POA: Diagnosis not present

## 2019-11-28 DIAGNOSIS — M25511 Pain in right shoulder: Secondary | ICD-10-CM | POA: Diagnosis not present

## 2019-11-28 DIAGNOSIS — M19111 Post-traumatic osteoarthritis, right shoulder: Secondary | ICD-10-CM | POA: Diagnosis not present

## 2019-12-06 DIAGNOSIS — M5416 Radiculopathy, lumbar region: Secondary | ICD-10-CM | POA: Diagnosis not present

## 2019-12-06 DIAGNOSIS — E559 Vitamin D deficiency, unspecified: Secondary | ICD-10-CM | POA: Diagnosis not present

## 2019-12-06 DIAGNOSIS — R270 Ataxia, unspecified: Secondary | ICD-10-CM | POA: Diagnosis not present

## 2019-12-06 DIAGNOSIS — N401 Enlarged prostate with lower urinary tract symptoms: Secondary | ICD-10-CM | POA: Diagnosis not present

## 2019-12-06 DIAGNOSIS — G4733 Obstructive sleep apnea (adult) (pediatric): Secondary | ICD-10-CM | POA: Diagnosis not present

## 2019-12-06 DIAGNOSIS — G894 Chronic pain syndrome: Secondary | ICD-10-CM | POA: Diagnosis not present

## 2019-12-06 DIAGNOSIS — M5136 Other intervertebral disc degeneration, lumbar region: Secondary | ICD-10-CM | POA: Diagnosis not present

## 2019-12-06 DIAGNOSIS — M179 Osteoarthritis of knee, unspecified: Secondary | ICD-10-CM | POA: Diagnosis not present

## 2019-12-06 DIAGNOSIS — I10 Essential (primary) hypertension: Secondary | ICD-10-CM | POA: Diagnosis not present

## 2019-12-06 NOTE — Progress Notes (Signed)
DUE TO COVID-19 ONLY ONE VISITOR IS ALLOWED TO COME WITH YOU AND STAY IN THE WAITING ROOM ONLY DURING PRE OP AND PROCEDURE DAY OF SURGERY. THE 1 VISITOR  MAY VISIT WITH YOU AFTER SURGERY IN YOUR PRIVATE ROOM DURING VISITING HOURS ONLY! 12/07/19  YOU NEED TO HAVE A COVID 19 TEST ON__8/13/21___ @_______ , THIS TEST MUST BE DONE BEFORE SURGERY,  COVID TESTING SITE [address] [place] , IT IS ON THE RIGHT GOING OUT [address] APPROXIMATELY  2 MINUTES PAST [employer] ON THE RIGHT. ONCE YOUR COVID TEST IS COMPLETED,  PLEASE BEGIN THE QUARANTINE INSTRUCTIONS AS OUTLINED IN YOUR HANDOUT.                [id number].  12/06/2019   Your procedure is scheduled on:  12/11/2019   Report to [facility] Main  Entrance   Report to admitting at    0730am     Call this number if you have problems the morning of surgery [phone]    Remember: Do not eat food   :After Midnight. BRUSH YOUR TEETH MORNING OF SURGERY AND RINSE YOUR MOUTH OUT, NO CHEWING GUM CANDY OR MINTS.  NO SOLID FOOD AFTER MIDNIGHT THE NIGHT PRIOR TO SURGERY. NOTHING BY MOUTH EXCEPT CLEAR LIQUIDS UNTIL 0700am . PLEASE FINISH ENSURE DRINK PER SURGEON ORDER  WHICH NEEDS TO BE COMPLETED AT .0700am    CLEAR LIQUID DIET   Foods Allowed                                                                   Coffee and tea, regular and decaf                             Plain Jell-O any favor except red or purple                                           Fruit ices (not with fruit pulp)                                     Iced Popsicles                                    Carbonated beverages, regular and diet                                    Cranberry, grape and apple juices Sports drinks like Gatorade Lightly seasoned clear broth or consume(fat free) Sugar, honey syrup  _____________________________________________________________________    Take these medicines the morning of surgery with A SIP OF WATER:  Gabapentin, synthroid, meclizine, eye drops  as usual                                 You may not have any metal on your body including hair pins and              piercings  Do not wear jewelry, make-up, lotions, powders or perfumes, deodorant             Do not wear nail polish on your fingernails.  Do not shave  48 hours prior to surgery.              Men may shave face and neck.   Do not bring valuables to the hospital. [facility] IS NOT             RESPONSIBLE   FOR VALUABLES.  Contacts, dentures or bridgework may not be worn into surgery.  Leave suitcase in the car. After surgery it may be brought to your room.     Patients discharged the day of surgery will not be allowed to drive home. IF YOU ARE HAVING SURGERY AND GOING HOME THE SAME DAY, YOU MUST HAVE AN ADULT TO DRIVE YOU HOME AND BE WITH YOU FOR 24 HOURS. YOU MAY GO HOME BY TAXI OR UBER OR ORTHERWISE, BUT AN ADULT MUST ACCOMPANY YOU HOME AND STAY WITH YOU FOR 24 HOURS.  Name and phone number of your driver:                Please read over the following fact sheets you were given: _____________________________________________________________________  [facility] - Preparing for Surgery Before surgery, you can play an important role.  Because skin is not sterile, your skin needs to be as free of germs as possible.  You can reduce the number of germs on your skin by washing with CHG (chlorahexidine gluconate) soap before surgery.  CHG is an antiseptic cleaner which kills germs and bonds with the skin to continue killing germs even after washing. Please DO NOT use if you have an allergy to CHG or antibacterial soaps.  If your skin becomes reddened/irritated stop using the CHG and inform your nurse when you arrive at Short Stay. Do not shave (including legs and underarms) for at least 48  hours prior to the first CHG shower.  You may shave your face/neck. Please follow these instructions carefully:  1.  Shower with CHG Soap the night before surgery and the  morning of Surgery.  2.  If you choose to wash your hair, wash your hair first as usual with your  normal  shampoo.  3.  After you shampoo, rinse your hair and body thoroughly to remove the  shampoo.                           4.  Use CHG as you would any other liquid soap.  You can apply chg directly  to the skin and wash                       Gently with a scrungie or clean washcloth.  5.  Apply the CHG Soap to your body ONLY FROM THE NECK DOWN.   Do not use on face/ open  Wound or open sores. Avoid contact with eyes, ears mouth and genitals (private parts).                       Wash face,  Genitals (private parts) with your normal soap.             6.  Wash thoroughly, paying special attention to the area where your surgery  will be performed.  7.  Thoroughly rinse your body with warm water from the neck down.  8.  DO NOT shower/wash with your normal soap after using and rinsing off  the CHG Soap.                9.  Pat yourself dry with a clean towel.            10.  Wear clean pajamas.            11.  Place clean sheets on your bed the night of your first shower and do not  sleep with pets. Day of Surgery : Do not apply any lotions/deodorants the morning of surgery.  Please wear clean clothes to the hospital/surgery center.  FAILURE TO FOLLOW THESE INSTRUCTIONS MAY RESULT IN THE CANCELLATION OF YOUR SURGERY PATIENT SIGNATURE_________________________________  NURSE SIGNATURE__________________________________  ________________________________________________________________________   Adam Phenix  An incentive spirometer is a tool that can help keep your lungs clear and active. This tool measures how well you are filling your lungs with each breath. Taking long deep breaths may help  reverse or decrease the chance of developing breathing (pulmonary) problems (especially infection) following:  A long period of time when you are unable to move or be active. BEFORE THE PROCEDURE   If the spirometer includes an indicator to show your best effort, your nurse or respiratory therapist will set it to a desired goal.  If possible, sit up straight or lean slightly forward. Try not to slouch.  Hold the incentive spirometer in an upright position. INSTRUCTIONS FOR USE  1. Sit on the edge of your bed if possible, or sit up as far as you can in bed or on a chair. 2. Hold the incentive spirometer in an upright position. 3. Breathe out normally. 4. Place the mouthpiece in your mouth and seal your lips tightly around it. 5. Breathe in slowly and as deeply as possible, raising the piston or the ball toward the top of the column. 6. Hold your breath for 3-5 seconds or for as long as possible. Allow the piston or ball to fall to the bottom of the column. 7. Remove the mouthpiece from your mouth and breathe out normally. 8. Rest for a few seconds and repeat Steps 1 through 7 at least 10 times every 1-2 hours when you are awake. Take your time and take a few normal breaths between deep breaths. 9. The spirometer may include an indicator to show your best effort. Use the indicator as a goal to work toward during each repetition. 10. After each set of 10 deep breaths, practice coughing to be sure your lungs are clear. If you have an incision (the cut made at the time of surgery), support your incision when coughing by placing a pillow or rolled up towels firmly against it. Once you are able to get out of bed, walk around indoors and cough well. You may stop using the incentive spirometer when instructed by your caregiver.  RISKS AND COMPLICATIONS  Take your time so you do not get  dizzy or light-headed.  If you are in pain, you may need to take or ask for pain medication before doing incentive  spirometry. It is harder to take a deep breath if you are having pain. AFTER USE  Rest and breathe slowly and easily.  It can be helpful to keep track of a log of your progress. Your caregiver can provide you with a simple table to help with this. If you are using the spirometer at home, follow these instructions: SEEK MEDICAL CARE IF:   You are having difficultly using the spirometer.  You have trouble using the spirometer as often as instructed.  Your pain medication is not giving enough relief while using the spirometer.  You develop fever of 100.5 F (38.1 C) or higher. SEEK IMMEDIATE MEDICAL CARE IF:   You cough up bloody sputum that had not been present before.  You develop fever of 102 F (38.9 C) or greater.  You develop worsening pain at or near the incision site. MAKE SURE YOU:   Understand these instructions.  Will watch your condition.  Will get help right away if you are not doing well or get worse. Document Released: 08/23/2006 Document Revised: 07/05/2011 Document Reviewed: 10/24/2006 Exit[employer] Patient Information 2014 [employer], [place].   ________________________________________________________________________   Incentive Spirometer  An incentive spirometer is a tool that can help keep your lungs clear and active. This tool measures how well you are filling your lungs with each breath. Taking long deep breaths may help reverse or decrease the chance of developing breathing (pulmonary) problems (especially infection) following:  A long period of time when you are unable to move or be active. BEFORE THE PROCEDURE   If the spirometer includes an indicator to show your best effort, your nurse or respiratory therapist will set it to a desired goal.  If possible, sit up straight or lean slightly forward. Try not to slouch.  Hold the incentive spirometer in an upright position. INSTRUCTIONS FOR USE  11. Sit on the edge of your bed if possible, or sit up as  far as you can in bed or on a chair. 12. Hold the incentive spirometer in an upright position. 13. Breathe out normally. 14. Place the mouthpiece in your mouth and seal your lips tightly around it. 15. Breathe in slowly and as deeply as possible, raising the piston or the ball toward the top of the column. 16. Hold your breath for 3-5 seconds or for as long as possible. Allow the piston or ball to fall to the bottom of the column. 17. Remove the mouthpiece from your mouth and breathe out normally. 18. Rest for a few seconds and repeat Steps 1 through 7 at least 10 times every 1-2 hours when you are awake. Take your time and take a few normal breaths between deep breaths. 19. The spirometer may include an indicator to show your best effort. Use the indicator as a goal to work toward during each repetition. 20. After each set of 10 deep breaths, practice coughing to be sure your lungs are clear. If you have an incision (the cut made at the time of surgery), support your incision when coughing by placing a pillow or rolled up towels firmly against it. Once you are able to get out of bed, walk around indoors and cough well. You may stop using the incentive spirometer when instructed by your caregiver.  RISKS AND COMPLICATIONS  Take your time so you do not get dizzy or light-headed.  If  you are in pain, you may need to take or ask for pain medication before doing incentive spirometry. It is harder to take a deep breath if you are having pain. AFTER USE  Rest and breathe slowly and easily.  It can be helpful to keep track of a log of your progress. Your caregiver can provide you with a simple table to help with this. If you are using the spirometer at home, follow these instructions: SEEK MEDICAL CARE IF:   You are having difficultly using the spirometer.  You have trouble using the spirometer as often as instructed.  Your pain medication is not giving enough relief while using the  spirometer.  You develop fever of 100.5 F (38.1 C) or higher. SEEK IMMEDIATE MEDICAL CARE IF:   You cough up bloody sputum that had not been present before.  You develop fever of 102 F (38.9 C) or greater.  You develop worsening pain at or near the incision site. MAKE SURE YOU:   Understand these instructions.  Will watch your condition.  Will get help right away if you are not doing well or get worse. Document Released: 08/23/2006 Document Revised: 07/05/2011 Document Reviewed: 10/24/2006 [facility] Patient Information 2014 [place], [place].   ________________________________________________________________________

## 2019-12-07 ENCOUNTER — Encounter (HOSPITAL_COMMUNITY): Payer: Self-pay

## 2019-12-07 ENCOUNTER — Other Ambulatory Visit (HOSPITAL_COMMUNITY)
Admission: RE | Admit: 2019-12-07 | Discharge: 2019-12-07 | Disposition: A | Discharge status: Home / Self Care | Payer: Medicare PPO | Source: Ambulatory Visit | Attending: Orthopedic Surgery | Admitting: Orthopedic Surgery

## 2019-12-07 ENCOUNTER — Other Ambulatory Visit: Payer: Self-pay

## 2019-12-07 ENCOUNTER — Encounter (HOSPITAL_COMMUNITY)
Admission: RE | Admit: 2019-12-07 | Discharge: 2019-12-07 | Disposition: A | Discharge status: Home / Self Care | Payer: Medicare PPO | Source: Ambulatory Visit | Attending: Orthopedic Surgery | Admitting: Orthopedic Surgery

## 2019-12-07 DIAGNOSIS — Z01818 Encounter for other preprocedural examination: Secondary | ICD-10-CM | POA: Insufficient documentation

## 2019-12-07 DIAGNOSIS — Z20822 Contact with and (suspected) exposure to covid-19: Secondary | ICD-10-CM | POA: Diagnosis not present

## 2019-12-07 LAB — SURGICAL PCR SCREEN
MRSA, PCR: NEGATIVE
Staphylococcus aureus: NEGATIVE

## 2019-12-07 LAB — SARS CORONAVIRUS 2 (TAT 6-24 HRS): SARS Coronavirus 2: NEGATIVE

## 2019-12-07 LAB — CBC
HCT: 42.8 % (ref 39.0–52.0)
Hemoglobin: 14 g/dL (ref 13.0–17.0)
MCH: 33.4 pg (ref 26.0–34.0)
MCHC: 32.7 g/dL (ref 30.0–36.0)
MCV: 102.1 fL — ABNORMAL HIGH (ref 80.0–100.0)
Platelets: 254 10*3/uL (ref 150–400)
RBC: 4.19 MIL/uL — ABNORMAL LOW (ref 4.22–5.81)
RDW: 14.2 % (ref 11.5–15.5)
WBC: 6.5 10*3/uL (ref 4.0–10.5)
nRBC: 0 % (ref 0.0–0.2)

## 2019-12-07 LAB — BASIC METABOLIC PANEL
Anion gap: 7 (ref 5–15)
BUN: 22 mg/dL (ref 8–23)
CO2: 29 mmol/L (ref 22–32)
Calcium: 9.2 mg/dL (ref 8.9–10.3)
Chloride: 105 mmol/L (ref 98–111)
Creatinine, Ser: 1.04 mg/dL (ref 0.61–1.24)
GFR calc Af Amer: >60 mL/min (ref >60)
GFR calc non Af Amer: >60 mL/min (ref >60)
Glucose, Bld: 103 mg/dL — ABNORMAL HIGH (ref 70–99)
Potassium: 4.5 mmol/L (ref 3.5–5.1)
Sodium: 141 mmol/L (ref 135–145)

## 2019-12-07 NOTE — Pre-Procedure Instructions (Signed)
COVID Vaccine Completed: yes Date COVID Vaccine completed: 06/06/2019 COVID vaccine manufacturer: Pfizer       PCP - Dr Iran Planas Cardiologist -   Chest x-ray -  EKG - 12/07/2019 Stress Test -  ECHO -  Cardiac Cath -   Sleep Study -  CPAP -   Fasting Blood Sugar -  Checks Blood Sugar _____ times a day  Blood Thinner Instructions: Aspirin Instructions: Last Dose:  Anesthesia review:   Patient denies shortness of breath, fever, cough and chest pain at PAT appointment   Patient verbalized understanding of instructions that were given to them at the PAT appointment. Patient was also instructed that they will need to review over the PAT instructions again at home before surgery.

## 2019-12-11 ENCOUNTER — Ambulatory Visit (HOSPITAL_COMMUNITY)
Admission: RE | Admit: 2019-12-11 | Discharge: 2019-12-11 | Disposition: A | Discharge status: Home / Self Care | Payer: Medicare PPO | Source: Ambulatory Visit | Attending: Orthopedic Surgery | Admitting: Orthopedic Surgery

## 2019-12-11 ENCOUNTER — Ambulatory Visit (HOSPITAL_COMMUNITY): Payer: Medicare PPO | Admitting: Registered Nurse

## 2019-12-11 ENCOUNTER — Other Ambulatory Visit: Payer: Self-pay

## 2019-12-11 ENCOUNTER — Encounter (HOSPITAL_COMMUNITY)
Admission: RE | Disposition: A | Discharge status: Home / Self Care | Payer: Self-pay | Source: Ambulatory Visit | Attending: Orthopedic Surgery

## 2019-12-11 ENCOUNTER — Encounter (HOSPITAL_COMMUNITY): Payer: Self-pay | Admitting: Orthopedic Surgery

## 2019-12-11 DIAGNOSIS — I10 Essential (primary) hypertension: Secondary | ICD-10-CM | POA: Diagnosis not present

## 2019-12-11 DIAGNOSIS — M19011 Primary osteoarthritis, right shoulder: Secondary | ICD-10-CM | POA: Insufficient documentation

## 2019-12-11 DIAGNOSIS — Z79899 Other long term (current) drug therapy: Secondary | ICD-10-CM | POA: Diagnosis not present

## 2019-12-11 DIAGNOSIS — Z96612 Presence of left artificial shoulder joint: Secondary | ICD-10-CM | POA: Diagnosis not present

## 2019-12-11 DIAGNOSIS — Z96611 Presence of right artificial shoulder joint: Secondary | ICD-10-CM

## 2019-12-11 DIAGNOSIS — E039 Hypothyroidism, unspecified: Secondary | ICD-10-CM | POA: Diagnosis not present

## 2019-12-11 DIAGNOSIS — Z79890 Hormone replacement therapy (postmenopausal): Secondary | ICD-10-CM | POA: Insufficient documentation

## 2019-12-11 DIAGNOSIS — G8918 Other acute postprocedural pain: Secondary | ICD-10-CM | POA: Diagnosis not present

## 2019-12-11 DIAGNOSIS — Z96643 Presence of artificial hip joint, bilateral: Secondary | ICD-10-CM | POA: Diagnosis not present

## 2019-12-11 DIAGNOSIS — J449 Chronic obstructive pulmonary disease, unspecified: Secondary | ICD-10-CM | POA: Insufficient documentation

## 2019-12-11 HISTORY — PX: REVERSE SHOULDER ARTHROPLASTY: SHX5054

## 2019-12-11 SURGERY — ARTHROPLASTY, SHOULDER, TOTAL, REVERSE
Anesthesia: Regional | Site: Shoulder | Laterality: Right

## 2019-12-11 MED ORDER — DEXAMETHASONE SODIUM PHOSPHATE 10 MG/ML IJ SOLN
INTRAMUSCULAR | Status: AC
  Filled 2019-12-11: qty 1

## 2019-12-11 MED ORDER — FENTANYL CITRATE (PF) 100 MCG/2ML IJ SOLN
INTRAMUSCULAR | Status: DC | PRN
  Administered 2019-12-11: 100 ug via INTRAVENOUS

## 2019-12-11 MED ORDER — BUPIVACAINE HCL (PF) 0.5 % IJ SOLN
INTRAMUSCULAR | Status: DC | PRN
  Administered 2019-12-11: 20 mL via PERINEURAL

## 2019-12-11 MED ORDER — SUCCINYLCHOLINE CHLORIDE 200 MG/10ML IV SOSY
PREFILLED_SYRINGE | INTRAVENOUS | Status: DC | PRN
  Administered 2019-12-11: 100 mg via INTRAVENOUS

## 2019-12-11 MED ORDER — ONDANSETRON HCL 4 MG/2ML IJ SOLN
INTRAMUSCULAR | Status: AC
  Filled 2019-12-11: qty 2

## 2019-12-11 MED ORDER — FENTANYL CITRATE (PF) 100 MCG/2ML IJ SOLN
50.0000 ug | INTRAMUSCULAR | Status: AC
  Administered 2019-12-11: 50 ug via INTRAVENOUS
  Filled 2019-12-11: qty 2

## 2019-12-11 MED ORDER — SUCCINYLCHOLINE CHLORIDE 200 MG/10ML IV SOSY
PREFILLED_SYRINGE | INTRAVENOUS | Status: AC
  Filled 2019-12-11: qty 10

## 2019-12-11 MED ORDER — METOCLOPRAMIDE HCL 5 MG PO TABS
5.0000 mg | ORAL_TABLET | Freq: Three times a day (TID) | ORAL | Status: DC | PRN
  Filled 2019-12-11: qty 2

## 2019-12-11 MED ORDER — ACETAMINOPHEN 10 MG/ML IV SOLN: 1000.0000 mg | Freq: Once | INTRAVENOUS | Status: DC | PRN

## 2019-12-11 MED ORDER — ROCURONIUM BROMIDE 10 MG/ML (PF) SYRINGE
PREFILLED_SYRINGE | INTRAVENOUS | Status: DC | PRN
  Administered 2019-12-11: 50 mg via INTRAVENOUS
  Administered 2019-12-11: 20 mg via INTRAVENOUS

## 2019-12-11 MED ORDER — CEFAZOLIN SODIUM-DEXTROSE 2-4 GM/100ML-% IV SOLN
2.0000 g | INTRAVENOUS | Status: AC
  Administered 2019-12-11: 2 g via INTRAVENOUS
  Filled 2019-12-11: qty 100

## 2019-12-11 MED ORDER — LACTATED RINGERS IV SOLN: INTRAVENOUS | Status: DC

## 2019-12-11 MED ORDER — PROPOFOL 10 MG/ML IV BOLUS
INTRAVENOUS | Status: DC | PRN
  Administered 2019-12-11: 160 mg via INTRAVENOUS

## 2019-12-11 MED ORDER — STERILE WATER FOR IRRIGATION IR SOLN
Status: DC | PRN
  Administered 2019-12-11 (×2): 1000 mL

## 2019-12-11 MED ORDER — METOCLOPRAMIDE HCL 5 MG/ML IJ SOLN: 5.0000 mg | Freq: Three times a day (TID) | INTRAMUSCULAR | Status: DC | PRN

## 2019-12-11 MED ORDER — FENTANYL CITRATE (PF) 100 MCG/2ML IJ SOLN: 25.0000 ug | INTRAMUSCULAR | Status: DC | PRN

## 2019-12-11 MED ORDER — PHENYLEPHRINE HCL (PRESSORS) 10 MG/ML IV SOLN
INTRAVENOUS | Status: AC
  Filled 2019-12-11: qty 2

## 2019-12-11 MED ORDER — ORAL CARE MOUTH RINSE: 15.0000 mL | Freq: Once | OROMUCOSAL | Status: AC

## 2019-12-11 MED ORDER — NAPROXEN 500 MG PO TABS
500.0000 mg | ORAL_TABLET | Freq: Two times a day (BID) | ORAL | 1 refills | Status: DC
Start: 2019-12-11 — End: 2020-04-15

## 2019-12-11 MED ORDER — VANCOMYCIN HCL 1000 MG IV SOLR
INTRAVENOUS | Status: AC
  Filled 2019-12-11: qty 1000

## 2019-12-11 MED ORDER — PHENYLEPHRINE HCL-NACL 20-0.9 MG/250ML-% IV SOLN
INTRAVENOUS | Status: DC | PRN
  Administered 2019-12-11: 10 ug/min via INTRAVENOUS
  Administered 2019-12-11: 20 ug/min via INTRAVENOUS

## 2019-12-11 MED ORDER — TRANEXAMIC ACID-NACL 1000-0.7 MG/100ML-% IV SOLN
1000.0000 mg | INTRAVENOUS | Status: AC
  Administered 2019-12-11: 1000 mg via INTRAVENOUS
  Filled 2019-12-11: qty 100

## 2019-12-11 MED ORDER — BUPIVACAINE LIPOSOME 1.3 % IJ SUSP
INTRAMUSCULAR | Status: DC | PRN
Start: 2019-12-11 — End: 2019-12-11
  Administered 2019-12-11: 133 mg via PERINEURAL

## 2019-12-11 MED ORDER — ONDANSETRON HCL 4 MG PO TABS
4.0000 mg | ORAL_TABLET | Freq: Four times a day (QID) | ORAL | Status: DC | PRN
  Filled 2019-12-11: qty 1

## 2019-12-11 MED ORDER — ACETAMINOPHEN 160 MG/5ML PO SOLN: 1000.0000 mg | Freq: Once | ORAL | Status: DC | PRN

## 2019-12-11 MED ORDER — SUGAMMADEX SODIUM 200 MG/2ML IV SOLN
INTRAVENOUS | Status: DC | PRN
  Administered 2019-12-11: 200 mg via INTRAVENOUS

## 2019-12-11 MED ORDER — LIDOCAINE 2% (20 MG/ML) 5 ML SYRINGE
INTRAMUSCULAR | Status: DC | PRN
  Administered 2019-12-11: 60 mg via INTRAVENOUS

## 2019-12-11 MED ORDER — CHLORHEXIDINE GLUCONATE 0.12 % MT SOLN
15.0000 mL | Freq: Once | OROMUCOSAL | Status: AC
  Administered 2019-12-11: 15 mL via OROMUCOSAL

## 2019-12-11 MED ORDER — ONDANSETRON HCL 4 MG/2ML IJ SOLN
INTRAMUSCULAR | Status: DC | PRN
  Administered 2019-12-11: 4 mg via INTRAVENOUS

## 2019-12-11 MED ORDER — FENTANYL CITRATE (PF) 100 MCG/2ML IJ SOLN
INTRAMUSCULAR | Status: AC
  Filled 2019-12-11: qty 2

## 2019-12-11 MED ORDER — OXYCODONE HCL 5 MG/5ML PO SOLN: 5.0000 mg | Freq: Once | ORAL | Status: DC | PRN

## 2019-12-11 MED ORDER — LACTATED RINGERS IV BOLUS
500.0000 mL | Freq: Once | INTRAVENOUS | Status: AC
  Administered 2019-12-11: 500 mL via INTRAVENOUS

## 2019-12-11 MED ORDER — LIDOCAINE 2% (20 MG/ML) 5 ML SYRINGE
INTRAMUSCULAR | Status: AC
  Filled 2019-12-11: qty 5

## 2019-12-11 MED ORDER — CYCLOBENZAPRINE HCL 10 MG PO TABS
10.0000 mg | ORAL_TABLET | Freq: Three times a day (TID) | ORAL | 1 refills | Status: DC | PRN
Start: 2019-12-11 — End: 2020-04-15

## 2019-12-11 MED ORDER — EPHEDRINE SULFATE-NACL 50-0.9 MG/10ML-% IV SOSY
PREFILLED_SYRINGE | INTRAVENOUS | Status: DC | PRN
  Administered 2019-12-11 (×2): 15 mg via INTRAVENOUS

## 2019-12-11 MED ORDER — OXYCODONE HCL 5 MG PO TABS: 5.0000 mg | ORAL_TABLET | Freq: Once | ORAL | Status: DC | PRN

## 2019-12-11 MED ORDER — ACETAMINOPHEN 500 MG PO TABS: 1000.0000 mg | ORAL_TABLET | Freq: Once | ORAL | Status: DC | PRN

## 2019-12-11 MED ORDER — PROPOFOL 10 MG/ML IV BOLUS
INTRAVENOUS | Status: AC
  Filled 2019-12-11: qty 20

## 2019-12-11 MED ORDER — ONDANSETRON HCL 4 MG/2ML IJ SOLN: 4.0000 mg | Freq: Four times a day (QID) | INTRAMUSCULAR | Status: DC | PRN

## 2019-12-11 MED ORDER — ROCURONIUM BROMIDE 10 MG/ML (PF) SYRINGE
PREFILLED_SYRINGE | INTRAVENOUS | Status: AC
  Filled 2019-12-11: qty 10

## 2019-12-11 MED ORDER — SODIUM CHLORIDE 0.9 % IR SOLN
Status: DC | PRN
  Administered 2019-12-11: 1000 mL

## 2019-12-11 MED ORDER — LACTATED RINGERS IV BOLUS: 250.0000 mL | Freq: Once | INTRAVENOUS | Status: DC

## 2019-12-11 MED ORDER — ONDANSETRON HCL 4 MG PO TABS: 4.0000 mg | ORAL_TABLET | Freq: Three times a day (TID) | ORAL | 0 refills | Status: DC | PRN

## 2019-12-11 MED ORDER — OXYCODONE-ACETAMINOPHEN 5-325 MG PO TABS: 1.0000 | ORAL_TABLET | ORAL | 0 refills | Status: DC | PRN

## 2019-12-11 MED ORDER — DEXAMETHASONE SODIUM PHOSPHATE 10 MG/ML IJ SOLN
INTRAMUSCULAR | Status: DC | PRN
  Administered 2019-12-11: 10 mg via INTRAVENOUS

## 2019-12-11 MED ORDER — VANCOMYCIN HCL 1000 MG IV SOLR
INTRAVENOUS | Status: DC | PRN
  Administered 2019-12-11: 1000 mg via TOPICAL

## 2019-12-11 SURGICAL SUPPLY — 71 items
ADH SKN CLS APL DERMABOND .7 (GAUZE/BANDAGES/DRESSINGS) ×1
AID PSTN UNV HD RSTRNT DISP (MISCELLANEOUS) ×1
BAG SPEC THK2 15X12 ZIP CLS (MISCELLANEOUS) ×1
BAG ZIPLOCK 12X15 (MISCELLANEOUS) ×3 IMPLANT
BLADE SAW SGTL 83.5X18.5 (BLADE) ×3 IMPLANT
BSPLAT GLND +2X24 MDLR (Joint) ×1 IMPLANT
COOLER ICEMAN CLASSIC (MISCELLANEOUS) IMPLANT
COVER BACK TABLE 60X90IN (DRAPES) ×3 IMPLANT
COVER SURGICAL LIGHT HANDLE (MISCELLANEOUS) ×3 IMPLANT
COVER WAND RF STERILE (DRAPES) IMPLANT
CUP SUT UNIV REVERS 39 NEU (Shoulder) ×3 IMPLANT
DERMABOND ADVANCED (GAUZE/BANDAGES/DRESSINGS) ×2
DERMABOND ADVANCED .7 DNX12 (GAUZE/BANDAGES/DRESSINGS) ×1 IMPLANT
DRAPE INCISE IOBAN 66X45 STRL (DRAPES) IMPLANT
DRAPE ORTHO SPLIT 77X108 STRL (DRAPES) ×6
DRAPE SHEET LG 3/4 BI-LAMINATE (DRAPES) ×3 IMPLANT
DRAPE SURG 17X11 SM STRL (DRAPES) ×3 IMPLANT
DRAPE SURG ORHT 6 SPLT 77X108 (DRAPES) ×2 IMPLANT
DRAPE U-SHAPE 47X51 STRL (DRAPES) ×3 IMPLANT
DRESSING AQUACEL AG SP 3.5X6 (GAUZE/BANDAGES/DRESSINGS) ×1 IMPLANT
DRSG AQUACEL AG ADV 3.5X10 (GAUZE/BANDAGES/DRESSINGS) ×3 IMPLANT
DRSG AQUACEL AG SP 3.5X6 (GAUZE/BANDAGES/DRESSINGS) ×3
DURAPREP 26ML APPLICATOR (WOUND CARE) ×3 IMPLANT
ELECT BLADE TIP CTD 4 INCH (ELECTRODE) ×3 IMPLANT
ELECT REM PT RETURN 15FT ADLT (MISCELLANEOUS) ×3 IMPLANT
FACESHIELD WRAPAROUND (MASK) ×12 IMPLANT
GLENOID UNI REV MOD 24 +2 LAT (Joint) ×3 IMPLANT
GLENOSPHERE 39+4 LAT/24 UNI RV (Joint) ×3 IMPLANT
GLOVE BIO SURGEON STRL SZ7.5 (GLOVE) ×3 IMPLANT
GLOVE BIO SURGEON STRL SZ8 (GLOVE) ×3 IMPLANT
GLOVE SS BIOGEL STRL SZ 7 (GLOVE) ×1 IMPLANT
GLOVE SS BIOGEL STRL SZ 7.5 (GLOVE) ×1 IMPLANT
GLOVE SUPERSENSE BIOGEL SZ 7 (GLOVE) ×2
GLOVE SUPERSENSE BIOGEL SZ 7.5 (GLOVE) ×2
GLOVE SURG SYN 7.0 (GLOVE) IMPLANT
GLOVE SURG SYN 7.5  E (GLOVE)
GLOVE SURG SYN 7.5 E (GLOVE) IMPLANT
GLOVE SURG SYN 8.0 (GLOVE) IMPLANT
GOWN STRL REUS W/TWL LRG LVL3 (GOWN DISPOSABLE) ×6 IMPLANT
INSERT HUMERAL 39/+6 (Insert) ×3 IMPLANT
KIT BASIN OR (CUSTOM PROCEDURE TRAY) ×3 IMPLANT
KIT TURNOVER KIT A (KITS) IMPLANT
MANIFOLD NEPTUNE II (INSTRUMENTS) ×3 IMPLANT
NEEDLE TAPERED W/ NITINOL LOOP (MISCELLANEOUS) ×3 IMPLANT
NS IRRIG 1000ML POUR BTL (IV SOLUTION) ×3 IMPLANT
PACK SHOULDER (CUSTOM PROCEDURE TRAY) ×3 IMPLANT
PAD ARMBOARD 7.5X6 YLW CONV (MISCELLANEOUS) ×3 IMPLANT
PAD COLD SHLDR WRAP-ON (PAD) IMPLANT
PIN SET MODULAR GLENOID SYSTEM (PIN) ×3 IMPLANT
RESTRAINT HEAD UNIVERSAL NS (MISCELLANEOUS) ×3 IMPLANT
SCREW CENTRAL MOD 30MM (Screw) ×3 IMPLANT
SCREW PERI LOCK 5.5X16 (Screw) ×3 IMPLANT
SCREW PERI LOCK 5.5X36 (Screw) ×3 IMPLANT
SCREW PERIPHERAL 5.5X20 LOCK (Screw) ×3 IMPLANT
SCREW PERIPHERAL 5.5X28 LOCK (Screw) ×3 IMPLANT
SLING ARM FOAM STRAP LRG (SOFTGOODS) ×3 IMPLANT
SLING ARM FOAM STRAP MED (SOFTGOODS) IMPLANT
SPONGE LAP 18X18 RF (DISPOSABLE) IMPLANT
STEM HUMERAL UNI REV CAP SZ13 (Stem) ×3 IMPLANT
SUCTION FRAZIER HANDLE 12FR (TUBING) ×3
SUCTION TUBE FRAZIER 12FR DISP (TUBING) ×1 IMPLANT
SUT FIBERWIRE #2 38 T-5 BLUE (SUTURE)
SUT MNCRL AB 3-0 PS2 18 (SUTURE) ×3 IMPLANT
SUT MON AB 2-0 CT1 36 (SUTURE) ×3 IMPLANT
SUT VIC AB 1 CT1 36 (SUTURE) ×3 IMPLANT
SUTURE FIBERWR #2 38 T-5 BLUE (SUTURE) IMPLANT
SUTURE TAPE 1.3 40 TPR END (SUTURE) ×2 IMPLANT
SUTURETAPE 1.3 40 TPR END (SUTURE) ×6
TOWEL OR 17X26 10 PK STRL BLUE (TOWEL DISPOSABLE) ×3 IMPLANT
TOWEL OR NON WOVEN STRL DISP B (DISPOSABLE) ×3 IMPLANT
WATER STERILE IRR 1000ML POUR (IV SOLUTION) ×6 IMPLANT

## 2019-12-11 NOTE — Progress Notes (Signed)
Occupational Therapy Evaluation  s/p shoulder replacement without functional use of right dominant upper extremity secondary to effects of surgery and interscalene block and shoulder precautions. Therapist provided education and instruction to patient and spouse in regards to exercises, precautions, positioning, donning upper extremity clothing and bathing while maintaining shoulder precautions, ice and edema management and donning/doffing sling. Patient and spouse verbalized understanding and demonstrated as needed. Patient needed assistance to donn shirt, underwear, pants, socks and shoes and provided with instruction on compensatory strategies to perform ADLs. Patient to follow up with MD for further therapy needs.     12/11/19 1600  OT Visit Information  Last OT Received On 12/11/19  Assistance Needed +1  History of Present Illness Patient is a 79 year old male admitted for Right shoulder reverse arthroplasty. Past medical history includes but not limited to ANTERIOR CERVICAL DECOMP/DISCECTOMY FUSION, POSTERIOR CERVICAL FUSION/FORAMINOTOMY, bilateral hip replacements.  Precautions  Precautions Shoulder  Type of Shoulder Precautions AROM elbow, wrist and hand ok, Pendulums and lap slides ok, P/AA/AROM for ADLs sh FF 60, ABD 45, ER 20  Shoulder Interventions Shoulder sling/immobilizer;Off for dressing/bathing/exercises (off in controlled setting)  Precaution Booklet Issued Yes (comment)  Required Braces or Orthoses Sling  Restrictions  Weight Bearing Restrictions Yes  RUE Weight Bearing NWB  Home Living  Family/patient expects to be discharged to: Private residence  Living Arrangements Spouse/significant other  Available Help at Discharge Family;Available 24 hours/day  Type of Home House  Home Access Stairs to enter  Entrance Stairs-Number of Steps 9 in front with 2 hand rails, 3 in back with none  Entrance Stairs-Rails None;Right;Left  Home Layout Two level;Able to live on main level  with bedroom/bathroom;Full bath on main level  Alternate Level Stairs-Number of Steps 15  Alternate Level Stairs-Rails Right;Left  Scientist, physiological Yes  How Accessible Accessible via walker  Home Equipment Walker - 2 wheels;Cane - single point  Prior Function  Level of Independence Independent  Comments patient very active at baseline, spouse reports pt chops down trees, splits wood  Communication  Communication No difficulties  Pain Assessment  Pain Assessment No/denies pain  Cognition  Arousal/Alertness Awake/alert  Behavior During Therapy Impulsive  Overall Cognitive Status Within Functional Limits for tasks assessed  General Comments patient wanting to get home, cues to redirect during dressing   Upper Extremity Assessment  Upper Extremity Assessment RUE deficits/detail  RUE Deficits / Details + nerve block, hand/wrist AROM grossly intact   Lower Extremity Assessment  Lower Extremity Assessment Overall WFL for tasks assessed  Cervical / Trunk Assessment  Cervical / Trunk Assessment Normal  ADL  Overall ADL's  Needs assistance/impaired  Grooming Wash/dry hands;Independent  Upper Body Bathing Minimal assistance;Sitting;Standing  Lower Body Bathing Minimal assistance;Sitting/lateral leans;Sit to/from stand  Upper Body Dressing  Minimal assistance;Cueing for compensatory techniques;Cueing for UE precautions;Cueing for sequencing;Sitting  Upper Body Dressing Details (indicate cue type and reason) to assist with threading R UE due to nerve block  Lower Body Dressing Minimal assistance;Sitting/lateral leans;Sit to/from stand;Cueing for safety;Cueing for sequencing;Cueing for compensatory techniques  Lower Body Dressing Details (indicate cue type and reason) min A to pull clothing up over R hip   Toilet Transfer Independent  Toileting- Clothing Manipulation and Hygiene Independent  Toileting - Clothing Manipulation  Details (indicate cue type and reason) "I was able to zip my pants up myself"  Functional mobility during ADLs Independent  General ADL Comments patient and spouse educated in shoulder protocol  and compensatory strategies for self care, verbalize and return demo understanding  Bed Mobility  General bed mobility comments in chair  Transfers  Overall transfer level Independent  Balance  Overall balance assessment Mild deficits observed, not formally tested  Exercises  Exercises Shoulder  Shoulder Instructions  Donning/doffing shirt without moving shoulder Minimal assistance;Patient able to independently direct caregiver;Caregiver independent with task  Method for sponge bathing under operated UE Minimal assistance;Caregiver independent with task;Patient able to independently direct caregiver  Donning/doffing sling/immobilizer Maximal assistance;Patient able to independently direct caregiver  Correct positioning of sling/immobilizer Minimal assistance;Patient able to independently direct caregiver  Pendulum exercises (written home exercise program) Patient able to independently direct caregiver;Caregiver independent with task  ROM for elbow, wrist and digits of operated UE Caregiver independent with task;Patient able to independently direct caregiver  Sling wearing schedule (on at all times/off for ADL's) Caregiver independent with task;Patient able to independently direct caregiver  Proper positioning of operated UE when showering Caregiver independent with task;Patient able to independently direct caregiver  Positioning of UE while sleeping Caregiver independent with task;Patient able to independently direct caregiver  OT - End of Session  Equipment Utilized During Treatment  (sling)  Activity Tolerance Patient tolerated treatment well  Patient left in chair;with call bell/phone within reach;with family/visitor present  Nurse Communication Mobility status  OT Assessment  OT  Recommendation/Assessment Progress rehab of shoulder as ordered by MD at follow-up appointment  OT Visit Diagnosis Pain  Pain - Right/Left Right  Pain - part of body Shoulder  OT Problem List Impaired UE functional use;Pain  AM-PAC OT "6 Clicks" Daily Activity Outcome Measure (Version 2)  Help from another person eating meals? 4  Help from another person taking care of personal grooming? 3  Help from another person toileting, which includes using toliet, bedpan, or urinal? 3  Help from another person bathing (including washing, rinsing, drying)? 3  Help from another person to put on and taking off regular upper body clothing? 3  Help from another person to put on and taking off regular lower body clothing? 3  6 Click Score 19  OT Recommendation  Follow Up Recommendations Follow surgeon's recommendation for DC plan and follow-up therapies  OT Equipment None recommended by OT  Acute Rehab OT Goals  Patient Stated Goal go home  OT Goal Formulation With patient  OT Time Calculation  OT Start Time (ACUTE ONLY) 1522  OT Stop Time (ACUTE ONLY) 1604  OT Time Calculation (min) 42 min  OT General Charges  $OT Visit 1 Visit  OT Evaluation  $OT Eval Low Complexity 1 Low  OT Treatments  $Self Care/Home Management  23-37 mins  Written Expression  Dominant Hand Right   Marlyce Huge OT OT pager: [phone]

## 2019-12-11 NOTE — Anesthesia Preprocedure Evaluation (Signed)
Anesthesia Evaluation  Patient identified by MRN, date of birth, ID band Patient awake    Reviewed: Allergy & Precautions, NPO status , Patient's Chart, lab work & pertinent test results  History of Anesthesia Complications (+) PONV, DIFFICULT AIRWAY and history of anesthetic complications  Airway Mallampati: III  TM Distance: >3 FB Neck ROM: Limited    Dental  (+) Dental Advisory Given, Teeth Intact   Pulmonary neg shortness of breath, neg sleep apnea, COPD, neg recent URI,    breath sounds clear to auscultation       Cardiovascular hypertension, Pt. on medications (-) angina(-) Past MI and (-) CHF  Rhythm:Regular     Neuro/Psych  Neuromuscular disease    GI/Hepatic negative GI ROS, Neg liver ROS,   Endo/Other  neg diabetesHypothyroidism   Renal/GU negative Renal ROS     Musculoskeletal  (+) Arthritis ,   Abdominal   Peds  Hematology   Anesthesia Other Findings   Reproductive/Obstetrics                             Anesthesia Physical Anesthesia Plan  ASA: II  Anesthesia Plan: General and Regional   Post-op Pain Management:  Regional for Post-op pain   Induction: Intravenous  PONV Risk Score and Plan: 3 and Ondansetron and Dexamethasone  Airway Management Planned: Oral ETT  Additional Equipment: None  Intra-op Plan:   Post-operative Plan: Extubation in OR  Informed Consent: I have reviewed the patients History and Physical, chart, labs and discussed the procedure including the risks, benefits and alternatives for the proposed anesthesia with the patient or authorized representative who has indicated his/her understanding and acceptance.     Dental advisory given  Plan Discussed with: CRNA and Surgeon  Anesthesia Plan Comments:         Anesthesia Quick Evaluation

## 2019-12-11 NOTE — Discharge Instructions (Signed)
 Kevin M. Supple, M.D., F.A.A.O.S. Orthopa[facility] Surgery Specializing in Arthroscopic and Reconstructive Surgery of the Shoulder [phone] [address] - [place],  [address] - Fax [phone]   POST-OP TOTAL SHOULDER REPLACEMENT INSTRUCTIONS  1. Follow up in the office for your first post-op appointment 10-14 days from the date of your surgery. If you do not already have a scheduled appointment, our office will contact you to schedule.  2. The bandage over your incision is waterproof. You may begin showering with this dressing on. You may leave this dressing on until first follow up appointment within 2 weeks. We prefer you leave this dressing in place until follow up however after 5-7 days if you are having itching or skin irritation and would like to remove it you may do so. Go slow and tug at the borders gently to break the bond the dressing has with the skin. At this point if there is no drainage it is okay to go without a bandage or you may cover it with a light guaze and tape. You can also expect significant bruising around your shoulder that will drift down your arm and into your chest wall. This is very normal and should resolve over several days.   3. Wear your sling/immobilizer at all times except to perform the exercises below or to occasionally let your arm dangle by your side to stretch your elbow. You also need to sleep in your sling immobilizer until instructed otherwise. It is ok to remove your sling if you are sitting in a controlled environment and allow your arm to rest in a position of comfort by your side or on your lap with pillows to give your neck and skin a break from the sling. You may remove it to allow arm to dangle by side to shower. If you are up walking around and when you go to sleep at night you need to wear it.  4. Range of motion to your elbow, wrist, and hand are encouraged 3-5 times daily. Exercise to your hand and fingers helps to reduce  swelling you may experience.   5. Prescriptions for a pain medication and a muscle relaxant are provided for you. It is recommended that if you are experiencing pain that you pain medication alone is not controlling, add the muscle relaxant along with the pain medication which can give additional pain relief. The first 1-2 days is generally the most severe of your pain and then should gradually decrease. As your pain lessens it is recommended that you decrease your use of the pain medications to an "as needed basis'" only and to always comply with the recommended dosages of the pain medications.  6. Pain medications can produce constipation along with their use. If you experience this, the use of an over the counter stool softener or laxative daily is recommended.   7. For additional questions or concerns, please do not hesitate to call the office. If after hours there is an answering service to forward your concerns to the physician on call.  8.Pain control following an exparel block  To help control your post-operative pain you received a nerve block  performed with Exparel which is a long acting anesthetic (numbing agent) which can provide pain relief and sensations of numbness (and relief of pain) in the operative shoulder and arm for up to 3 days. Sometimes it provides mixed relief, meaning you may still have numbness in certain areas of the arm but can still be able to   move  parts of that arm, hand, and fingers. We recommend that your prescribed pain medications  be used as needed. We do not feel it is necessary to "pre medicate" and "stay ahead" of pain.  Taking narcotic pain medications when you are not having any pain can lead to unnecessary and potentially dangerous side effects.    9. Use the ice machine as much as possible in the first 5-7 days from surgery, then you can wean its use to as needed. The ice typically needs to be replaced every 6 hours, instead of ice you can actually freeze  water bottles to put in the cooler and then fill water around them to avoid having to purchase ice. You can have spare water bottles freezing to allow you to rotate them once they have melted. Try to have a thin shirt or light cloth or towel under the ice wrap to protect your skin.   10.  We recommend that you avoid any dental work or cleaning in the first 3 months following your joint replacement. This is to help minimize the possibility of infection from the bacteria in your mouth that enters your bloodstream during dental work. We also recommend that you take an antibiotic prior to your dental work for the first year after your shoulder replacement to further help reduce that risk. Please simply contact our office for antibiotics to be sent to your pharmacy prior to dental work.  11. Dental Antibiotics:  In most cases prophylactic antibiotics for Dental procdeures after total joint surgery are not necessary.  Exceptions are as follows:  1. History of prior total joint infection  2. Severely immunocompromised (Organ Transplant, cancer chemotherapy, Rheumatoid biologic meds such as Humera)  3. Poorly controlled diabetes (A1C &gt; 8.0, blood glucose over 200)  If you have one of these conditions, contact your surgeon for an antibiotic prescription, prior to your dental procedure.   POST-OP EXERCISES  Pendulum Exercises  Perform pendulum exercises while standing and bending at the waist. Support your uninvolved arm on a table or chair and allow your operated arm to hang freely. Make sure to do these exercises passively - not using you shoulder muscles. These exercises can be performed once your nerve block effects have worn off.  Repeat 20 times. Do 3 sessions per day.     

## 2019-12-11 NOTE — Transfer of Care (Signed)
Immediate Anesthesia Transfer of Care Note  Patient: Jason Friedman.  Procedure(s) Performed: REVERSE SHOULDER ARTHROPLASTY (Right Shoulder)  Patient Location: PACU  Anesthesia Type:General  Level of Consciousness: awake, alert , oriented and patient cooperative  Airway & Oxygen Therapy: Patient Spontanous Breathing and Patient connected to face mask oxygen  Post-op Assessment: Report given to RN, Post -op Vital signs reviewed and stable and Patient moving all extremities  Post vital signs: Reviewed and stable  Last Vitals:  Vitals Value Taken Time  BP 125/74 12/11/19 1216  Temp    Pulse 70 12/11/19 1217  Resp 14 12/11/19 1217  SpO2 100 % 12/11/19 1217  Vitals shown include unvalidated device data.  Last Pain:  Vitals:   12/11/19 0800  TempSrc: Oral  PainSc:       Patients Stated Pain Goal: 4 (12/11/19 0755)  Complications: No complications documented.

## 2019-12-11 NOTE — Anesthesia Procedure Notes (Signed)
Procedure Name: Intubation Date/Time: 12/11/2019 10:27 AM Performed by: Elisabeth Cara, CRNA Pre-anesthesia Checklist: Patient identified, Emergency Drugs available, Suction available, Patient being monitored and Timeout performed Patient Re-evaluated:Patient Re-evaluated prior to induction Oxygen Delivery Method: Circle system utilized Preoxygenation: Pre-oxygenation with 100% oxygen Induction Type: IV induction Ventilation: Mask ventilation without difficulty Laryngoscope Size: Glidescope and 3 Grade View: Grade I Tube type: Parker flex tip Tube size: 7.5 mm Number of attempts: 1 Airway Equipment and Method: Video-laryngoscopy and Stylet Placement Confirmation: ETT inserted through vocal cords under direct vision,  positive ETCO2 and breath sounds checked- equal and bilateral Secured at: 23 cm Tube secured with: Tape Dental Injury: Teeth and Oropharynx as per pre-operative assessment  Difficulty Due To: Difficult Airway- due to reduced neck mobility Comments: Elective Glidescope intubation due to multiple cervical neck fusions and decreased neck range of motion.

## 2019-12-11 NOTE — Progress Notes (Signed)
AssistedDr. Moser with right, ultrasound guided, interscalene  block. Side rails up, monitors on throughout procedure. See vital signs in flow sheet. Tolerated Procedure well.  

## 2019-12-11 NOTE — Op Note (Signed)
12/11/2019  11:56 AM  PATIENT:   Jason Friedman.  79 y.o. 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  ASSISTANTS: Ralene Bathe, PA-C  ANESTHESIA:   General endotracheal and interscalene block with Exparel  EBL: 150 cc  SPECIMEN: None  Drains: None   PATIENT DISPOSITION:  PACU - hemodynamically stable.    PLAN OF CARE: Discharge to home after PACU  Brief history:  [name] is a 79 year old gentleman who has had chronic and progressively increasing right shoulder pain related to severe arthritis with known rotator cuff dysfunction status post remote history of rotator cuff repair.  His recent clinical exam demonstrates profound weakness as well as severely restricted mobility.  Due to his increasing pain and functional imitations and failure to respond to conservative management he is brought to the operating this time for planned right shoulder reverse arthroplasty.  Preoperatively, I counseled the patient regarding treatment options and risks versus benefits thereof.  Possible surgical complications were all reviewed including potential for bleeding, infection, neurovascular injury, persistent pain, loss of motion, anesthetic complication, failure of the implant, and possible need for additional surgery. They understand and accept and agrees with our planned procedure.   Procedure in detail:  After undergoing routine preop evaluation the patient received prophylactic antibiotics and interscalene block with Exparel was established in the holding area by the anesthesia department.  Patient subsequently placed supine on the operating table and underwent the smooth induction of a general endotracheal  anesthesia.  Placed in the beachchair position and appropriately padded and protected.  The right shoulder girdle region was sterilely prepped and draped in standard fashion.  Timeout was called.  An anterior deltopectoral approach to the right shoulder was made through a 10 cm incision.  Skin flaps were elevated dissection carried deeply and electrocautery was used for hemostasis.  The cephalic vein was identified and taken laterally with the deltoid the deltopectoral interval was then developed from proximal to distal with the upper centimeter and a half the pectoralis major tenotomized for exposure.  Adhesions were divided from beneath the deltoid and the conjoined tendon was then mobilized and retracted medially.  The long head biceps tendon was then unroofed and tenodesed at the upper border the pectoralis major and the proximal segment was then unroofed and excised.  The rotator cuff was split along the rotator interval to the base of the coracoid and the subscapularis was then elevated from the lesser tuberosity using electrocautery and it was then tagged with a pair of suture tape sutures.  Capsular attachments from the anterior and infra margin of the humeral head were divided and the humeral head was then delivered through the wound.  We did also release some superior cuff to gain exposure.  A extra medullary guide was then used to outline the proposed humeral head resection which we then prepped formed with an oscillating saw matching the native retroversion of approximate 30 degrees.  Large osteophytes were then removed from the anterior and infra margins of the humeral neck.  A metal cap was placed over the cut proximal humeral surface and at this point we then exposed the glenoid with appropriate retractors.  I performed a circumferential labral resection gaining complete visualization the periphery of the glenoid.  A guidepin  was then directed into the center of the glenoid correcting the moderate  degree of retroversion and applied an approximately 10 degree inferior tilt.  The central and peripheral reamers were then used to obtain a stable subchondral bony bed in the central drill hole was prepared and tapped and a 30 mm lag screw was then selected and the baseplate was assembled and introduced with excellent fit and fixation.  I should also mention that we applied vancomycin powder liberally to the baseplate lag screw given the fact the patient had had previous arthroscopic surgery.  We then placed the peripheral locking screws all of which achieved good purchase and fixation.  The 39/+4 glenosphere was impacted onto the baseplate and the central locking screw was placed with good fixation.  We then returned our attention to the proximal humerus where the canal was opened and ultimately we broached up to a size 13 stem with excellent fit and fixation.  The metaphyseal reamer was then utilized and a trial implant was then placed which showed good soft tissue balance and good stability good motion.  A trial was then removed and the final implant was assembled with a neutral metaphysis.  We did liberally coated the implant with vancomycin powder and also placed a stent into the humeral canal and then we seated our implant at the native retroversion of approximate 30 degrees achieving excellent fit and fixation.  We then performed a series of trial reductions and ultimately felt that the +6 polyethylene insert gave [place] the best stability.  The trial was removed and the final +6 polywas then impacted after we cleaned the implant meticulously.  Final reduction was then performed and this showed excellent soft tissue balance good motion good stability.  We then confirmed good mobility of the subscapularis and this was then repaired back to the eyelets on the collar the implant using the previously placed suture tape sutures.  At this point the arm easily achieved 45 degrees of external rotation without excessive  tension on the subscap repair.  The wound was then copious irrigated.  Hemostasis was obtained.  The deltopectoral interval was reapproximated with a series of figure-of-eight and 1 Vicryl sutures.  2-0 Vicryl used for subcu layer and intracuticular 3-0 Monocryl for the skin followed by Dermabond and Aquasol dressing.  Right and was placed in a sling patient was awakened, extubated, and taken to the recovery room in stable condition.  Ralene Bathe, PA-C was utilized as an Geophysicist/field seismologist throughout this case, essential for help with positioning the patient, positioning extremity, tissue manipulation, implantation of the prosthesis, suture management, wound closure, and intraoperative decision-making.  Senaida Lange MD   Contact # [phone]

## 2019-12-11 NOTE — Anesthesia Procedure Notes (Signed)
Anesthesia Regional Block: Interscalene brachial plexus block   Pre-Anesthetic Checklist: ,, timeout performed, Correct Patient, Correct Site, Correct Laterality, Correct Procedure, Correct Position, site marked, Risks and benefits discussed,  Surgical consent,  Pre-op evaluation,  At surgeon's request and post-op pain management  Laterality: Right and Upper  Prep: chloraprep       Needles:  Injection technique: Single-shot     Needle Length: 5cm  Needle Gauge: 22     Additional Needles: Arrow StimuQuik ECHO Echogenic Stimulating PNB Needle  Procedures:,,,, ultrasound used (permanent image in chart),,,,  Narrative:  Start time: 12/11/2019 9:51 AM End time: 12/11/2019 9:55 AM Injection made incrementally with aspirations every 5 mL.  Performed by: Personally  Anesthesiologist: Val Eagle, MD

## 2019-12-11 NOTE — H&P (Signed)
Jason Friedman.    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 male with a history of chronic and progressive increasing right shoulder pain with a recent acute exacerbation.  He does have a remote history of right shoulder arthroscopic surgery with rotator cuff repair.  His more recent examination demonstrates significant weakness with concern for associated chronic rotator cuff dysfunction.  Due to his increasing pain and functional mutations and failure to respond to conservative management he is brought to the operating this time for planned right shoulder reverse arthroplasty  Past Medical History:  Diagnosis Date  . Anemia   . Arthritis   . Cervical spinal stenosis   . Cervical spine fracture (HCC) 2  . Cervical vertebral fracture (HCC) 25 yrs ago   C 5   . Chronically elevated hemidiaphragm    Right  . 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 COPD (chronic obstructive pulmonary disease) (HCC)    mild copd per lov note dr Meredeth Ide 04-16-11 on chart  . Difficult intubation    difficult d/t anterior larynx, successful intubation using Glidescope 04/07/17  . Fracture of cervical vertebrae, multiple (HCC)  in high school   2 cervical vertebrae fx, 3 dislocated cervical vertebrae  . Grade I diastolic dysfunction    noted on ECHO  . Heart abnormality    heart anatomicall rotated, causes abnormal ekg  . Hypertension    hx on, none current  . Hypogonadism in male   . Hypothyroidism   . Impaired memory    x 2 yrs ago after neck injury  . Insomnia   . Paralysis, diaphragm    Left  . PONV (postoperative nausea and vomiting)    likes scopolamine patch, vertigo ,pt. reports that anesthesia "has had problem finding my airway"  . Prostate irregularity    nonfuctional prostate, on testerone gel bid  . Sigmoid diverticulitis   . Sleep apnea    does not use due to weight  loss, does not need now, not able to use CPAP, last sleep study- 2018    Past Surgical History:  Procedure Laterality Date  . ANTERIOR CERVICAL DECOMP/DISCECTOMY FUSION N/A 03/15/2018   Procedure: CERVICAL FOUR-FIVE, CERVICAL FIVE-SIX ANTERIOR CERVICAL DECOMPRESSION/DISCECTOMY FUSION ALLOGRAFT AND PLATE;  Surgeon: Eldred Manges, MD;  Location: [facility] OR;  Service: Orthopedics;  Laterality: N/A;  CERVICAL FOUR-FIVE, CERVICAL FIVE-SIX ANTERIOR CERVICAL DECOMPRESSION/DISCECTOMY FUSION ALLOGRAFT AND PLATE  . piliodional cyst  yrs ago   done x 3  . POSTERIOR CERVICAL FUSION/FORAMINOTOMY N/A 05/08/2018   Procedure: C4-C6 POSTERIOR CERVICAL FUSION, WIRING, ILIAC BONE MARROW ASPIRATE;  Surgeon: Eldred Manges, MD;  Location: [facility] OR;  Service: Orthopedics;  Laterality: N/A;  . right shoulder arthroscopy  Mar 02, 2011  . TONSILLECTOMY    . TOTAL HIP ARTHROPLASTY  08/23/2011   Procedure: TOTAL HIP ARTHROPLASTY;  Surgeon: Loanne Drilling, MD;  Location: [facility] ORS;  Service: Orthopedics;  Laterality: Right;  . TOTAL HIP ARTHROPLASTY Left 03/19/2019   Procedure: TOTAL HIP ARTHROPLASTY ANTERIOR APPROACH;  Surgeon: Ollen Gross, MD;  Location: [facility];  Service: Orthopedics;  Laterality: Left;   . TOTAL SHOULDER ARTHROPLASTY Left 04/07/2017   Procedure: LEFT TOTAL SHOULDER ARTHROPLASTY;  Surgeon: Francena Hanly, MD;  Location: [facility] OR;  Service: Orthopedics;  Laterality: Left;    Family History  Problem Relation Age of Onset  . Breast cancer Mother   . Prostate  cancer Father   . Diabetes Sister   . Breast cancer Sister   . Diabetes Sister   . Colon cancer Sister     Social History:  reports that he has never smoked. He has never used smokeless tobacco. He reports current alcohol use of about 2.0 - 3.0 standard drinks of alcohol per week. He reports that he does not use drugs.   Medications Prior to Admission  Medication Sig Dispense Refill  . amLODipine (NORVASC) 5 MG tablet Take 5 mg by mouth every  evening.     . gabapentin (NEURONTIN) 100 MG capsule Take 100 mg by mouth 3 (three) times daily.    Marland Kitchen HYDROcodone-acetaminophen (NORCO/VICODIN) 5-325 MG tablet Take 1 tablet by mouth every 6 (six) hours as needed for moderate pain.    Marland Kitchen levothyroxine (SYNTHROID, LEVOTHROID) 137 MCG tablet Take 137 mcg by mouth daily before breakfast.   3  . losartan (COZAAR) 50 MG tablet Take 50 mg by mouth every evening.     . meclizine (ANTIVERT) 25 MG tablet Take 25 mg by mouth 2 (two) times daily.     . Multiple Vitamins-Minerals (MULTIVITAMIN WITH MINERALS) tablet Take 1 tablet by mouth daily.    Marland Kitchen tiZANidine (ZANAFLEX) 4 MG tablet Take 4 mg by mouth at bedtime.    Marland Kitchen amoxicillin (AMOXIL) 500 MG tablet Take 2,000 mg by mouth See admin instructions. Take 4 capsules (2000 mg) by mouth 1 hour prior to dental appointments.    . ANDROGEL PUMP 20.25 MG/ACT (1.62%) GEL Apply 3 Pump topically 2 (two) times daily.   0  . Brinzolamide-Brimonidine (SIMBRINZA) 1-0.2 % SUSP Place 1 drop into both eyes 2 (two) times daily as needed (dry eyes).     . cromolyn (OPTICROM) 4 % ophthalmic solution Place 1 drop into both eyes 2 (two) times daily.     . cycloSPORINE (RESTASIS) 0.05 % ophthalmic emulsion Place 1 drop into both eyes 2 (two) times daily.    . dorzolamide-timolol (COSOPT) 22.3-6.8 MG/ML ophthalmic solution Place 1 drop into both eyes 2 (two) times daily.    Marland Kitchen doxycycline (VIBRAMYCIN) 100 MG capsule Take 100 mg by mouth 2 (two) times daily as needed (for tick bites). Take for 7 days    . fluticasone (FLONASE) 50 MCG/ACT nasal spray Place 2 sprays into both nostrils at bedtime as needed for allergies.     . methocarbamol (ROBAXIN) 500 MG tablet TAKE 1 TABLET BY MOUTH 2 TIMES DAILY AS NEEDED FOR MUSCLE SPASMS. (Patient not taking: Reported on 11/29/2019) 30 tablet 0  . oxyCODONE (OXY IR/ROXICODONE) 5 MG immediate release tablet Take 1-2 tablets (5-10 mg total) by mouth every 6 (six) hours as needed for moderate pain (pain  score 4-6). (Patient not taking: Reported on 11/29/2019) 56 tablet 0  . traMADol (ULTRAM) 50 MG tablet Take 1 tablet (50 mg total) by mouth every 6 (six) hours as needed for moderate pain. (Patient not taking: Reported on 11/29/2019) 40 tablet 0     Physical Exam: Right shoulder demonstrates painful and guarded motion as noted at recent office visits.  Strength is globally decreased.  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

## 2019-12-13 ENCOUNTER — Encounter (HOSPITAL_COMMUNITY): Payer: Self-pay | Admitting: Orthopedic Surgery

## 2019-12-13 NOTE — Anesthesia Postprocedure Evaluation (Signed)
Anesthesia Post Note  Patient: Jason Friedman.  Procedure(s) Performed: REVERSE SHOULDER ARTHROPLASTY (Right Shoulder)     Patient location during evaluation: PACU Anesthesia Type: Regional and General Level of consciousness: awake and alert Pain management: pain level controlled Vital Signs Assessment: post-procedure vital signs reviewed and stable Respiratory status: spontaneous breathing, nonlabored ventilation, respiratory function stable and patient connected to nasal cannula oxygen Cardiovascular status: blood pressure returned to baseline and stable Postop Assessment: no apparent nausea or vomiting Anesthetic complications: no   No complications documented.  Last Vitals:  Vitals:   12/11/19 1500 12/11/19 1632  BP: 130/79 124/88  Pulse: 73 89  Resp: 15   Temp: 36.4 C 36.4 C  SpO2: 93% 98%    Last Pain:  Vitals:   12/12/19 0931  TempSrc:   PainSc: 0-No pain                 Amen Dargis

## 2019-12-17 DIAGNOSIS — Z4789 Encounter for other orthopedic aftercare: Secondary | ICD-10-CM | POA: Diagnosis not present

## 2019-12-17 DIAGNOSIS — M25511 Pain in right shoulder: Secondary | ICD-10-CM | POA: Diagnosis not present

## 2020-02-27 DIAGNOSIS — Z96641 Presence of right artificial hip joint: Secondary | ICD-10-CM | POA: Diagnosis not present

## 2020-02-27 DIAGNOSIS — M25561 Pain in right knee: Secondary | ICD-10-CM | POA: Diagnosis not present

## 2020-03-07 DIAGNOSIS — M179 Osteoarthritis of knee, unspecified: Secondary | ICD-10-CM | POA: Diagnosis not present

## 2020-03-07 DIAGNOSIS — G894 Chronic pain syndrome: Secondary | ICD-10-CM | POA: Diagnosis not present

## 2020-03-07 DIAGNOSIS — E559 Vitamin D deficiency, unspecified: Secondary | ICD-10-CM | POA: Diagnosis not present

## 2020-03-07 DIAGNOSIS — E039 Hypothyroidism, unspecified: Secondary | ICD-10-CM | POA: Diagnosis not present

## 2020-03-07 DIAGNOSIS — E538 Deficiency of other specified B group vitamins: Secondary | ICD-10-CM | POA: Diagnosis not present

## 2020-03-07 DIAGNOSIS — R413 Other amnesia: Secondary | ICD-10-CM | POA: Diagnosis not present

## 2020-03-07 DIAGNOSIS — G4733 Obstructive sleep apnea (adult) (pediatric): Secondary | ICD-10-CM | POA: Diagnosis not present

## 2020-03-07 DIAGNOSIS — R270 Ataxia, unspecified: Secondary | ICD-10-CM | POA: Diagnosis not present

## 2020-03-13 DIAGNOSIS — M1711 Unilateral primary osteoarthritis, right knee: Secondary | ICD-10-CM | POA: Diagnosis not present

## 2020-03-17 DIAGNOSIS — Z96611 Presence of right artificial shoulder joint: Secondary | ICD-10-CM | POA: Diagnosis not present

## 2020-03-17 DIAGNOSIS — Z471 Aftercare following joint replacement surgery: Secondary | ICD-10-CM | POA: Diagnosis not present

## 2020-03-18 DIAGNOSIS — G894 Chronic pain syndrome: Secondary | ICD-10-CM | POA: Diagnosis not present

## 2020-03-18 DIAGNOSIS — E538 Deficiency of other specified B group vitamins: Secondary | ICD-10-CM | POA: Diagnosis not present

## 2020-03-18 DIAGNOSIS — G4733 Obstructive sleep apnea (adult) (pediatric): Secondary | ICD-10-CM | POA: Diagnosis not present

## 2020-03-18 DIAGNOSIS — M179 Osteoarthritis of knee, unspecified: Secondary | ICD-10-CM | POA: Diagnosis not present

## 2020-03-18 DIAGNOSIS — E039 Hypothyroidism, unspecified: Secondary | ICD-10-CM | POA: Diagnosis not present

## 2020-03-18 DIAGNOSIS — R413 Other amnesia: Secondary | ICD-10-CM | POA: Diagnosis not present

## 2020-03-18 DIAGNOSIS — R270 Ataxia, unspecified: Secondary | ICD-10-CM | POA: Diagnosis not present

## 2020-03-18 DIAGNOSIS — E559 Vitamin D deficiency, unspecified: Secondary | ICD-10-CM | POA: Diagnosis not present

## 2020-04-01 NOTE — Patient Instructions (Addendum)
DUE TO COVID-19 ONLY ONE VISITOR IS ALLOWED TO COME WITH YOU AND STAY IN THE WAITING ROOM ONLY DURING PRE OP AND PROCEDURE DAY OF SURGERY. THE 1 VISITOR  MAY VISIT WITH YOU AFTER SURGERY IN YOUR PRIVATE ROOM DURING VISITING HOURS ONLY!  YOU NEED TO HAVE A COVID 19 TEST ON    12/16___ @_11 :00______, THIS TEST MUST BE DONE BEFORE SURGERY,  COVID TESTING SITE [address] [place] , IT IS ON THE RIGHT GOING OUT [address] APPROXIMATELY  2 MINUTES PAST [employer] ON THE RIGHT. ONCE YOUR COVID TEST IS COMPLETED,  PLEASE BEGIN THE QUARANTINE INSTRUCTIONS AS OUTLINED IN YOUR HANDOUT.                [id number].    Your procedure is scheduled on: 04/14/20   Report to [facility] Main  Entrance   Report to Short stay  at   5:50 AM     Call this number if you have problems the morning of surgery [phone]    BRUSH YOUR TEETH MORNING OF SURGERY AND RINSE YOUR MOUTH OUT, NO CHEWING GUM CANDY OR MINTS.   No food after midnight.    You may have clear liquid until 4:30 AM.    At 4:00 AM drink pre surgery drink  . Nothing by mouth after 4:30 AM.    Take these medicines the morning of surgery with A SIP OF WATER: Amlodipine, Memantine, Levothyroxine,  Use your eye drops as usual Bring your mask and tubing to the hospital                                 You may not have any metal on your body including              piercings  Do not wear jewelry,  lotions, powders or deodorant                        Men may shave face and neck.   Do not bring valuables to the hospital. [facility] IS NOT             RESPONSIBLE   FOR VALUABLES.  Contacts, dentures or bridgework may not be worn into surgery.                  Please read over the following fact sheets you were given: _____________________________________________________________________             [facility] - Preparing for Surgery Before surgery, you can play an important role.    Because skin is not sterile, your skin needs to be as free of germs as possible.   You can reduce the number of germs on your skin by washing with CHG (chlorahexidine gluconate) soap before surgery .  CHG is an antiseptic cleaner which kills germs and bonds with the skin to continue killing germs even after washing. Please DO NOT use if you have an allergy to CHG or antibacterial soaps.   If your skin becomes reddened/irritated stop using the CHG and inform your nurse when you arrive at Short Stay.  You may shave your face/neck.  Please follow these instructions carefully:  1.  Shower with CHG Soap the night before surgery and the  morning of Surgery.  2.  If you choose to wash your hair, wash your hair first as  usual with your  normal  shampoo.  3.  After you shampoo, rinse your hair and body thoroughly to remove the  shampoo.                                        4.  Use CHG as you would any other liquid soap.  You can apply chg directly  to the skin and wash                       Gently with a scrungie or clean washcloth.  5.  Apply the CHG Soap to your body ONLY FROM THE NECK DOWN.   Do not use on face/ open                           Wound or open sores. Avoid contact with eyes, ears mouth and genitals (private parts).                       Wash face,  Genitals (private parts) with your normal soap.             6.  Wash thoroughly, paying special attention to the area where your surgery  will be performed.  7.  Thoroughly rinse your body with warm water from the neck down.  8.  DO NOT shower/wash with your normal soap after using and rinsing off  the CHG Soap.             9.  Pat yourself dry with a clean towel.            10.  Wear clean pajamas.            11.  Place clean sheets on your bed the night of your first shower and do not  sleep with pets. Day of Surgery : Do not apply any lotions/deodorants the morning of surgery.  Please wear clean clothes to the hospital/surgery  center.  FAILURE TO FOLLOW THESE INSTRUCTIONS MAY RESULT IN THE CANCELLATION OF YOUR SURGERY PATIENT SIGNATURE_________________________________  NURSE SIGNATURE__________________________________  ________________________________________________________________________   Jason Friedman  An incentive spirometer is a tool that can help keep your lungs clear and active. This tool measures how well you are filling your lungs with each breath. Taking long deep breaths may help reverse or decrease the chance of developing breathing (pulmonary) problems (especially infection) following:  A long period of time when you are unable to move or be active. BEFORE THE PROCEDURE   If the spirometer includes an indicator to show your best effort, your nurse or respiratory therapist will set it to a desired goal.  If possible, sit up straight or lean slightly forward. Try not to slouch.  Hold the incentive spirometer in an upright position. INSTRUCTIONS FOR USE  1. Sit on the edge of your bed if possible, or sit up as far as you can in bed or on a chair. 2. Hold the incentive spirometer in an upright position. 3. Breathe out normally. 4. Place the mouthpiece in your mouth and seal your lips tightly around it. 5. Breathe in slowly and as deeply as possible, raising the piston or the ball toward the top of the column. 6. Hold your breath for 3-5 seconds or for as long as possible. Allow the piston or ball to fall to  the bottom of the column. 7. Remove the mouthpiece from your mouth and breathe out normally. 8. Rest for a few seconds and repeat Steps 1 through 7 at least 10 times every 1-2 hours when you are awake. Take your time and take a few normal breaths between deep breaths. 9. The spirometer may include an indicator to show your best effort. Use the indicator as a goal to work toward during each repetition. 10. After each set of 10 deep breaths, practice coughing to be sure your lungs are  clear. If you have an incision (the cut made at the time of surgery), support your incision when coughing by placing a pillow or rolled up towels firmly against it. Once you are able to get out of bed, walk around indoors and cough well. You may stop using the incentive spirometer when instructed by your caregiver.  RISKS AND COMPLICATIONS  Take your time so you do not get dizzy or light-headed.  If you are in pain, you may need to take or ask for pain medication before doing incentive spirometry. It is harder to take a deep breath if you are having pain. AFTER USE  Rest and breathe slowly and easily.  It can be helpful to keep track of a log of your progress. Your caregiver can provide you with a simple table to help with this. If you are using the spirometer at home, follow these instructions: SEEK MEDICAL CARE IF:   You are having difficultly using the spirometer.  You have trouble using the spirometer as often as instructed.  Your pain medication is not giving enough relief while using the spirometer.  You develop fever of 100.5 F (38.1 C) or higher. SEEK IMMEDIATE MEDICAL CARE IF:   You cough up bloody sputum that had not been present before.  You develop fever of 102 F (38.9 C) or greater.  You develop worsening pain at or near the incision site. MAKE SURE YOU:   Understand these instructions.  Will watch your condition.  Will get help right away if you are not doing well or get worse. Document Released: 08/23/2006 Document Revised: 07/05/2011 Document Reviewed: 10/24/2006 [facility] Patient Information 2014 [place], [place].   ________________________________________________________________________

## 2020-04-03 ENCOUNTER — Other Ambulatory Visit: Payer: Self-pay

## 2020-04-03 ENCOUNTER — Encounter (HOSPITAL_COMMUNITY): Payer: Self-pay

## 2020-04-03 ENCOUNTER — Encounter (HOSPITAL_COMMUNITY)
Admission: RE | Admit: 2020-04-03 | Discharge: 2020-04-03 | Disposition: A | Discharge status: Home / Self Care | Payer: Medicare PPO | Source: Ambulatory Visit | Attending: Orthopedic Surgery | Admitting: Orthopedic Surgery

## 2020-04-03 DIAGNOSIS — I10 Essential (primary) hypertension: Secondary | ICD-10-CM | POA: Diagnosis not present

## 2020-04-03 DIAGNOSIS — Z79899 Other long term (current) drug therapy: Secondary | ICD-10-CM | POA: Insufficient documentation

## 2020-04-03 DIAGNOSIS — Z79890 Hormone replacement therapy (postmenopausal): Secondary | ICD-10-CM | POA: Insufficient documentation

## 2020-04-03 DIAGNOSIS — J449 Chronic obstructive pulmonary disease, unspecified: Secondary | ICD-10-CM | POA: Insufficient documentation

## 2020-04-03 DIAGNOSIS — E039 Hypothyroidism, unspecified: Secondary | ICD-10-CM | POA: Diagnosis not present

## 2020-04-03 DIAGNOSIS — M1711 Unilateral primary osteoarthritis, right knee: Secondary | ICD-10-CM | POA: Diagnosis not present

## 2020-04-03 DIAGNOSIS — Z01812 Encounter for preprocedural laboratory examination: Secondary | ICD-10-CM | POA: Diagnosis not present

## 2020-04-03 LAB — COMPREHENSIVE METABOLIC PANEL
ALT: 19 U/L (ref 0–44)
AST: 34 U/L (ref 15–41)
Albumin: 4.2 g/dL (ref 3.5–5.0)
Alkaline Phosphatase: 59 U/L (ref 38–126)
Anion gap: 10 (ref 5–15)
BUN: 17 mg/dL (ref 8–23)
CO2: 25 mmol/L (ref 22–32)
Calcium: 9.2 mg/dL (ref 8.9–10.3)
Chloride: 104 mmol/L (ref 98–111)
Creatinine, Ser: 0.9 mg/dL (ref 0.61–1.24)
GFR, Estimated: >60 mL/min (ref >60)
Glucose, Bld: 102 mg/dL — ABNORMAL HIGH (ref 70–99)
Potassium: 4 mmol/L (ref 3.5–5.1)
Sodium: 139 mmol/L (ref 135–145)
Total Bilirubin: 0.6 mg/dL (ref 0.3–1.2)
Total Protein: 6.9 g/dL (ref 6.5–8.1)

## 2020-04-03 LAB — TYPE AND SCREEN
ABO/RH(D): A POS
Antibody Screen: NEGATIVE

## 2020-04-03 LAB — SURGICAL PCR SCREEN
MRSA, PCR: NEGATIVE
Staphylococcus aureus: NEGATIVE

## 2020-04-03 LAB — CBC
HCT: 42.9 % (ref 39.0–52.0)
Hemoglobin: 14.2 g/dL (ref 13.0–17.0)
MCH: 33.1 pg (ref 26.0–34.0)
MCHC: 33.1 g/dL (ref 30.0–36.0)
MCV: 100 fL (ref 80.0–100.0)
Platelets: 380 10*3/uL (ref 150–400)
RBC: 4.29 MIL/uL (ref 4.22–5.81)
RDW: 14.1 % (ref 11.5–15.5)
WBC: 7.4 10*3/uL (ref 4.0–10.5)
nRBC: 0 % (ref 0.0–0.2)

## 2020-04-03 LAB — PROTIME-INR
INR: 1.1 (ref 0.8–1.2)
Prothrombin Time: 13.4 seconds (ref 11.4–15.2)

## 2020-04-03 LAB — APTT: aPTT: 93 seconds — ABNORMAL HIGH (ref 24–36)

## 2020-04-03 NOTE — Progress Notes (Signed)
COVID Vaccine Completed:Yes Date COVID Vaccine completed:06/06/19 Booster 03/05/20 COVID vaccine manufacturer: Pfizer      PCP - Dr. Wende Neighbors Cardiologist - none  Chest x-ray - no EKG - 12/07/19-Epic Stress Test - no ECHO - 05/19/16-Epic Cardiac Cath - no Pacemaker/ICD device last checked:NA  Sleep Study - yes CPAP -No. Pt can't tolerate the mask   Fasting Blood Sugar - NA Checks Blood Sugar _____ times a day  Blood Thinner Instructions:NA Aspirin Instructions: Last Dose:  Anesthesia review:   Patient denies shortness of breath, fever, cough and chest pain at PAT appointment yes  Patient verbalized understanding of instructions that were given to them at the PAT appointment. Patient was also instructed that they will need to review over the PAT instructions again at home before surgery. Yes. Pt doesn't climb stairs but has no SOB working outside or with ADLs He has a plate in the neck with very limites ROM and a history of difficult intubation in the 1980s but no problems with recent surgeries

## 2020-04-08 NOTE — Progress Notes (Signed)
Anesthesia Chart Review   Case: [id number] Date/Time: 04/14/20 0805   Procedure: TOTAL KNEE ARTHROPLASTY (Right Knee) - 68mn   Anesthesia type: Spinal   Pre-op diagnosis: right knee osteoarthritis   [facility]: [facility]OM 10 / [facility]   Surgeons: AGaynelle Arabian MD      DISCUSSION:79 y.o. never smoker with h/o PONV, hypothyroidism, HTN, COPD, s/p C4-6 posterior cervical fusion, right knee OA scheduled for above procedure 04/14/2020 with Dr. FGaynelle Arabian   Per previous anesthesia chart review, "Notes indicate patient reported prior history of abnormal EKG (possibly due elevated right hemidiaphragm with "rotated" heart). He was referred to cardiologist Dr. JMinus Breedingin 04/2016 due to abnormal EKG. At that time he reported good exercise tolerance without CV symptoms, soPRN follow-up recommended pending echo results (see below)."  Anesthesia records re: DIFFICULT AIRWAY  - 03/15/18: IV induction, mask ventilation without difficulty. Grade I. Glidescope and 4. Ridgid stylet and Video-laryngoscopy. Oral-tracheal intubation. 1 attempt. 8.0 mm ETT. Difficulty anticipated due to history and neck instability.Comments:Elective glidescope per surgeon due to cervical myelopathy.  - 04/07/17:IV induction, mask ventilation without difficulty. Grade I. Glidescope and 3. Stylet and Oral airway. Oral-tracheal intubation. 2 attempts. 7.5 mm ETT. Difficulty anticipated due to history and anterior larynx.Future Recommendations:Recommend- induction with short-acting agent, and alternative techniques readily available Comments:DL x1 Mil 3 by SRNA, grade 4 view, DL x1 Mil 3 by CRNA, grade 4 view, Glide 3 x1, grade 1 view, ATOI. +BBS/etCO2.  - 08/23/11:Difficult airway?: (c) Yes Grade View: Grade 3 Placed By: Self;CRNA Airway Device: Endotracheal Tube Laryngoscope Blade: MAC;4 ETT Types: Oral Size (mm): 7.5 mm Cuffed: Cuffed   History of difficult airway, butsuccessful intubationswith Glidescope. He  denied SOB, cough, fever, chest pain.PFTs stable at last pulmonary visitlastyear. EKG appears stable."  S/p C4-C6 posterior cervical fusion.  Per anesthesia notes: IV induction, mask ventilation without difficulty. Grade 1.  Glidescope and 4. Oral-tracheal intubation, tube size 7.512mwith 1 attempt, video-layrngoscopy used.  Difficulty was anticipated and difficult airway-due to reduced neck mobility.  4x4 bite block used.   Most recent procedure 12/11/2019. Per airway note, "Difficult ariway-due to reduced neck mobility. Elective Glidescope intubation due to multiple cervical neck fusions and decreased neck range of motion."  Anticipate pt can proceed with planned procedure barring acute status change.   VS: BP (!) 155/88   Temp 36.7 C (Oral)   Resp 20   Ht _0  (1.753 m)   Wt 84.4 kg   SpO2 98%   BMI 27.47 kg/m   PROVIDERS: GaJosetta HuddleMD is PCP    LABS: Labs reviewed: Acceptable for surgery. (all labs ordered are listed, but only abnormal results are displayed)  Labs Reviewed  COMPREHENSIVE METABOLIC PANEL - Abnormal; Notable for the following components:      Result Value   Glucose, Bld 102 (*)    All other components within normal limits  APTT - Abnormal; Notable for the following components:   aPTT 93 (*)    All other components within normal limits  SURGICAL PCR SCREEN  CBC  PROTIME-INR  TYPE AND SCREEN     IMAGES:   EKG: 12/07/2019 Rate 62 bpm  NSR Poor anterior R wave progression No significant change since last tracing   CV: Echo 05/19/2016 Study Conclusions   - Left ventricle: The cavity size was normal. Wall thickness was  normal. Systolic function was normal. The estimated ejection  fraction was in the range of 55% to 60%. Wall motion was normal;  there  were no regional wall motion abnormalities. Doppler  parameters are consistent with abnormal left ventricular  relaxation (grade 1 diastolic dysfunction).  - Aortic valve: There  was no stenosis.  - Aorta: Borderline dilated aortic root. Aortic root dimension: 37  mm (ED).  - Mitral valve: There was no significant regurgitation.  - Right ventricle: The cavity size was normal. Systolic function  was normal.  - Pulmonary arteries: No complete TR doppler jet so unable to  estimate PA systolic pressure.  - Inferior vena cava: The vessel was normal in size. The  respirophasic diameter changes were in the normal range (= 50%),  consistent with normal central venous pressure.   Impressions:   - Normal LV size with EF 55-60%. Normal RV size and systolic  function. No significant valvular abnormalities.  Past Medical History:  Diagnosis Date  . Anemia   . Arthritis   . Cervical spinal stenosis   . Cervical spine fracture ([place]) [address]10  . Cervical vertebral fracture (HCC) 25 yrs ago   C 5   . Chronically elevated hemidiaphragm    Right  . 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 COPD (chronic obstructive pulmonary disease) ([place])    mild copd per lov note dr Raul Del 04-16-11 on chart  . Difficult intubation 1980s   difficult d/t anterior larynx, successful intubation using Glidescope 04/07/17  . Fracture of cervical vertebrae, multiple (HCC)  in high school   2 cervical vertebrae fx, 3 dislocated cervical vertebrae  . Grade I diastolic dysfunction    noted on ECHO  . Heart abnormality    heart anatomicall rotated, causes abnormal ekg  . Hypertension    hx on, none current  . Hypogonadism in male   . Hypothyroidism   . Impaired memory    x 2 yrs ago after neck injury  . Insomnia   . Paralysis, diaphragm    Left  . PONV (postoperative nausea and vomiting)    likes scopolamine patch, vertigo ,pt. reports that anesthesia "has had problem finding my airway"  . Prostate irregularity    nonfuctional prostate, on testerone gel bid  . Sigmoid diverticulitis   . Sleep apnea    does not use due to weight  loss, does not need now, not able to use CPAP, last sleep study- 2018    Past Surgical History:  Procedure Laterality Date  . ANTERIOR CERVICAL DECOMP/DISCECTOMY FUSION N/A 03/15/2018   Procedure: CERVICAL FOUR-FIVE, CERVICAL FIVE-SIX ANTERIOR CERVICAL DECOMPRESSION/DISCECTOMY FUSION ALLOGRAFT AND PLATE;  Surgeon: Marybelle Killings, MD;  Location: [place];  Service: Orthopedics;  Laterality: N/A;  CERVICAL FOUR-FIVE, CERVICAL FIVE-SIX ANTERIOR CERVICAL DECOMPRESSION/DISCECTOMY FUSION ALLOGRAFT AND PLATE  . CARPAL TUNNEL RELEASE Left    hand is now numb  . piliodional cyst  yrs ago   done x 3  . POSTERIOR CERVICAL FUSION/FORAMINOTOMY N/A 05/08/2018   Procedure: C4-C6 POSTERIOR CERVICAL FUSION, WIRING, ILIAC BONE MARROW ASPIRATE;  Surgeon: Marybelle Killings, MD;  Location: [place];  Service: Orthopedics;  Laterality: N/A;  . REVERSE SHOULDER ARTHROPLASTY Right 12/11/2019   Procedure: REVERSE SHOULDER ARTHROPLASTY;  Surgeon: Justice Britain, MD;  [facility];  Service: Orthopedics;  Laterality: Right;  123mn  . right shoulder arthroscopy  Mar 02, 2011  . TONSILLECTOMY    . TOTAL HIP ARTHROPLASTY  08/23/2011   Procedure: TOTAL HIP ARTHROPLASTY;  Surgeon: FGearlean Alf MD;  [facility];  Service: Orthopedics;  Laterality: Right;  . TOTAL  HIP ARTHROPLASTY Left 03/19/2019   Procedure: TOTAL HIP ARTHROPLASTY ANTERIOR APPROACH;  Surgeon: Gaynelle Arabian, MD;  [facility];  Service: Orthopedics;  Laterality: Left;  130mn  . TOTAL SHOULDER ARTHROPLASTY Left 04/07/2017   Procedure: LEFT TOTAL SHOULDER ARTHROPLASTY;  Surgeon: SJustice Britain MD;  [place]  Service: Orthopedics;  Laterality: Left;    MEDICATIONS: . amLODipine (NORVASC) 5 MG tablet  . amoxicillin (AMOXIL) 500 MG tablet  . ANDROGEL PUMP 20.25 MG/ACT (1.62%) GEL  . Brinzolamide-Brimonidine (SIMBRINZA) 1-0.2 % SUSP  . carboxymethylcellulose (REFRESH PLUS) 0.5 % SOLN  . cromolyn (OPTICROM) 4 % ophthalmic solution  .  cyclobenzaprine (FLEXERIL) 10 MG tablet  . donepezil (ARICEPT) 5 MG tablet  . dorzolamide-timolol (COSOPT) 22.3-6.8 MG/ML ophthalmic solution  . doxycycline (VIBRAMYCIN) 100 MG capsule  . fluticasone (FLONASE) 50 MCG/ACT nasal spray  . gabapentin (NEURONTIN) 100 MG capsule  . HYDROcodone-acetaminophen (NORCO) 7.5-325 MG tablet  . levothyroxine (SYNTHROID) 150 MCG tablet  . losartan (COZAAR) 50 MG tablet  . meclizine (ANTIVERT) 25 MG tablet  . memantine (NAMENDA) 5 MG tablet  . Multiple Vitamins-Minerals (MULTIVITAMIN WITH MINERALS) tablet  . naproxen (NAPROSYN) 500 MG tablet  . ondansetron (ZOFRAN) 4 MG tablet  . oxyCODONE-acetaminophen (PERCOCET) 5-325 MG tablet  . tiZANidine (ZANAFLEX) 4 MG tablet   No current facility-administered medications for this encounter.    JKonrad Felix PA-C [facility] ([phone]

## 2020-04-09 NOTE — H&P (Signed)
TOTAL KNEE ADMISSION H&P  Patient is being admitted for right total knee arthroplasty.  Subjective:  Chief Complaint:right knee pain.  HPI: Jason Friedman., 79 y.o. male, has a history of pain and functional disability in the right knee due to arthritis and has failed non-surgical conservative treatments for greater than 12 weeks to includeNSAID's and/or analgesics and activity modification.  Onset of symptoms was gradual, starting 2 years ago with gradually worsening course since that time. The patient noted no past surgery on the right knee(s).  Patient currently rates pain in the right knee(s) at 7 out of 10 with activity. Patient has worsening of pain with activity and weight bearing and pain that interferes with activities of daily living.  Patient has evidence of joint space narrowing by imaging studies.There is no active infection.  Patient Active Problem List   Diagnosis Date Noted  . Osteoarthritis of left hip 03/19/2019  . Cervical pseudoarthrosis (HCC) 05/08/2018  . Cervical stenosis of spine 03/15/2018  . Other spondylosis with myelopathy, cervical region 03/09/2018  . 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 OA (osteoarthritis) of hip 08/23/2011   Past Medical History:  Diagnosis Date  . Anemia   . Arthritis   . Cervical spinal stenosis   . Cervical spine fracture (H[facility]) 20010  . Cervical vertebral fracture (HCC) 25 yrs ago   C 5   . Chronically elevated hemidiaphragm    Right  . 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 COPD (chronic obstructive pulmonary disease) (H[facility])    mild copd per lov note dr Meredeth Ide 04-16-11 on chart  . Difficult intubation 1980s   difficult d/t anterior larynx, successful intubation using Glidescope 04/07/17  . Fracture of cervical vertebrae, multiple (HCC)  in high school   2 cervical vertebrae fx, 3 dislocated cervical vertebrae  . Grade I diastolic dysfunction    noted on  ECHO  . Heart abnormality    heart anatomicall rotated, causes abnormal ekg  . Hypertension    hx on, none current  . Hypogonadism in male   . Hypothyroidism   . Impaired memory    x 2 yrs ago after neck injury  . Insomnia   . Paralysis, diaphragm    Left  . PONV (postoperative nausea and vomiting)    likes scopolamine patch, vertigo ,pt. reports that anesthesia "has had problem finding my airway"  . Prostate irregularity    nonfuctional prostate, on testerone gel bid  . Sigmoid diverticulitis   . Sleep apnea    does not use due to weight loss, does not need now, not able to use CPAP, last sleep study- 2018    Past Surgical History:  Procedure Laterality Date  . ANTERIOR CERVICAL DECOMP/DISCECTOMY FUSION N/A 03/15/2018   Procedure: CERVICAL FOUR-FIVE, CERVICAL FIVE-SIX ANTERIOR CERVICAL DECOMPRESSION/DISCECTOMY FUSION ALLOGRAFT AND PLATE;  Surgeon: Eldred Manges, MD;  [facility];  Service: Orthopedics;  Laterality: N/A;  CERVICAL FOUR-FIVE, CERVICAL FIVE-SIX ANTERIOR CERVICAL DECOMPRESSION/DISCECTOMY FUSION ALLOGRAFT AND PLATE  . CARPAL TUNNEL RELEASE Left    hand is now numb  . piliodional cyst  yrs ago   done x 3  . POSTERIOR CERVICAL FUSION/FORAMINOTOMY N/A 05/08/2018   Procedure: C4-C6 POSTERIOR CERVICAL FUSION, WIRING, ILIAC BONE MARROW ASPIRATE;  Surgeon: Eldred Manges, MD;  [facility];  Service: Orthopedics;  Laterality: N/A;  . REVERSE SHOULDER ARTHROPLASTY Right 12/11/2019   Procedure: REVERSE SHOULDER ARTHROPLASTY;  Surgeon: Francena Hanly, MD;  [facility];  Service: Orthopedics;  Laterality: Right;  120min  . right shoulder arthroscopy  Mar 02, 2011  . TONSILLECTOMY    . TOTAL HIP ARTHROPLASTY  08/23/2011   Procedure: TOTAL HIP ARTHROPLASTY;  Surgeon: Loanne DrillingFrank V Aluisio, MD;  [facility];  Service: Orthopedics;  Laterality: Right;  . TOTAL HIP ARTHROPLASTY Left 03/19/2019   Procedure: TOTAL HIP ARTHROPLASTY ANTERIOR APPROACH;  Surgeon: Ollen GrossAluisio, Frank, MD;   [facility];  Service: Orthopedics;  Laterality: Left;  100min  . TOTAL SHOULDER ARTHROPLASTY Left 04/07/2017   Procedure: LEFT TOTAL SHOULDER ARTHROPLASTY;  Surgeon: Francena HanlySupple, Kevin, MD;  [facility];  Service: Orthopedics;  Laterality: Left;    No current facility-administered medications for this encounter.   Current Outpatient Medications  Medication Sig Dispense Refill Last Dose  . amLODipine (NORVASC) 5 MG tablet Take 5 mg by mouth every evening.      Marland Kitchen. amoxicillin (AMOXIL) 500 MG tablet Take 2,000 mg by mouth See admin instructions. Take 4 capsules (2000 mg) by mouth 1 hour prior to dental appointments.     . ANDROGEL PUMP 20.25 MG/ACT (1.62%) GEL Apply 2.5 Pump topically 2 (two) times daily.   0   . Brinzolamide-Brimonidine (SIMBRINZA) 1-0.2 % SUSP Place 1 drop into both eyes 2 (two) times daily as needed (dry eyes).      . carboxymethylcellulose (REFRESH PLUS) 0.5 % SOLN Place 1 drop into both eyes 3 (three) times daily as needed (dry eyes).     . cromolyn (OPTICROM) 4 % ophthalmic solution Place 1 drop into both eyes 2 (two) times daily.      Marland Kitchen. donepezil (ARICEPT) 5 MG tablet Take 5 mg by mouth at bedtime.     . dorzolamide-timolol (COSOPT) 22.3-6.8 MG/ML ophthalmic solution Place 1 drop into both eyes 2 (two) times daily.     Marland Kitchen. doxycycline (VIBRAMYCIN) 100 MG capsule Take 100 mg by mouth 2 (two) times daily as needed (for tick bites). Take for 7 days     . fluticasone (FLONASE) 50 MCG/ACT nasal spray Place 2 sprays into both nostrils at bedtime as needed for allergies.      Marland Kitchen. gabapentin (NEURONTIN) 100 MG capsule Take 100 mg by mouth every morning.      Marland Kitchen. HYDROcodone-acetaminophen (NORCO) 7.5-325 MG tablet Take 1 tablet by mouth every 6 (six) hours as needed for moderate pain.     Marland Kitchen. levothyroxine (SYNTHROID) 150 MCG tablet Take 150 mcg by mouth daily before breakfast.   3   . losartan (COZAAR) 50 MG tablet Take 50 mg by mouth every evening.      . meclizine (ANTIVERT) 25 MG  tablet Take 25 mg by mouth every morning.      . memantine (NAMENDA) 5 MG tablet Take 5 mg by mouth 2 (two) times daily.     . Multiple Vitamins-Minerals (MULTIVITAMIN WITH MINERALS) tablet Take 1 tablet by mouth daily.     Marland Kitchen. tiZANidine (ZANAFLEX) 4 MG tablet Take 4 mg by mouth at bedtime.     . cyclobenzaprine (FLEXERIL) 10 MG tablet Take 1 tablet (10 mg total) by mouth 3 (three) times daily as needed for muscle spasms. (Patient not taking: Reported on 03/28/2020) 30 tablet 1 Not Taking at Unknown time  . naproxen (NAPROSYN) 500 MG tablet Take 1 tablet (500 mg total) by mouth 2 (two) times daily with a meal. (Patient not taking: Reported on 03/28/2020) 60 tablet 1 Not Taking at Unknown time  . ondansetron (ZOFRAN) 4 MG  tablet Take 1 tablet (4 mg total) by mouth every 8 (eight) hours as needed for nausea or vomiting. (Patient not taking: Reported on 03/28/2020) 10 tablet 0 Not Taking at Unknown time  . oxyCODONE-acetaminophen (PERCOCET) 5-325 MG tablet Take 1 tablet by mouth every 4 (four) hours as needed (max 6 q). (Patient not taking: Reported on 03/28/2020) 20 tablet 0 Not Taking at Unknown time   Allergies  Allergen Reactions  . Duloxetine Other (See Comments)    Altered personality - didn't eat, pain all over  . Cefuroxime Axetil [Ceftin] Other (See Comments)    Joint pain  . Gabapentin Other (See Comments)    Visual disturbance, hair loss  . Shellfish Allergy Swelling    SWELLING REACTION UNSPECIFIED   . Sulfa Antibiotics Other (See Comments)    UNSPECIFIED REACTION   . Latex Other (See Comments)    Skin irritation     Social History   Tobacco Use  . Smoking status: Never Smoker  . Smokeless tobacco: Never Used  Substance Use Topics  . Alcohol use: Yes    Alcohol/week: 2.0 - 3.0 standard drinks    Types: 2 - 3 Cans of beer per week    Comment: rare use of whiskey- q 6 months     Family History  Problem Relation Age of Onset  . Breast cancer Mother   . Prostate cancer Father    . Diabetes Sister   . Breast cancer Sister   . Diabetes Sister   . Colon cancer Sister      Review of Systems  Constitutional: Negative for chills and fever.  Respiratory: Negative for cough and shortness of breath.   Cardiovascular: Negative for chest pain.  Gastrointestinal: Negative for nausea and vomiting.  Musculoskeletal: Positive for arthralgias.    Objective:  Physical Exam Patient is a 79 year old male.  Well nourished and well developed. General: Alert and oriented x3, cooperative and pleasant, no acute distress. Head: normocephalic, atraumatic, neck supple. Eyes: EOMI. Respiratory: breath sounds clear in all fields, no wheezing, rales, or rhonchi. Cardiovascular: Regular rate and rhythm, no murmurs, gallops or rubs. Musculoskeletal:  Right Knee Exam: Significant valgus deformity. No effusion present. No swelling present. The range of motion is: 5 degrees of hyperextension to 125 degrees of flexion. Marked crepitus on range of motion of the knee. Positive lateral greater than medial joint line tenderness. Significant instability with varus stressing. Significant AP laxity.  Calves soft and nontender. Motor function intact in LE. Strength 5/5 LE bilaterally. Neuro: Distal pulses 2+. Sensation to light touch intact in LE.  Vital signs in last 24 hours:    Labs:   Estimated body mass index is 27.47 kg/m as calculated from the following:   Height as of 04/03/20: 5\' 9"  (1.753 m).   Weight as of 04/03/20: 84.4 kg.   Imaging Review Plain radiographs demonstrate severe degenerative joint disease of the right knee(s). The overall alignment isneutral. The bone quality appears to be adequate for age and reported activity level.      Assessment/Plan:  End stage arthritis, right knee   The patient history, physical examination, clinical judgment of the provider and imaging studies are consistent with end stage degenerative joint disease of the right knee(s)  and total knee arthroplasty is deemed medically necessary. The treatment options including medical management, injection therapy arthroscopy and arthroplasty were discussed at length. The risks and benefits of total knee arthroplasty were presented and reviewed. The risks due to aseptic loosening, infection, stiffness, patella  tracking problems, thromboembolic complications and other imponderables were discussed. The patient acknowledged the explanation, agreed to proceed with the plan and consent was signed. Patient is being admitted for inpatient treatment for surgery, pain control, PT, OT, prophylactic antibiotics, VTE prophylaxis, progressive ambulation and ADL's and discharge planning. The patient is planning to be discharged home.  Therapy Plans: HEP, okay per Dr. Lequita Halt Disposition: Home with wife Planned DVT Prophylaxis: aspirin 325mg  BID DME needed: none PCP: Dr. TXA: IV Allergies: Gabapentin - hair loss , shellfish -swelling , sulfa - swelling Anesthesia Concerns: reports he 'stopped breathing once' BMI: 26.2 Not diabetic.  Other: Takes Norco 7.5, 0-3 tablets daily. Tolerates oxycodone well.   Patient's anticipated LOS is less than 2 midnights, meeting these requirements: - Lives within 1 hour of care - Has a competent adult at home to recover with post-op recover - NO history of  - Chronic pain requiring opiods  - Diabetes  - Coronary Artery Disease  - Heart failure  - Heart attack  - Stroke  - DVT/VTE  - Cardiac arrhythmia  - Respiratory Failure/COPD  - Renal failure  - Anemia  - Advanced Liver disease    - Patient was instructed on what medications to stop prior to surgery. - Follow-up visit in 2 weeks with Dr. Kevan Ny - Begin physical therapy following surgery - Pre-operative lab work as pre-surgical testing - Prescriptions will be provided in hospital at time of discharge  Lequita Halt, PA-C Orthopedic Surgery [facility] [place] [phone]

## 2020-04-10 ENCOUNTER — Other Ambulatory Visit (HOSPITAL_COMMUNITY)
Admission: RE | Admit: 2020-04-10 | Discharge: 2020-04-10 | Disposition: A | Discharge status: Home / Self Care | Payer: Medicare PPO | Source: Ambulatory Visit | Attending: Orthopedic Surgery | Admitting: Orthopedic Surgery

## 2020-04-10 DIAGNOSIS — Z01812 Encounter for preprocedural laboratory examination: Secondary | ICD-10-CM | POA: Insufficient documentation

## 2020-04-10 DIAGNOSIS — Z20822 Contact with and (suspected) exposure to covid-19: Secondary | ICD-10-CM | POA: Diagnosis not present

## 2020-04-10 LAB — SARS CORONAVIRUS 2 (TAT 6-24 HRS): SARS Coronavirus 2: NEGATIVE

## 2020-04-13 MED ORDER — BUPIVACAINE LIPOSOME 1.3 % IJ SUSP
20.0000 mL | Freq: Once | INTRAMUSCULAR | Status: DC
  Filled 2020-04-13: qty 20

## 2020-04-14 ENCOUNTER — Ambulatory Visit (HOSPITAL_COMMUNITY): Payer: Medicare PPO | Admitting: Physician Assistant

## 2020-04-14 ENCOUNTER — Ambulatory Visit (HOSPITAL_COMMUNITY): Payer: Medicare PPO | Admitting: Certified Registered Nurse Anesthetist

## 2020-04-14 ENCOUNTER — Observation Stay (HOSPITAL_COMMUNITY)
Admission: RE | Admit: 2020-04-14 | Discharge: 2020-04-15 | Disposition: A | Discharge status: Home / Self Care | Payer: Medicare PPO | Attending: Orthopedic Surgery | Admitting: Orthopedic Surgery

## 2020-04-14 ENCOUNTER — Encounter (HOSPITAL_COMMUNITY): Payer: Self-pay | Admitting: Orthopedic Surgery

## 2020-04-14 ENCOUNTER — Encounter (HOSPITAL_COMMUNITY)
Admission: RE | Disposition: A | Discharge status: Home / Self Care | Payer: Self-pay | Source: Home / Self Care | Attending: Orthopedic Surgery

## 2020-04-14 ENCOUNTER — Other Ambulatory Visit: Payer: Self-pay

## 2020-04-14 DIAGNOSIS — J449 Chronic obstructive pulmonary disease, unspecified: Secondary | ICD-10-CM | POA: Diagnosis not present

## 2020-04-14 DIAGNOSIS — Z91040 Latex allergy status: Secondary | ICD-10-CM | POA: Diagnosis not present

## 2020-04-14 DIAGNOSIS — Z96643 Presence of artificial hip joint, bilateral: Secondary | ICD-10-CM | POA: Insufficient documentation

## 2020-04-14 DIAGNOSIS — M179 Osteoarthritis of knee, unspecified: Secondary | ICD-10-CM | POA: Diagnosis present

## 2020-04-14 DIAGNOSIS — Z96612 Presence of left artificial shoulder joint: Secondary | ICD-10-CM | POA: Diagnosis not present

## 2020-04-14 DIAGNOSIS — E039 Hypothyroidism, unspecified: Secondary | ICD-10-CM | POA: Insufficient documentation

## 2020-04-14 DIAGNOSIS — G8918 Other acute postprocedural pain: Secondary | ICD-10-CM | POA: Diagnosis not present

## 2020-04-14 DIAGNOSIS — Z79899 Other long term (current) drug therapy: Secondary | ICD-10-CM | POA: Diagnosis not present

## 2020-04-14 DIAGNOSIS — M1711 Unilateral primary osteoarthritis, right knee: Principal | ICD-10-CM | POA: Diagnosis present

## 2020-04-14 DIAGNOSIS — D649 Anemia, unspecified: Secondary | ICD-10-CM | POA: Diagnosis not present

## 2020-04-14 DIAGNOSIS — I10 Essential (primary) hypertension: Secondary | ICD-10-CM | POA: Diagnosis not present

## 2020-04-14 DIAGNOSIS — M25561 Pain in right knee: Secondary | ICD-10-CM | POA: Diagnosis present

## 2020-04-14 HISTORY — PX: TOTAL KNEE ARTHROPLASTY: SHX125

## 2020-04-14 SURGERY — ARTHROPLASTY, KNEE, TOTAL
Anesthesia: Spinal | Site: Knee | Laterality: Right

## 2020-04-14 MED ORDER — MENTHOL 3 MG MT LOZG: 1.0000 | LOZENGE | OROMUCOSAL | Status: DC | PRN

## 2020-04-14 MED ORDER — DONEPEZIL HCL 10 MG PO TABS
5.0000 mg | ORAL_TABLET | Freq: Every day | ORAL | Status: DC
  Administered 2020-04-14: 21:00:00 5 mg via ORAL
  Filled 2020-04-14: qty 1

## 2020-04-14 MED ORDER — POLYETHYLENE GLYCOL 3350 17 G PO PACK: 17.0000 g | PACK | Freq: Every day | ORAL | Status: DC | PRN

## 2020-04-14 MED ORDER — DEXAMETHASONE SODIUM PHOSPHATE 4 MG/ML IJ SOLN
INTRAMUSCULAR | Status: DC | PRN
  Administered 2020-04-14: 8 mg via INTRAVENOUS

## 2020-04-14 MED ORDER — LACTATED RINGERS IV SOLN: INTRAVENOUS | Status: DC

## 2020-04-14 MED ORDER — DEXAMETHASONE SODIUM PHOSPHATE 10 MG/ML IJ SOLN
10.0000 mg | Freq: Once | INTRAMUSCULAR | Status: AC
  Administered 2020-04-15: 10:00:00 10 mg via INTRAVENOUS
  Filled 2020-04-14: qty 1

## 2020-04-14 MED ORDER — CHLORHEXIDINE GLUCONATE 0.12 % MT SOLN
15.0000 mL | Freq: Once | OROMUCOSAL | Status: AC
  Administered 2020-04-14: 07:00:00 15 mL via OROMUCOSAL

## 2020-04-14 MED ORDER — ONDANSETRON HCL 4 MG/2ML IJ SOLN: 4.0000 mg | Freq: Four times a day (QID) | INTRAMUSCULAR | Status: DC | PRN

## 2020-04-14 MED ORDER — PROPOFOL 500 MG/50ML IV EMUL
INTRAVENOUS | Status: DC | PRN
  Administered 2020-04-14: 08:00:00 30 mg via INTRAVENOUS
  Administered 2020-04-14: 08:00:00 20 mg via INTRAVENOUS

## 2020-04-14 MED ORDER — ONDANSETRON HCL 4 MG/2ML IJ SOLN: 4.0000 mg | Freq: Once | INTRAMUSCULAR | Status: DC | PRN

## 2020-04-14 MED ORDER — ACETAMINOPHEN 500 MG PO TABS
1000.0000 mg | ORAL_TABLET | Freq: Four times a day (QID) | ORAL | Status: AC
  Administered 2020-04-14 – 2020-04-15 (×3): 1000 mg via ORAL
  Filled 2020-04-14 (×3): qty 2

## 2020-04-14 MED ORDER — LEVOTHYROXINE SODIUM 75 MCG PO TABS
150.0000 ug | ORAL_TABLET | Freq: Every day | ORAL | Status: DC
  Administered 2020-04-15: 07:00:00 150 ug via ORAL
  Filled 2020-04-14 (×2): qty 2

## 2020-04-14 MED ORDER — POLYVINYL ALCOHOL 1.4 % OP SOLN: 1.0000 [drp] | Freq: Three times a day (TID) | OPHTHALMIC | Status: DC | PRN

## 2020-04-14 MED ORDER — OXYCODONE HCL 5 MG PO TABS
5.0000 mg | ORAL_TABLET | ORAL | Status: DC | PRN
  Administered 2020-04-14 – 2020-04-15 (×3): 10 mg via ORAL
  Filled 2020-04-14 (×3): qty 2

## 2020-04-14 MED ORDER — LOSARTAN POTASSIUM 50 MG PO TABS: 50.0000 mg | ORAL_TABLET | Freq: Every evening | ORAL | Status: DC

## 2020-04-14 MED ORDER — MIDAZOLAM HCL 2 MG/2ML IJ SOLN
1.0000 mg | INTRAMUSCULAR | Status: DC
  Filled 2020-04-14: qty 2

## 2020-04-14 MED ORDER — ONDANSETRON HCL 4 MG/2ML IJ SOLN
INTRAMUSCULAR | Status: DC | PRN
  Administered 2020-04-14: 4 mg via INTRAVENOUS

## 2020-04-14 MED ORDER — BUPIVACAINE IN DEXTROSE 0.75-8.25 % IT SOLN
INTRATHECAL | Status: DC | PRN
  Administered 2020-04-14: 1.5 mL via INTRATHECAL

## 2020-04-14 MED ORDER — SODIUM CHLORIDE (PF) 0.9 % IJ SOLN
INTRAMUSCULAR | Status: AC
  Filled 2020-04-14: qty 10

## 2020-04-14 MED ORDER — MEMANTINE HCL 10 MG PO TABS
5.0000 mg | ORAL_TABLET | Freq: Two times a day (BID) | ORAL | Status: DC
  Administered 2020-04-14 – 2020-04-15 (×2): 5 mg via ORAL
  Filled 2020-04-14 (×2): qty 1

## 2020-04-14 MED ORDER — PROPOFOL 1000 MG/100ML IV EMUL
INTRAVENOUS | Status: AC
  Filled 2020-04-14: qty 100

## 2020-04-14 MED ORDER — PHENOL 1.4 % MT LIQD: 1.0000 | OROMUCOSAL | Status: DC | PRN

## 2020-04-14 MED ORDER — PROPOFOL 10 MG/ML IV BOLUS
INTRAVENOUS | Status: AC
  Filled 2020-04-14: qty 20

## 2020-04-14 MED ORDER — PHENYLEPHRINE 40 MCG/ML (10ML) SYRINGE FOR IV PUSH (FOR BLOOD PRESSURE SUPPORT)
PREFILLED_SYRINGE | INTRAVENOUS | Status: AC
  Filled 2020-04-14: qty 10

## 2020-04-14 MED ORDER — SODIUM CHLORIDE 0.9 % IV SOLN: INTRAVENOUS | Status: DC

## 2020-04-14 MED ORDER — METHOCARBAMOL 500 MG PO TABS
500.0000 mg | ORAL_TABLET | Freq: Four times a day (QID) | ORAL | Status: DC | PRN
  Administered 2020-04-14 – 2020-04-15 (×2): 500 mg via ORAL
  Filled 2020-04-14 (×3): qty 1

## 2020-04-14 MED ORDER — SODIUM CHLORIDE 0.9 % IR SOLN
Status: DC | PRN
  Administered 2020-04-14: 1000 mL

## 2020-04-14 MED ORDER — SODIUM CHLORIDE (PF) 0.9 % IJ SOLN
INTRAMUSCULAR | Status: DC | PRN
  Administered 2020-04-14: 60 mL

## 2020-04-14 MED ORDER — CEFAZOLIN SODIUM-DEXTROSE 2-4 GM/100ML-% IV SOLN
2.0000 g | Freq: Four times a day (QID) | INTRAVENOUS | Status: AC
  Administered 2020-04-14 (×2): 2 g via INTRAVENOUS
  Filled 2020-04-14 (×2): qty 100

## 2020-04-14 MED ORDER — ONDANSETRON HCL 4 MG PO TABS: 4.0000 mg | ORAL_TABLET | Freq: Four times a day (QID) | ORAL | Status: DC | PRN

## 2020-04-14 MED ORDER — DOCUSATE SODIUM 100 MG PO CAPS
100.0000 mg | ORAL_CAPSULE | Freq: Two times a day (BID) | ORAL | Status: DC
  Administered 2020-04-14 – 2020-04-15 (×2): 100 mg via ORAL
  Filled 2020-04-14 (×2): qty 1

## 2020-04-14 MED ORDER — CEFAZOLIN SODIUM-DEXTROSE 2-4 GM/100ML-% IV SOLN
2.0000 g | INTRAVENOUS | Status: AC
  Administered 2020-04-14: 08:00:00 2 g via INTRAVENOUS
  Filled 2020-04-14: qty 100

## 2020-04-14 MED ORDER — TRAMADOL HCL 50 MG PO TABS
50.0000 mg | ORAL_TABLET | Freq: Four times a day (QID) | ORAL | Status: DC | PRN
  Administered 2020-04-15: 50 mg via ORAL
  Filled 2020-04-14: qty 1

## 2020-04-14 MED ORDER — DORZOLAMIDE HCL-TIMOLOL MAL 2-0.5 % OP SOLN
1.0000 [drp] | Freq: Two times a day (BID) | OPHTHALMIC | Status: DC
  Administered 2020-04-14 – 2020-04-15 (×2): 1 [drp] via OPHTHALMIC
  Filled 2020-04-14: qty 10

## 2020-04-14 MED ORDER — TRANEXAMIC ACID-NACL 1000-0.7 MG/100ML-% IV SOLN
1000.0000 mg | INTRAVENOUS | Status: AC
  Administered 2020-04-14: 08:00:00 1000 mg via INTRAVENOUS
  Filled 2020-04-14: qty 100

## 2020-04-14 MED ORDER — ORAL CARE MOUTH RINSE: 15.0000 mL | Freq: Once | OROMUCOSAL | Status: AC

## 2020-04-14 MED ORDER — DEXAMETHASONE SODIUM PHOSPHATE 10 MG/ML IJ SOLN
INTRAMUSCULAR | Status: AC
  Filled 2020-04-14: qty 1

## 2020-04-14 MED ORDER — STERILE WATER FOR IRRIGATION IR SOLN
Status: DC | PRN
  Administered 2020-04-14: 2000 mL

## 2020-04-14 MED ORDER — ASPIRIN EC 325 MG PO TBEC
325.0000 mg | DELAYED_RELEASE_TABLET | Freq: Two times a day (BID) | ORAL | Status: DC
  Administered 2020-04-15: 09:00:00 325 mg via ORAL
  Filled 2020-04-14: qty 1

## 2020-04-14 MED ORDER — CARBOXYMETHYLCELLULOSE SODIUM 0.5 % OP SOLN: 1.0000 [drp] | Freq: Three times a day (TID) | OPHTHALMIC | Status: DC | PRN

## 2020-04-14 MED ORDER — MORPHINE SULFATE (PF) 2 MG/ML IV SOLN
0.5000 mg | INTRAVENOUS | Status: DC | PRN
  Administered 2020-04-14: 1 mg via INTRAVENOUS
  Filled 2020-04-14: qty 1

## 2020-04-14 MED ORDER — HYDROMORPHONE HCL 1 MG/ML IJ SOLN
0.2500 mg | INTRAMUSCULAR | Status: DC | PRN
Start: 2020-04-14 — End: 2020-04-14

## 2020-04-14 MED ORDER — FLEET ENEMA 7-19 GM/118ML RE ENEM: 1.0000 | ENEMA | Freq: Once | RECTAL | Status: DC | PRN

## 2020-04-14 MED ORDER — POVIDONE-IODINE 10 % EX SWAB: 2.0000 "application " | Freq: Once | CUTANEOUS | Status: DC

## 2020-04-14 MED ORDER — ONDANSETRON HCL 4 MG/2ML IJ SOLN
INTRAMUSCULAR | Status: AC
  Filled 2020-04-14: qty 2

## 2020-04-14 MED ORDER — EPHEDRINE 5 MG/ML INJ
INTRAVENOUS | Status: AC
  Filled 2020-04-14: qty 10

## 2020-04-14 MED ORDER — CROMOLYN SODIUM 4 % OP SOLN: 1.0000 [drp] | Freq: Two times a day (BID) | OPHTHALMIC | Status: DC | PRN

## 2020-04-14 MED ORDER — SODIUM CHLORIDE (PF) 0.9 % IJ SOLN
INTRAMUSCULAR | Status: AC
  Filled 2020-04-14: qty 50

## 2020-04-14 MED ORDER — PROPOFOL 500 MG/50ML IV EMUL
INTRAVENOUS | Status: DC | PRN
  Administered 2020-04-14: 100 ug/kg/min via INTRAVENOUS

## 2020-04-14 MED ORDER — BUPIVACAINE HCL (PF) 0.5 % IJ SOLN
INTRAMUSCULAR | Status: DC | PRN
  Administered 2020-04-14: 30 mL

## 2020-04-14 MED ORDER — DEXAMETHASONE SODIUM PHOSPHATE 10 MG/ML IJ SOLN: 8.0000 mg | Freq: Once | INTRAMUSCULAR | Status: DC

## 2020-04-14 MED ORDER — CLONIDINE HCL (ANALGESIA) 100 MCG/ML EP SOLN
EPIDURAL | Status: DC | PRN
  Administered 2020-04-14: 100 ug

## 2020-04-14 MED ORDER — METOCLOPRAMIDE HCL 5 MG/ML IJ SOLN: 5.0000 mg | Freq: Three times a day (TID) | INTRAMUSCULAR | Status: DC | PRN

## 2020-04-14 MED ORDER — METHOCARBAMOL 500 MG IVPB - SIMPLE MED
500.0000 mg | Freq: Four times a day (QID) | INTRAVENOUS | Status: DC | PRN
  Filled 2020-04-14: qty 50

## 2020-04-14 MED ORDER — PHENYLEPHRINE HCL (PRESSORS) 10 MG/ML IV SOLN
INTRAVENOUS | Status: AC
  Filled 2020-04-14: qty 1

## 2020-04-14 MED ORDER — DIPHENHYDRAMINE HCL 12.5 MG/5ML PO ELIX: 12.5000 mg | ORAL_SOLUTION | ORAL | Status: DC | PRN

## 2020-04-14 MED ORDER — GABAPENTIN 100 MG PO CAPS
100.0000 mg | ORAL_CAPSULE | Freq: Every day | ORAL | Status: DC
  Administered 2020-04-14 – 2020-04-15 (×2): 100 mg via ORAL
  Filled 2020-04-14 (×2): qty 1

## 2020-04-14 MED ORDER — 0.9 % SODIUM CHLORIDE (POUR BTL) OPTIME
TOPICAL | Status: DC | PRN
  Administered 2020-04-14: 09:00:00 1000 mL

## 2020-04-14 MED ORDER — METOCLOPRAMIDE HCL 5 MG PO TABS: 5.0000 mg | ORAL_TABLET | Freq: Three times a day (TID) | ORAL | Status: DC | PRN

## 2020-04-14 MED ORDER — BISACODYL 10 MG RE SUPP: 10.0000 mg | Freq: Every day | RECTAL | Status: DC | PRN

## 2020-04-14 MED ORDER — FENTANYL CITRATE (PF) 100 MCG/2ML IJ SOLN
50.0000 ug | INTRAMUSCULAR | Status: DC
  Administered 2020-04-14: 08:00:00 50 ug via INTRAVENOUS
  Filled 2020-04-14: qty 2

## 2020-04-14 MED ORDER — BUPIVACAINE LIPOSOME 1.3 % IJ SUSP
INTRAMUSCULAR | Status: DC | PRN
  Administered 2020-04-14: 20 mL

## 2020-04-14 MED ORDER — AMLODIPINE BESYLATE 5 MG PO TABS: 5.0000 mg | ORAL_TABLET | Freq: Every evening | ORAL | Status: DC

## 2020-04-14 MED ORDER — ACETAMINOPHEN 10 MG/ML IV SOLN
1000.0000 mg | Freq: Four times a day (QID) | INTRAVENOUS | Status: DC
  Administered 2020-04-14: 09:00:00 1000 mg via INTRAVENOUS
  Filled 2020-04-14: qty 100

## 2020-04-14 MED ORDER — PHENYLEPHRINE HCL-NACL 10-0.9 MG/250ML-% IV SOLN
INTRAVENOUS | Status: DC | PRN
  Administered 2020-04-14: 60 ug/min via INTRAVENOUS

## 2020-04-14 SURGICAL SUPPLY — 56 items
BAG ZIPLOCK 12X15 (MISCELLANEOUS) ×3 IMPLANT
BLADE SAG 18X100X1.27 (BLADE) ×3 IMPLANT
BLADE SAW SGTL 11.0X1.19X90.0M (BLADE) ×3 IMPLANT
BNDG ELASTIC 6X5.8 VLCR STR LF (GAUZE/BANDAGES/DRESSINGS) ×3 IMPLANT
BOWL SMART MIX CTS (DISPOSABLE) ×3 IMPLANT
CEMENT HV SMART SET (Cement) ×6 IMPLANT
CEMENT TIBIA MBT SIZE 5 (Knees) ×1 IMPLANT
CLOSURE WOUND 1/2 X4 (GAUZE/BANDAGES/DRESSINGS) ×2
COVER SURGICAL LIGHT HANDLE (MISCELLANEOUS) ×3 IMPLANT
COVER WAND RF STERILE (DRAPES) IMPLANT
CUFF TOURN SGL QUICK 34 (TOURNIQUET CUFF) ×3
CUFF TRNQT CYL 34X4.125X (TOURNIQUET CUFF) ×1 IMPLANT
DECANTER SPIKE VIAL GLASS SM (MISCELLANEOUS) ×3 IMPLANT
DRAPE U-SHAPE 47X51 STRL (DRAPES) ×3 IMPLANT
DRSG AQUACEL AG ADV 3.5X10 (GAUZE/BANDAGES/DRESSINGS) ×3 IMPLANT
DURAPREP 26ML APPLICATOR (WOUND CARE) ×3 IMPLANT
ELECT REM PT RETURN 15FT ADLT (MISCELLANEOUS) ×3 IMPLANT
FACESHIELD WRAPAROUND (MASK) ×3 IMPLANT
FEMUR SIGMA PS SZ 5.0 R (Femur) ×3 IMPLANT
GLOVE BIO SURGEON STRL SZ7 (GLOVE) ×3 IMPLANT
GLOVE BIO SURGEON STRL SZ8 (GLOVE) ×3 IMPLANT
GLOVE BIOGEL PI IND STRL 8 (GLOVE) ×1 IMPLANT
GLOVE BIOGEL PI IND STRL 8.5 (GLOVE) ×1 IMPLANT
GLOVE BIOGEL PI INDICATOR 8 (GLOVE) ×2
GLOVE BIOGEL PI INDICATOR 8.5 (GLOVE) ×2
GLOVE SURG ENC MOIS LTX SZ6 (GLOVE) IMPLANT
GLOVE SURG UNDER POLY LF SZ6.5 (GLOVE) ×3 IMPLANT
GOWN STRL REUS W/TWL LRG LVL3 (GOWN DISPOSABLE) ×6 IMPLANT
GOWN STRL REUS W/TWL XL LVL3 (GOWN DISPOSABLE) ×3 IMPLANT
HANDPIECE INTERPULSE COAX TIP (DISPOSABLE) ×3
HOLDER FOLEY CATH W/STRAP (MISCELLANEOUS) ×3 IMPLANT
IMMOBILIZER KNEE 20 (SOFTGOODS) ×3
IMMOBILIZER KNEE 20 THIGH 36 (SOFTGOODS) ×1 IMPLANT
KIT TURNOVER KIT A (KITS) IMPLANT
MANIFOLD NEPTUNE II (INSTRUMENTS) ×3 IMPLANT
NS IRRIG 1000ML POUR BTL (IV SOLUTION) ×3 IMPLANT
PACK TOTAL KNEE CUSTOM (KITS) ×3 IMPLANT
PADDING CAST COTTON 6X4 STRL (CAST SUPPLIES) ×3 IMPLANT
PATELLA DOME PFC 41MM (Knees) ×3 IMPLANT
PENCIL SMOKE EVACUATOR (MISCELLANEOUS) ×3 IMPLANT
PIN DRILL FIX HALF THREAD (BIT) ×3 IMPLANT
PIN STEINMAN FIXATION KNEE (PIN) ×3 IMPLANT
PLATE ROT INSERT 15MM SIZE 5 (Plate) ×3 IMPLANT
PROTECTOR NERVE ULNAR (MISCELLANEOUS) ×3 IMPLANT
SET HNDPC FAN SPRY TIP SCT (DISPOSABLE) ×1 IMPLANT
STRIP CLOSURE SKIN 1/2X4 (GAUZE/BANDAGES/DRESSINGS) ×4 IMPLANT
SUT MNCRL AB 4-0 PS2 18 (SUTURE) ×3 IMPLANT
SUT STRATAFIX 0 PDS 27 VIOLET (SUTURE) ×3
SUT VIC AB 2-0 CT1 27 (SUTURE) ×12
SUT VIC AB 2-0 CT1 TAPERPNT 27 (SUTURE) ×4 IMPLANT
SUTURE STRATFX 0 PDS 27 VIOLET (SUTURE) ×1 IMPLANT
TIBIA MBT CEMENT SIZE 5 (Knees) ×3 IMPLANT
TRAY FOL W/BAG SLVR 16FR STRL (SET/KITS/TRAYS/PACK) ×1 IMPLANT
TRAY FOLEY W/BAG SLVR 16FR LF (SET/KITS/TRAYS/PACK) ×3
WATER STERILE IRR 1000ML POUR (IV SOLUTION) ×6 IMPLANT
WRAP KNEE MAXI GEL POST OP (GAUZE/BANDAGES/DRESSINGS) ×3 IMPLANT

## 2020-04-14 NOTE — Op Note (Signed)
OPERATIVE REPORT-TOTAL KNEE ARTHROPLASTY   Pre-operative diagnosis- Osteoarthritis  Right knee(s)  Post-operative diagnosis- Osteoarthritis Right knee(s)  Procedure-  Right  Total Knee Arthroplasty  Surgeon- Jason Rankin. Berenis Corter, MD  Assistant- Leilani Able, PA-C   Anesthesia-  Adductor canal block and spinal  EBL-25 mL   Drains None  Tourniquet time- 35 minutes @ 300 mm Hg    Complications- None  Condition-PACU - hemodynamically stable.   Brief Clinical Note  Jason Friedman. is a 79 y.o. year old male with end stage OA of his right knee with progressively worsening pain and dysfunction. He has constant pain, with activity and at rest and significant functional deficits with difficulties even with ADLs. He has had extensive non-op management including analgesics, injections of cortisone and viscosupplements, and home exercise program, but remains in significant pain with significant dysfunction. Radiographs show bone on bone arthritis lateral and patellofemoral with valgus deformity. He presents now for right Total Knee Arthroplasty.    Procedure in detail---   The patient is brought into the operating room and positioned supine on the operating table. After successful administration of  Adductor canal block and spinal,   a tourniquet is placed high on the  Right thigh(s) and the lower extremity is prepped and draped in the usual sterile fashion. Time out is performed by the operating team and then the  Right lower extremity is wrapped in Esmarch, knee flexed and the tourniquet inflated to 300 mmHg.       A midline incision is made with a ten blade through the subcutaneous tissue to the level of the extensor mechanism. A fresh blade is used to make a medial parapatellar arthrotomy. Soft tissue over the proximal medial tibia is subperiosteally elevated to the joint line with a knife and into the semimembranosus bursa with a Cobb elevator. Soft tissue over the proximal lateral tibia  is elevated with attention being paid to avoiding the patellar tendon on the tibial tubercle. The patella is everted, knee flexed 90 degrees and the ACL and PCL are removed. Findings are bone on bone lateral and patellofemoral with large global osteophytes.        The drill is used to create a starting hole in the distal femur and the canal is thoroughly irrigated with sterile saline to remove the fatty contents. The 5 degree Right  valgus alignment guide is placed into the femoral canal and the distal femoral cutting block is pinned to remove 10 mm off the distal femur. Resection is made with an oscillating saw.      The tibia is subluxed forward and the menisci are removed. The extramedullary alignment guide is placed referencing proximally at the medial aspect of the tibial tubercle and distally along the second metatarsal axis and tibial crest. The block is pinned to remove 70mm off the more deficient lateral  side. Resection is made with an oscillating saw. Size 5is the most appropriate size for the tibia and the proximal tibia is prepared with the modular drill and keel punch for that size.      The femoral sizing guide is placed and size 5 is most appropriate. Rotation is marked off the epicondylar axis and confirmed by creating a rectangular flexion gap at 90 degrees. The size 5 cutting block is pinned in this rotation and the anterior, posterior and chamfer cuts are made with the oscillating saw. The intercondylar block is then placed and that cut is made.      Trial size 5  tibial component, trial size 5 posterior stabilized femur and a 15  mm posterior stabilized rotating platform insert trial is placed. Full extension is achieved with excellent varus/valgus and anterior/posterior balance throughout full range of motion. The patella is everted and thickness measured to be 27  mm. Free hand resection is taken to 15 mm, a 41 template is placed, lug holes are drilled, trial patella is placed, and it tracks  normally. Osteophytes are removed off the posterior femur with the trial in place. All trials are removed and the cut bone surfaces prepared with pulsatile lavage. Cement is mixed and once ready for implantation, the size 5 tibial implant, size  5 posterior stabilized femoral component, and the size 41 patella are cemented in place and the patella is held with the clamp. The trial insert is placed and the knee held in full extension. The Exparel (20 ml mixed with 60 ml saline) is injected into the extensor mechanism, posterior capsule, medial and lateral gutters and subcutaneous tissues.  All extruded cement is removed and once the cement is hard the permanent 15 mm posterior stabilized rotating platform insert is placed into the tibial tray.      The wound is copiously irrigated with saline solution and the extensor mechanism closed with # 0 Stratofix suture. The tourniquet is released for a total tourniquet time of 35  minutes. Flexion against gravity is 140 degrees and the patella tracks normally. Subcutaneous tissue is closed with 2.0 vicryl and subcuticular with running 4.0 Monocryl. The incision is cleaned and dried and steri-strips and a bulky sterile dressing are applied. The limb is placed into a knee immobilizer and the patient is awakened and transported to recovery in stable condition.      Please note that a surgical assistant was a medical necessity for this procedure in order to perform it in a safe and expeditious manner. Surgical assistant was necessary to retract the ligaments and vital neurovascular structures to prevent injury to them and also necessary for proper positioning of the limb to allow for anatomic placement of the prosthesis.   Jason Rankin Shea Swalley, MD    04/14/2020, 9:27 AM

## 2020-04-14 NOTE — Anesthesia Procedure Notes (Addendum)
Anesthesia Regional Block: Adductor canal block   Pre-Anesthetic Checklist: ,, timeout performed, Correct Patient, Correct Site, Correct Laterality, Correct Procedure, Correct Position, site marked, Risks and benefits discussed,  Surgical consent,  Pre-op evaluation,  At surgeon's request and post-op pain management  Laterality: Right  Prep: chloraprep       Needles:  Injection technique: Single-shot  Needle Type: Stimiplex     Needle Length: 9cm  Needle Gauge: 21     Additional Needles:   Procedures:,,,, ultrasound used (permanent image in chart),,,,  Narrative:  Start time: 04/16/2020 8:02 AM End time: 04/16/2020 8:07 AM Injection made incrementally with aspirations every 5 mL.  Performed by: Personally  Anesthesiologist: Lewie Loron, MD  Additional Notes: BP cuff, EKG monitors applied. Sedation begun. Artery and nerve location verified with U/S and anesthetic injected incrementally, slowly, and after negative aspirations under direct u/s guidance. Good fascial /perineural spread. Tolerated well.

## 2020-04-14 NOTE — Anesthesia Procedure Notes (Addendum)
Spinal  Patient location during procedure: OR Start time: 04/14/2020 8:19 AM End time: 04/14/2020 8:22 AM Staffing Performed: anesthesiologist  Anesthesiologist: Nolon Nations, MD Preanesthetic Checklist Completed: patient identified, IV checked, site marked, risks and benefits discussed, surgical consent, monitors and equipment checked, pre-op evaluation and timeout performed Spinal Block Patient position: sitting Prep: DuraPrep and site prepped and draped Patient monitoring: heart rate, continuous pulse ox and blood pressure Approach: right paramedian Location: L3-4 Injection technique: single-shot Needle Needle type: Sprotte  Needle gauge: 24 G Needle length: 9 cm Additional Notes Expiration date of kit checked and confirmed. Patient tolerated procedure well, without complications.

## 2020-04-14 NOTE — Anesthesia Preprocedure Evaluation (Addendum)
Anesthesia Evaluation  Patient identified by MRN, date of birth, ID band Patient awake    Reviewed: Allergy & Precautions, NPO status , Patient's Chart, lab work & pertinent test results  History of Anesthesia Complications (+) PONV, DIFFICULT AIRWAY and history of anesthetic complications  Airway Mallampati: III  TM Distance: >3 FB Neck ROM: Limited    Dental  (+) Dental Advisory Given, Teeth Intact   Pulmonary neg shortness of breath, neg sleep apnea, COPD, neg recent URI,    breath sounds clear to auscultation       Cardiovascular hypertension, Pt. on medications (-) angina(-) Past MI and (-) CHF  Rhythm:Regular     Neuro/Psych  Neuromuscular disease    GI/Hepatic negative GI ROS, Neg liver ROS,   Endo/Other  neg diabetesHypothyroidism   Renal/GU negative Renal ROS     Musculoskeletal  (+) Arthritis ,   Abdominal   Peds  Hematology  (+) anemia ,   Anesthesia Other Findings   Reproductive/Obstetrics                             Anesthesia Physical  Anesthesia Plan  ASA: II  Anesthesia Plan: Spinal   Post-op Pain Management:  Regional for Post-op pain   Induction: Intravenous  PONV Risk Score and Plan: 3 and Ondansetron, Treatment may vary due to age or medical condition, Propofol infusion and TIVA  Airway Management Planned: Natural Airway  Additional Equipment: None  Intra-op Plan:   Post-operative Plan:   Informed Consent: I have reviewed the patients History and Physical, chart, labs and discussed the procedure including the risks, benefits and alternatives for the proposed anesthesia with the patient or authorized representative who has indicated his/her understanding and acceptance.     Dental advisory given  Plan Discussed with: CRNA  Anesthesia Plan Comments:        Anesthesia Quick Evaluation

## 2020-04-14 NOTE — Plan of Care (Signed)
Patient admitted to unit all careplans initiated  

## 2020-04-14 NOTE — Progress Notes (Addendum)
Assisted Dr. Renold Don with right, ultrasound guided, adductor canal block. Side rails up, monitors on throughout procedure. During procedure, Epic had a disconnect from the system and all in procedure VS were lost. Pt remained awake and talking during the procedure. This RN present during procedure. VS did remain stable. Tolerated Procedure well. See flow sheet for first VS after procedure.

## 2020-04-14 NOTE — Progress Notes (Signed)
Orthopedic Tech Progress Note Patient Details:  Jason Friedman 1941/04/10 [id number]  CPM Right Knee CPM Right Knee: On Right Knee Flexion (Degrees): 40 Right Knee Extension (Degrees): 10  Post Interventions Patient Tolerated: Well Instructions Provided: Care of device  [name] 04/14/2020, 10:00 AM

## 2020-04-14 NOTE — Discharge Instructions (Signed)
 Frank Aluisio, MD Total Joint Specialist [facility] [address] [place], [place] [address] [phone]  TOTAL KNEE REPLACEMENT POSTOPERATIVE DIRECTIONS    Knee Rehabilitation, Guidelines Following Surgery  Results after knee surgery are often greatly improved when you follow the exercise, range of motion and muscle strengthening exercises prescribed by your doctor. Safety measures are also important to protect the knee from further injury. If any of these exercises cause you to have increased pain or swelling in your knee joint, decrease the amount until you are comfortable again and slowly increase them. If you have problems or questions, call your caregiver or physical therapist for advice.   BLOOD CLOT PREVENTION . Take a 325 mg Aspirin two times a day for three weeks following surgery. Then take an 81 mg Aspirin once a day for three weeks. Then discontinue Aspirin. . You may resume your vitamins/supplements upon discharge from the hospital. . Do not take any NSAIDs (Advil, Aleve, Ibuprofen, Meloxicam, etc.) until you have discontinued the 325 mg Aspirin.  HOME CARE INSTRUCTIONS  . Remove items at home which could result in a fall. This includes throw rugs or furniture in walking pathways.  . ICE to the affected knee as much as tolerated. Icing helps control swelling. If the swelling is well controlled you will be more comfortable and rehab easier. Continue to use ice on the knee for pain and swelling from surgery. You may notice swelling that will progress down to the foot and ankle. This is normal after surgery. Elevate the leg when you are not up walking on it.    . Continue to use the breathing machine which will help keep your temperature down. It is common for your temperature to cycle up and down following surgery, especially at night when you are not up moving around and exerting yourself. The breathing machine keeps your lungs expanded and your  temperature down. . Do not place pillow under the operative knee, focus on keeping the knee straight while resting  DIET You may resume your previous home diet once you are discharged from the hospital.  DRESSING / WOUND CARE / SHOWERING . Keep your bulky bandage on for 2 days. On the third post-operative day you may remove the Ace bandage and gauze. There is a waterproof adhesive bandage on your skin which will stay in place until your first follow-up appointment. Once you remove this you will not need to place another bandage . You may begin showering 3 days following surgery, but do not submerge the incision under water.  ACTIVITY For the first 5 days, the key is rest and control of pain and swelling . Do your home exercises twice a day starting on post-operative day 3. On the days you go to physical therapy, just do the home exercises once that day. . You should rest, ice and elevate the leg for 50 minutes out of every hour. Get up and walk/stretch for 10 minutes per hour. After 5 days you can increase your activity slowly as tolerated. . Walk with your walker as instructed. Use the walker until you are comfortable transitioning to a cane. Walk with the cane in the opposite hand of the operative leg. You may discontinue the cane once you are comfortable and walking steadily. . Avoid periods of inactivity such as sitting longer than an hour when not asleep. This helps prevent blood clots.  . You may discontinue the knee immobilizer once you are able to perform a straight   leg raise while lying down. . You may resume a sexual relationship in one month or when given the OK by your doctor.  . You may return to work once you are cleared by your doctor.  . Do not drive a car for 6 weeks or until released by your surgeon.  . Do not drive while taking narcotics.  TED HOSE STOCKINGS Wear the elastic stockings on both legs for three weeks following surgery during the day. You may remove them at night  for sleeping.  WEIGHT BEARING Weight bearing as tolerated with assist device (walker, cane, etc) as directed, use it as long as suggested by your surgeon or therapist, typically at least 4-6 weeks.  POSTOPERATIVE CONSTIPATION PROTOCOL Constipation - defined medically as fewer than three stools per week and severe constipation as less than one stool per week.  One of the most common issues patients have following surgery is constipation.  Even if you have a regular bowel pattern at home, your normal regimen is likely to be disrupted due to multiple reasons following surgery.  Combination of anesthesia, postoperative narcotics, change in appetite and fluid intake all can affect your bowels.  In order to avoid complications following surgery, here are some recommendations in order to help you during your recovery period.  . Colace (docusate) - Pick up an over-the-counter form of Colace or another stool softener and take twice a day as long as you are requiring postoperative pain medications.  Take with a full glass of water daily.  If you experience loose stools or diarrhea, hold the colace until you stool forms back up. If your symptoms do not get better within 1 week or if they get worse, check with your doctor. . Dulcolax (bisacodyl) - Pick up over-the-counter and take as directed by the product packaging as needed to assist with the movement of your bowels.  Take with a full glass of water.  Use this product as needed if not relieved by Colace only.  . MiraLax (polyethylene glycol) - Pick up over-the-counter to have on hand. MiraLax is a solution that will increase the amount of water in your bowels to assist with bowel movements.  Take as directed and can mix with a glass of water, juice, soda, coffee, or tea. Take if you go more than two days without a movement. Do not use MiraLax more than once per day. Call your doctor if you are still constipated or irregular after using this medication for 7 days  in a row.  If you continue to have problems with postoperative constipation, please contact the office for further assistance and recommendations.  If you experience "the worst abdominal pain ever" or develop nausea or vomiting, please contact the office immediatly for further recommendations for treatment.  ITCHING If you experience itching with your medications, try taking only a single pain pill, or even half a pain pill at a time.  You can also use Benadryl over the counter for itching or also to help with sleep.   MEDICATIONS See your medication summary on the "After Visit Summary" that the nursing staff will review with you prior to discharge.  You may have some home medications which will be placed on hold until you complete the course of blood thinner medication.  It is important for you to complete the blood thinner medication as prescribed by your surgeon.  Continue your approved medications as instructed at time of discharge.  PRECAUTIONS . If you experience chest pain or shortness of   breath - call 911 immediately for transfer to the hospital emergency department.  . If you develop a fever greater that 101 F, purulent drainage from wound, increased redness or drainage from wound, foul odor from the wound/dressing, or calf pain - CONTACT YOUR SURGEON.                                                   FOLLOW-UP APPOINTMENTS Make sure you keep all of your appointments after your operation with your surgeon and caregivers. You should call the office at the above phone number and make an appointment for approximately two weeks after the date of your surgery or on the date instructed by your surgeon outlined in the "After Visit Summary".  RANGE OF MOTION AND STRENGTHENING EXERCISES  Rehabilitation of the knee is important following a knee injury or an operation. After just a few days of immobilization, the muscles of the thigh which control the knee become weakened and shrink (atrophy). Knee  exercises are designed to build up the tone and strength of the thigh muscles and to improve knee motion. Often times heat used for twenty to thirty minutes before working out will loosen up your tissues and help with improving the range of motion but do not use heat for the first two weeks following surgery. These exercises can be done on a training (exercise) mat, on the floor, on a table or on a bed. Use what ever works the best and is most comfortable for you Knee exercises include:  . Leg Lifts - While your knee is still immobilized in a splint or cast, you can do straight leg raises. Lift the leg to 60 degrees, hold for 3 sec, and slowly lower the leg. Repeat 10-20 times 2-3 times daily. Perform this exercise against resistance later as your knee gets better.  . Quad and Hamstring Sets - Tighten up the muscle on the front of the thigh (Quad) and hold for 5-10 sec. Repeat this 10-20 times hourly. Hamstring sets are done by pushing the foot backward against an object and holding for 5-10 sec. Repeat as with quad sets.   Leg Slides: Lying on your back, slowly slide your foot toward your buttocks, bending your knee up off the floor (only go as far as is comfortable). Then slowly slide your foot back down until your leg is flat on the floor again.  Angel Wings: Lying on your back spread your legs to the side as far apart as you can without causing discomfort.  A rehabilitation program following serious knee injuries can speed recovery and prevent re-injury in the future due to weakened muscles. Contact your doctor or a physical therapist for more information on knee rehabilitation.   IF YOU ARE TRANSFERRED TO A SKILLED REHAB FACILITY If the patient is transferred to a skilled rehab facility following release from the hospital, a list of the current medications will be sent to the facility for the patient to continue.  When discharged from the skilled rehab facility, please have the facility set up the  patient's Home Health Physical Therapy prior to being released. Also, the skilled facility will be responsible for providing the patient with their medications at time of release from the facility to include their pain medication, the muscle relaxants, and their blood thinner medication. If the patient is still at the   rehab facility at time of the two week follow up appointment, the skilled rehab facility will also need to assist the patient in arranging follow up appointment in our office and any transportation needs.  MAKE SURE YOU:  . Understand these instructions.  . Get help right away if you are not doing well or get worse.   DENTAL ANTIBIOTICS:  In most cases prophylactic antibiotics for Dental procdeures after total joint surgery are not necessary.  Exceptions are as follows:  1. History of prior total joint infection  2. Severely immunocompromised (Organ Transplant, cancer chemotherapy, Rheumatoid biologic meds such as Humera)  3. Poorly controlled diabetes (A1C &gt; 8.0, blood glucose over 200)  If you have one of these conditions, contact your surgeon for an antibiotic prescription, prior to your dental procedure.    Pick up stool softner and laxative for home use following surgery while on pain medications. Do not submerge incision under water. Please use good hand washing techniques while changing dressing each day. May shower starting three days after surgery. Please use a clean towel to pat the incision dry following showers. Continue to use ice for pain and swelling after surgery. Do not use any lotions or creams on the incision until instructed by your surgeon.  

## 2020-04-14 NOTE — Evaluation (Signed)
Physical Therapy Evaluation Patient Details Name: Jason Friedman. MRN: [id number] DOB: 20-Sep-1940 Today's Date: 04/14/2020   History of Present Illness  Patient is 79 y.o. male s/p Rt TKA on 04/14/20 with PMH significant for OA, HTN, COPD, multiple C-spine surgeries (fusions C4-6), Lt THA anterior approach on 03/19/19, Rt RTSA 12/11/19, Rt THA 2013, Lt TSA 2018.    Clinical Impression  Delonte Musich. is a 79 y.o. male POD 0 s/p Rt TKA. Patient reports independence with mobility at baseline. Patient is now limited by functional impairments (see PT problem list below) and requires min guard/assist for transfers and gait with RW. Patient was able to ambulate ~200 feet with RW and min guard/assist for safety with RW. Patient instructed in exercise to facilitate circulation. Patient will benefit from continued skilled PT interventions to address impairments and progress towards PLOF. Acute PT will follow to progress mobility and stair training in preparation for safe discharge home.     Follow Up Recommendations Follow surgeon's recommendation for DC plan and follow-up therapies;Outpatient PT    Equipment Recommendations  Rolling walker with 5" wheels;3in1 (PT)    Recommendations for Other Services       Precautions / Restrictions Precautions Precautions: Fall Precaution Comments: reports 1 falls since hip replacement, "slipped on accorns" Restrictions Weight Bearing Restrictions: No Other Position/Activity Restrictions: WBAT      Mobility  Bed Mobility Overal bed mobility: Needs Assistance Bed Mobility: Supine to Sit     Supine to sit: Min guard;HOB elevated     General bed mobility comments: no assist for LE's or trunk, pt using bed rail per cues from therapist.    Transfers Overall transfer level: Needs assistance Equipment used: Rolling walker (2 wheeled) Transfers: Sit to/from Stand Sit to Stand: Min guard         General transfer comment: pt impulsive to  mobilize and stood from EOB without RW prior to therapist cuing to stand. pt returned to sit. Cues for safe hand placement on RW, no assist needed for power up, guarding for safety.  Ambulation/Gait Ambulation/Gait assistance: Min assist;Min guard Gait Distance (Feet): 200 Feet Assistive device: Rolling walker (2 wheeled) Gait Pattern/deviations: Step-to pattern Gait velocity: fair   General Gait Details: pt required cues for safety with proximity to RW, step pattern, and safe gait velocity as pt moving very quickly. pt maintained safer proximity to RW with cues to slow down.  Stairs            Wheelchair Mobility    Modified Rankin (Stroke Patients Only)       Balance Overall balance assessment: Needs assistance Sitting-balance support: Feet supported Sitting balance-Leahy Scale: Good     Standing balance support: During functional activity;Bilateral upper extremity supported Standing balance-Leahy Scale: Fair                               Pertinent Vitals/Pain Pain Assessment: 0-10 Pain Score: 4  Pain Location: Rt knee Pain Descriptors / Indicators: Discomfort;Aching Pain Intervention(s): Limited activity within patient's tolerance;Monitored during session;Repositioned;Ice applied    Home Living Family/patient expects to be discharged to:: Private residence Living Arrangements: Spouse/significant other Available Help at Discharge: Family;Available 24 hours/day Type of Home: House Home Access: Stairs to enter Entrance Stairs-Rails: None Entrance Stairs-Number of Steps: 2-3 from garage Home Layout: Two level;Able to live on main level with bedroom/bathroom;Full bath on main level Home Equipment: Walker - 2 wheels;Cane - single  point (walker is old)      Prior Function Level of Independence: Independent         Comments: patient very active at baseline, pt wnjoys chopping down trees, splitting wood, outdoor activities     Hand Dominance    Dominant Hand: Right    Extremity/Trunk Assessment   Upper Extremity Assessment Upper Extremity Assessment: Overall WFL for tasks assessed    Lower Extremity Assessment Lower Extremity Assessment: RLE deficits/detail RLE Deficits / Details: good quad activation, no extensor lag with SLR RLE Sensation: WNL RLE Coordination: WNL    Cervical / Trunk Assessment Cervical / Trunk Assessment: Normal  Communication   Communication: No difficulties  Cognition Arousal/Alertness: Awake/alert Behavior During Therapy: WFL for tasks assessed/performed;Impulsive Overall Cognitive Status: Within Functional Limits for tasks assessed                                 General Comments: pt eager to mobilize and impulsive with standing up at EOB prior to instruction to do so and without RW.      General Comments      Exercises Total Joint Exercises Ankle Circles/Pumps: AROM;Both;20 reps;Seated   Assessment/Plan    PT Assessment Patient needs continued PT services  PT Problem List Decreased strength;Decreased activity tolerance;Decreased range of motion;Decreased balance;Decreased mobility;Decreased knowledge of use of DME;Decreased knowledge of precautions;Pain       PT Treatment Interventions DME instruction;Gait training;Stair training;Functional mobility training;Therapeutic activities;Therapeutic exercise;Balance training;Patient/family education    PT Goals (Current goals can be found in the Care Plan section)  Acute Rehab PT Goals Patient Stated Goal: to regain independence PT Goal Formulation: With patient/family Time For Goal Achievement: 04/21/20 Potential to Achieve Goals: Good    Frequency 7X/week   Barriers to discharge        Co-evaluation               AM-PAC PT "6 Clicks" Mobility  Outcome Measure Help needed turning from your back to your side while in a flat bed without using bedrails?: None Help needed moving from lying on your back to  sitting on the side of a flat bed without using bedrails?: A Little Help needed moving to and from a bed to a chair (including a wheelchair)?: A Little Help needed standing up from a chair using your arms (e.g., wheelchair or bedside chair)?: A Little Help needed to walk in hospital room?: A Little Help needed climbing 3-5 steps with a railing? : A Little 6 Click Score: 19    End of Session Equipment Utilized During Treatment: Gait belt Activity Tolerance: Patient tolerated treatment well Patient left: in chair;with call bell/phone within reach;with chair alarm set;with family/visitor present Nurse Communication: Mobility status PT Visit Diagnosis: Muscle weakness (generalized) (M62.81);Difficulty in walking, not elsewhere classified (R26.2)    Time: 5916-3846 PT Time Calculation (min) (ACUTE ONLY): 31 min   Charges:   PT Evaluation $PT Eval Low Complexity: 1 Low PT Treatments $Gait Training: 8-22 mins        Wynn Maudlin, DPT Acute Rehabilitation Services Office [phone] Pager [phone]    Anitra Lauth 04/14/2020, 2:25 PM

## 2020-04-14 NOTE — Interval H&P Note (Signed)
History and Physical Interval Note:  04/14/2020 6:35 AM  Jason Friedman.  has presented today for surgery, with the diagnosis of right knee osteoarthritis.  The various methods of treatment have been discussed with the patient and family. After consideration of risks, benefits and other options for treatment, the patient has consented to  Procedure(s) with comments: TOTAL KNEE ARTHROPLASTY (Right) - as a surgical intervention.  The patient's history has been reviewed, patient examined, no change in status, stable for surgery.  I have reviewed the patient's chart and labs.  Questions were answered to the patient's satisfaction.     Jason Friedman

## 2020-04-14 NOTE — Transfer of Care (Signed)
Immediate Anesthesia Transfer of Care Note  Patient: Jason Friedman.  Procedure(s) Performed: TOTAL KNEE ARTHROPLASTY (Right Knee)  Patient Location: PACU  Anesthesia Type:Spinal  Level of Consciousness: awake, alert  and oriented  Airway & Oxygen Therapy: Patient Spontanous Breathing and Patient connected to face mask  Post-op Assessment: Report given to RN and Post -op Vital signs reviewed and stable  Post vital signs: Reviewed and stable  Last Vitals:  Vitals Value Taken Time  BP    Temp    Pulse    Resp    SpO2      Last Pain:  Vitals:   04/14/20 0608  TempSrc: Oral      Patients Stated Pain Goal: 3 (04/14/20 6283)  Complications: No complications documented.

## 2020-04-14 NOTE — Care Plan (Signed)
[facility] Case Management Note  Patient Details  Name: Jason Friedman. MRN: [id number] Date of Birth: 09-08-1940  R TKA on 04-14-20 DCP:  Home with wife.  4 story home with 2 ste. DME:  No needs.  Has a RW and 3-in-1. PT:  HEP.  Approved by Dr. Lequita Halt.  If patient is behind goals at the 2 week p/o appt OP PT will be arranged.                     DME Arranged:  N/A DME Agency:  NA  HH Arranged:  NA HH Agency:  NA  Additional Comments: Please contact me with any questions of if this plan should need to change.  Ennis Forts, RN,CCM [facility]  [phone] 04/14/2020, 2:22 PM

## 2020-04-14 NOTE — Anesthesia Postprocedure Evaluation (Signed)
Anesthesia Post Note  Patient: Jason Friedman.  Procedure(s) Performed: TOTAL KNEE ARTHROPLASTY (Right Knee)     Patient location during evaluation: PACU Anesthesia Type: Spinal Level of consciousness: sedated and patient cooperative Pain management: pain level controlled Vital Signs Assessment: post-procedure vital signs reviewed and stable Respiratory status: spontaneous breathing Cardiovascular status: stable Anesthetic complications: no   No complications documented.  Last Vitals:  Vitals:   04/14/20 1349 04/14/20 1459  BP: 128/72 126/64  Pulse: 82 80  Resp: 17 18  Temp: (!) 36.4 C (!) 36.3 C  SpO2: 95% 93%    Last Pain:  Vitals:   04/14/20 1615  TempSrc:   PainSc: 9                  Shatona Andujar

## 2020-04-15 DIAGNOSIS — M1710 Unilateral primary osteoarthritis, unspecified knee: Secondary | ICD-10-CM | POA: Diagnosis not present

## 2020-04-15 DIAGNOSIS — M1711 Unilateral primary osteoarthritis, right knee: Secondary | ICD-10-CM | POA: Diagnosis not present

## 2020-04-15 DIAGNOSIS — I10 Essential (primary) hypertension: Secondary | ICD-10-CM | POA: Diagnosis not present

## 2020-04-15 DIAGNOSIS — Z79899 Other long term (current) drug therapy: Secondary | ICD-10-CM | POA: Diagnosis not present

## 2020-04-15 DIAGNOSIS — R531 Weakness: Secondary | ICD-10-CM | POA: Diagnosis not present

## 2020-04-15 DIAGNOSIS — Z96643 Presence of artificial hip joint, bilateral: Secondary | ICD-10-CM | POA: Diagnosis not present

## 2020-04-15 DIAGNOSIS — Z96612 Presence of left artificial shoulder joint: Secondary | ICD-10-CM | POA: Diagnosis not present

## 2020-04-15 DIAGNOSIS — Z91040 Latex allergy status: Secondary | ICD-10-CM | POA: Diagnosis not present

## 2020-04-15 DIAGNOSIS — J449 Chronic obstructive pulmonary disease, unspecified: Secondary | ICD-10-CM | POA: Diagnosis not present

## 2020-04-15 DIAGNOSIS — E039 Hypothyroidism, unspecified: Secondary | ICD-10-CM | POA: Diagnosis not present

## 2020-04-15 DIAGNOSIS — M1612 Unilateral primary osteoarthritis, left hip: Secondary | ICD-10-CM | POA: Diagnosis not present

## 2020-04-15 LAB — CBC
HCT: 35.8 % — ABNORMAL LOW (ref 39.0–52.0)
Hemoglobin: 11.6 g/dL — ABNORMAL LOW (ref 13.0–17.0)
MCH: 32.3 pg (ref 26.0–34.0)
MCHC: 32.4 g/dL (ref 30.0–36.0)
MCV: 99.7 fL (ref 80.0–100.0)
Platelets: 303 10*3/uL (ref 150–400)
RBC: 3.59 MIL/uL — ABNORMAL LOW (ref 4.22–5.81)
RDW: 14 % (ref 11.5–15.5)
WBC: 16.7 10*3/uL — ABNORMAL HIGH (ref 4.0–10.5)
nRBC: 0 % (ref 0.0–0.2)

## 2020-04-15 LAB — BASIC METABOLIC PANEL
Anion gap: 6 (ref 5–15)
BUN: 19 mg/dL (ref 8–23)
CO2: 26 mmol/L (ref 22–32)
Calcium: 8.7 mg/dL — ABNORMAL LOW (ref 8.9–10.3)
Chloride: 105 mmol/L (ref 98–111)
Creatinine, Ser: 1.06 mg/dL (ref 0.61–1.24)
GFR, Estimated: >60 mL/min (ref >60)
Glucose, Bld: 185 mg/dL — ABNORMAL HIGH (ref 70–99)
Potassium: 4.6 mmol/L (ref 3.5–5.1)
Sodium: 137 mmol/L (ref 135–145)

## 2020-04-15 MED ORDER — METHOCARBAMOL 500 MG PO TABS: 500.0000 mg | ORAL_TABLET | Freq: Four times a day (QID) | ORAL | 0 refills | Status: DC | PRN

## 2020-04-15 MED ORDER — OXYCODONE HCL 5 MG PO TABS: 5.0000 mg | ORAL_TABLET | Freq: Four times a day (QID) | ORAL | 0 refills | Status: DC | PRN

## 2020-04-15 MED ORDER — ASPIRIN 325 MG PO TBEC: 325.0000 mg | DELAYED_RELEASE_TABLET | Freq: Two times a day (BID) | ORAL | 0 refills | Status: AC

## 2020-04-15 MED ORDER — TRAMADOL HCL 50 MG PO TABS: 50.0000 mg | ORAL_TABLET | Freq: Four times a day (QID) | ORAL | 0 refills | Status: DC | PRN

## 2020-04-15 NOTE — TOC Transition Note (Signed)
Transition of Care [facility]) - CM/SW Discharge Note   Patient Details  Name: Jason Friedman. MRN: [id number] Date of Birth: 31-Oct-1940  [facility]) CM/SW Contact:  Clearance Coots, LCSW Phone Number: 04/15/2020, 10:29 AM   Clinical Narrative:    Patient is requesting a new RW and a wheelchair. CSW notified [facility] CM Chong Sicilian.  RW ordered however wheelchair was not ordered due to patient need to mobilize.    Final next level of care: Home/Self Care (HEP) Barriers to Discharge: No Barriers Identified   Patient Goals and CMS Choice        Discharge Placement                      Discharge Plan and Services                DME Arranged: Walker rolling DME Agency: [employer] Date DME Agency Contacted: 04/15/20 Time DME Agency Contacted: 1029 Representative spoke with at DME Agency: Velna Hatchet HH Arranged: NA HH Agency: NA        Social Determinants of Health (SDOH) Interventions     Readmission Risk Interventions No flowsheet data found.

## 2020-04-15 NOTE — Plan of Care (Signed)
  Problem: Education: Goal: Knowledge of General Education information will improve Description Including pain rating scale, medication(s)/side effects and non-pharmacologic comfort measures Outcome: Progressing   

## 2020-04-15 NOTE — Progress Notes (Signed)
Physical Therapy Treatment Patient Details Name: Jason Friedman. MRN: [id number] DOB: [birth year] Today's Date: 04/15/2020    History of Present Illness Patient is 79 y.o. male s/p Rt TKA on 04/14/20 with PMH significant for OA, HTN, COPD, multiple C-spine surgeries (fusions C4-6), Lt THA anterior approach on 03/19/19, Rt RTSA 12/11/19, Rt THA 2013, Lt TSA 2018.    PT Comments    POD # 1  Pt OOB in recliner.  Assisted with amb.  General Gait Details: 25% VC's to decrease gait speed and safety with turns.Marland KitchenMarland KitchenMarland KitchenMarland Kitchenpt impulsive.  Practiced stair 2 steps no rails using walker forweard with family "hands on" training.  Then returned to room to perform some TE's following HEP handout.  Instructed on proper tech, freq as well as use of ICE.   Addressed all mobility questions, discussed appropriate activity, educated on use of ICE.  Pt ready for D/C to home.   Follow Up Recommendations  Follow surgeons recommendation for DC plan and follow-up therapies Pt declined OP PT so was given HEP handout     Equipment Recommendations  Rolling walker with 5" wheels;3in1 (PT)    Recommendations for Other Services       Precautions / Restrictions Precautions Precautions: Fall Precaution Comments: instructed no pillow under knee Restrictions Weight Bearing Restrictions: No    Mobility  Bed Mobility               General bed mobility comments: OOB in recliner  Transfers Overall transfer level: Needs assistance Equipment used: Rolling walker (2 wheeled) Transfers: Sit to/from [employer] Sit to Stand: Supervision Stand pivot transfers: Supervision;Min guard       General transfer comment: pt impulsive VC's for safety  Ambulation/Gait Ambulation/Gait assistance: Supervision Gait Distance (Feet): 75 Feet Assistive device: Rolling walker (2 wheeled) Gait Pattern/deviations: Step-through pattern     General Gait Details: 25% VC's to decrease gait speed and safety  with turns.Marland KitchenMarland KitchenMarland KitchenMarland Kitchenpt impulsive   Stairs             Wheelchair Mobility    Modified Rankin (Stroke Patients Only)       Balance                                            Cognition Arousal/Alertness: Awake/alert Behavior During Therapy: WFL for tasks assessed/performed;Impulsive Overall Cognitive Status: Within Functional Limits for tasks assessed                                 General Comments: AxO x 3 impulsive      Exercises   Total Knee Replacement TE's following HEP handout 10 reps B LE ankle pumps 05 reps towel squeezes 05 reps knee presses 05 reps heel slides  05 reps SAQ's 05 reps SLR's 05 reps ABD Educated on use of gait belt to assist with TE's Followed by ICE     General Comments        Pertinent Vitals/Pain Pain Assessment: 0-10 Pain Score: 3  Pain Location: Rt knee Pain Descriptors / Indicators: Discomfort;Aching;Tightness Pain Intervention(s): Monitored during session;Premedicated before session;Repositioned;Ice applied    Home Living                      Prior Function            PT  Goals (current goals can now be found in the care plan section) Progress towards PT goals: Progressing toward goals    Frequency    7X/week      PT Plan Current plan remains appropriate    Co-evaluation              AM-PAC PT "6 Clicks" Mobility   Outcome Measure  Help needed turning from your back to your side while in a flat bed without using bedrails?: None Help needed moving from lying on your back to sitting on the side of a flat bed without using bedrails?: None Help needed moving to and from a bed to a chair (including a wheelchair)?: None Help needed standing up from a chair using your arms (e.g., wheelchair or bedside chair)?: None Help needed to walk in hospital room?: A Little Help needed climbing 3-5 steps with a railing? : A Little 6 Click Score: 22    End of Session Equipment  Utilized During Treatment: Gait belt Activity Tolerance: Patient tolerated treatment well Patient left: in chair;with call bell/phone within reach;with chair alarm set;with family/visitor present Nurse Communication: Mobility status (pt ready for D/C to home) PT Visit Diagnosis: Muscle weakness (generalized) (M62.81);Difficulty in walking, not elsewhere classified (R26.2)     Time: 3875-6433 PT Time Calculation (min) (ACUTE ONLY): 30 min  Charges:  $Gait Training: 8-22 mins $Therapeutic Exercise: 8-22 mins                     Felecia Shelling  PTA Acute  Rehabilitation Services Pager      [phone] Office      [phone]

## 2020-04-15 NOTE — Plan of Care (Signed)
  Problem: Education: Goal: Knowledge of the prescribed therapeutic regimen will improve Outcome: Adequate for Discharge Goal: Individualized Educational Video(s) Outcome: Adequate for Discharge   Problem: Activity: Goal: Ability to avoid complications of mobility impairment will improve Outcome: Adequate for Discharge Goal: Range of joint motion will improve Outcome: Adequate for Discharge   Problem: Clinical Measurements: Goal: Postoperative complications will be avoided or minimized Outcome: Adequate for Discharge   Problem: Pain Management: Goal: Pain level will decrease with appropriate interventions Outcome: Adequate for Discharge   Problem: Skin Integrity: Goal: Will show signs of wound healing Outcome: Adequate for Discharge   Problem: Education: Goal: Knowledge of General Education information will improve Description: Including pain rating scale, medication(s)/side effects and non-pharmacologic comfort measures Outcome: Adequate for Discharge   Problem: Health Behavior/Discharge Planning: Goal: Ability to manage health-related needs will improve Outcome: Adequate for Discharge   Problem: Clinical Measurements: Goal: Ability to maintain clinical measurements within normal limits will improve Outcome: Adequate for Discharge Goal: Will remain free from infection Outcome: Adequate for Discharge Goal: Diagnostic test results will improve Outcome: Adequate for Discharge Goal: Respiratory complications will improve Outcome: Adequate for Discharge Goal: Cardiovascular complication will be avoided Outcome: Adequate for Discharge   Problem: Activity: Goal: Risk for activity intolerance will decrease Outcome: Adequate for Discharge   Problem: Nutrition: Goal: Adequate nutrition will be maintained Outcome: Adequate for Discharge   Problem: Coping: Goal: Level of anxiety will decrease Outcome: Adequate for Discharge   Problem: Pain Managment: Goal: General  experience of comfort will improve Outcome: Adequate for Discharge   Problem: Elimination: Goal: Will not experience complications related to bowel motility Outcome: Adequate for Discharge Goal: Will not experience complications related to urinary retention Outcome: Adequate for Discharge   Problem: Skin Integrity: Goal: Risk for impaired skin integrity will decrease Outcome: Adequate for Discharge   Problem: Safety: Goal: Ability to remain free from injury will improve Outcome: Adequate for Discharge

## 2020-04-15 NOTE — Progress Notes (Signed)
Subjective: 1 Day Post-Op Procedure(s) (LRB): TOTAL KNEE ARTHROPLASTY (Right) Patient reports pain as mild.   Patient seen in rounds by Dr. Lequita Halt. Patient is well, and has had no acute complaints or problems. States he is ready to go home. Denies chest pain, SOB, or calf pain. Foley catheter removed this AM. No issues overnight.  We will continue therapy today, ambulated 200' yesterday.  Objective: Vital signs in last 24 hours: Temp:  [96.6 F (35.9 C)-98.9 F (37.2 C)] 98.2 F (36.8 C) (12/21 0513) Pulse Rate:  [60-82] 64 (12/21 0513) Resp:  [8-61] 16 (12/21 0513) BP: (102-141)/(63-72) 141/72 (12/21 0513) SpO2:  [93 %-100 %] 98 % (12/21 0513)  Intake/Output from previous day:  Intake/Output Summary (Last 24 hours) at 04/15/2020 0726 Last data filed at 04/15/2020 0513 Gross per 24 hour  Intake 2740.02 ml  Output 3725 ml  Net -984.98 ml     Intake/Output this shift: No intake/output data recorded.  Labs: Recent Labs    04/15/20 0300  HGB 11.6*   Recent Labs    04/15/20 0300  WBC 16.7*  RBC 3.59*  HCT 35.8*  PLT 303   Recent Labs    04/15/20 0300  NA 137  K 4.6  CL 105  CO2 26  BUN 19  CREATININE 1.06  GLUCOSE 185*  CALCIUM 8.7*   No results for input(s): LABPT, INR in the last 72 hours.  Exam: General - Patient is Alert and Oriented Extremity - Neurologically intact Neurovascular intact Sensation intact distally Dorsiflexion/Plantar flexion intact Dressing - dressing C/D/I Motor Function - intact, moving foot and toes well on exam.   Past Medical History:  Diagnosis Date  . Anemia   . Arthritis   . Cervical spinal stenosis   . Cervical spine fracture (HCC) 20010  . Cervical vertebral fracture (HCC) 25 yrs ago   C 5   . Chronically elevated hemidiaphragm    Right  . 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 COPD (chronic obstructive pulmonary disease) (HCC)    mild copd per lov note dr  Meredeth Ide 04-16-11 on chart  . Difficult intubation 1980s   difficult d/t anterior larynx, successful intubation using Glidescope 04/07/17  . Fracture of cervical vertebrae, multiple (HCC)  in high school   2 cervical vertebrae fx, 3 dislocated cervical vertebrae  . Grade I diastolic dysfunction    noted on ECHO  . Heart abnormality    heart anatomicall rotated, causes abnormal ekg  . Hypertension    hx on, none current  . Hypogonadism in male   . Hypothyroidism   . Impaired memory    x 2 yrs ago after neck injury  . Insomnia   . Paralysis, diaphragm    Left  . PONV (postoperative nausea and vomiting)    likes scopolamine patch, vertigo ,pt. reports that anesthesia "has had problem finding my airway"  . Prostate irregularity    nonfuctional prostate, on testerone gel bid  . Sigmoid diverticulitis   . Sleep apnea    does not use due to weight loss, does not need now, not able to use CPAP, last sleep study- 2018    Assessment/Plan: 1 Day Post-Op Procedure(s) (LRB): TOTAL KNEE ARTHROPLASTY (Right) Principal Problem:   OA (osteoarthritis) of knee Active Problems:   Primary osteoarthritis of right knee  Estimated body mass index is 27.47 kg/m as calculated from the following:   Height as of this encounter: 5\' 9"  (1.753  m).   Weight as of this encounter: 84.4 kg. Advance diet Up with therapy D/C IV fluids   Patient's anticipated LOS is less than 2 midnights, meeting these requirements: - Lives within 1 hour of care - Has a competent adult at home to recover with post-op recover - NO history of  - Diabetes  - Coronary Artery Disease  - Heart failure  - Heart attack  - Stroke  - DVT/VTE  - Cardiac arrhythmia  - Respiratory Failure/COPD  - Renal failure  - Anemia  - Advanced Liver disease  DVT Prophylaxis - Aspirin Weight bearing as tolerated. Continue therapy.  Plan is to go Home after hospital stay. Plan for discharge after one session of PT this AM if meeting  goals with HEP. Follow-up in the office on January 4th.   The PDMP database was reviewed today prior to any opioid medications being prescribed to this patient.  Arther Abbott, PA-C Orthopedic Surgery [phone] 04/15/2020, 7:26 AM

## 2020-04-16 NOTE — Addendum Note (Signed)
Addendum  created 04/16/20 1022 by Lewie Loron, MD   Clinical Note Signed, Intraprocedure Blocks edited

## 2020-04-16 NOTE — Discharge Summary (Signed)
Physician Discharge Summary   Patient ID: Jason Friedman. MRN: [id number] DOB/AGE: Jan 14, 1941 79 y.o.  Admit date: 04/14/2020 Discharge date: 12/21/202[name]1  Primary Diagnosis: Osteoarthritis, right knee   Admission Diagnoses:  Past Medical History:  Diagnosis Date  . Anemia   . Arthritis   . Cervical spinal stenosis   . Cervical spine fracture (HCC) 20010  . Cervical vertebral fracture (HCC) 25 yrs ago   C 5   . Chronically elevated hemidiaphragm    Right  . 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 COPD (chronic obstructive pulmonary disease) (HCC)    mild copd per lov note dr Meredeth Idefleming 04-16-11 on chart  . Difficult intubation 1980s   difficult d/t anterior larynx, successful intubation using Glidescope 04/07/17  . Fracture of cervical vertebrae, multiple (HCC)  in high school   2 cervical vertebrae fx, 3 dislocated cervical vertebrae  . Grade I diastolic dysfunction    noted on ECHO  . Heart abnormality    heart anatomicall rotated, causes abnormal ekg  . Hypertension    hx on, none current  . Hypogonadism in male   . Hypothyroidism   . Impaired memory    x 2 yrs ago after neck injury  . Insomnia   . Paralysis, diaphragm    Left  . PONV (postoperative nausea and vomiting)    likes scopolamine patch, vertigo ,pt. reports that anesthesia "has had problem finding my airway"  . Prostate irregularity    nonfuctional prostate, on testerone gel bid  . Sigmoid diverticulitis   . Sleep apnea    does not use due to weight loss, does not need now, not able to use CPAP, last sleep study- 2018   Discharge Diagnoses:   Principal Problem:   OA (osteoarthritis) of knee Active Problems:   Primary osteoarthritis of right knee  Estimated body mass index is 27.47 kg/m as calculated from the following:   Height as of this encounter: 5\' 9"  (1.753 m).   Weight as of this encounter: 84.4 kg.  Procedure:  Procedure(s)  (LRB): TOTAL KNEE ARTHROPLASTY (Right)   Consults: None  HPI: Jason Friedman. is a 79 y.o. year old male with end stage OA of his right knee with progressively worsening pain and dysfunction. He has constant pain, with activity and at rest and significant functional deficits with difficulties even with ADLs. He has had extensive non-op management including analgesics, injections of cortisone and viscosupplements, and home exercise program, but remains in significant pain with significant dysfunction. Radiographs show bone on bone arthritis lateral and patellofemoral with valgus deformity. He presents now for right Total Knee Arthroplasty.   Laboratory Data: Admission on 04/14/2020, Discharged on 04/15/2020  Component Date Value Ref Range Status  . WBC 04/15/2020 16.7* 4.0 - 10.5 K/uL Final  . RBC 04/15/2020 3.59* 4.22 - 5.81 MIL/uL Final  . Hemoglobin 04/15/2020 11.6* 13.0 - 17.0 g/dL Final  . HCT 78/29/562112/21/2021 35.8* 39.0 - 52.0 % Final  . MCV 04/15/2020 99.7  80.0 - 100.0 fL Final  . MCH 04/15/2020 32.3  26.0 - 34.0 pg Final  . MCHC 04/15/2020 32.4  30.0 - 36.0 g/dL Final  . RDW 30/86/578412/21/2021 14.0  11.5 - 15.5 % Final  . Platelets 04/15/2020 303  150 - 400 K/uL Final  . nRBC 04/15/2020 0.0  0.0 - 0.2 % Final   Performed at [facility], [address] [place], [place] [id number]  .  Sodium 04/15/2020 137  135 - 145 mmol/L Final  . Potassium 04/15/2020 4.6  3.5 - 5.1 mmol/L Final  . Chloride 04/15/2020 105  98 - 111 mmol/L Final  . CO2 04/15/2020 26  22 - 32 mmol/L Final  . Glucose, Bld 04/15/2020 185* 70 - 99 mg/dL Final   Glucose reference range applies only to samples taken after fasting for at least 8 hours.  . BUN 04/15/2020 19  8 - 23 mg/dL Final  . Creatinine, Ser 04/15/2020 1.06  0.61 - 1.24 mg/dL Final  . Calcium 69/67/8938 8.7* 8.9 - 10.3 mg/dL Final  . GFR, Estimated 04/15/2020 >60  >60 mL/min Final   Comment: (NOTE) Calculated using the CKD-EPI Creatinine  Equation (2021)   . Anion gap 04/15/2020 6  5 - 15 Final   Performed at [facility], [address] [place], [place] [address]  Hospital Outpatient Visit on 04/10/2020  Component Date Value Ref Range Status  . SARS Coronavirus 2 04/10/2020 NEGATIVE  NEGATIVE Final   Comment: (NOTE) SARS-CoV-2 target nucleic acids are NOT DETECTED.  The SARS-CoV-2 RNA is generally detectable in upper and lower respiratory specimens during the acute phase of infection. Negative results do not preclude SARS-CoV-2 infection, do not rule out co-infections with other pathogens, and should not be used as the sole basis for treatment or other patient management decisions. Negative results must be combined with clinical observations, patient history, and epidemiological information. The expected result is Negative.  Fact Sheet for Patients: HairSlick.no  Fact Sheet for Healthcare Providers: quierodirigir.com  This test is not yet approved or cleared by the [place] FDA and  has been authorized for detection and/or diagnosis of SARS-CoV-2 by FDA under an Emergency Use Authorization (EUA). This EUA will remain  in effect (meaning this test can be used) for the duration of the COVID-19 declaration under Se                          ction 564(b)(1) of the Act, 21 U.S.C. section 360bbb-3(b)(1), unless the authorization is terminated or revoked sooner.  Performed at [facility], [address], [place], [place] [address]   Hospital Outpatient Visit on 04/03/2020  Component Date Value Ref Range Status  . WBC 04/03/2020 7.4  4.0 - 10.5 K/uL Final  . RBC 04/03/2020 4.29  4.22 - 5.81 MIL/uL Final  . Hemoglobin 04/03/2020 14.2  13.0 - 17.0 g/dL Final  . HCT 52/77/8242 42.9  39.0 - 52.0 % Final  . MCV 04/03/2020 100.0  80.0 - 100.0 fL Final  . MCH 04/03/2020 33.1  26.0 - 34.0 pg Final  . MCHC 04/03/2020 33.1  30.0 - 36.0 g/dL  Final  . RDW 35/36/1443 14.1  11.5 - 15.5 % Final  . Platelets 04/03/2020 380  150 - 400 K/uL Final  . nRBC 04/03/2020 0.0  0.0 - 0.2 % Final   Performed at [facility], [address], [place], [place] [address]  . Sodium 04/03/2020 139  135 - 145 mmol/L Final  . Potassium 04/03/2020 4.0  3.5 - 5.1 mmol/L Final  . Chloride 04/03/2020 104  98 - 111 mmol/L Final  . CO2 04/03/2020 25  22 - 32 mmol/L Final  . Glucose, Bld 04/03/2020 102* 70 - 99 mg/dL Final   Glucose reference range applies only to samples taken after fasting for at least 8 hours.  . BUN 04/03/2020 17  8 - 23 mg/dL  Final  . Creatinine, Ser 04/03/2020 0.90  0.61 - 1.24 mg/dL Final  . Calcium 84/69/6295 9.2  8.9 - 10.3 mg/dL Final  . Total Protein 04/03/2020 6.9  6.5 - 8.1 g/dL Final  . Albumin 28/41/3244 4.2  3.5 - 5.0 g/dL Final  . AST 04/28/7251 34  15 - 41 U/L Final  . ALT 04/03/2020 19  0 - 44 U/L Final  . Alkaline Phosphatase 04/03/2020 59  38 - 126 U/L Final  . Total Bilirubin 04/03/2020 0.6  0.3 - 1.2 mg/dL Final  . GFR, Estimated 04/03/2020 >60  >60 mL/min Final   Comment: (NOTE) Calculated using the CKD-EPI Creatinine Equation (2021)   . Anion gap 04/03/2020 10  5 - 15 Final   Performed at [facility], [address], [place], [place] [address]  . Prothrombin Time 04/03/2020 13.4  11.4 - 15.2 seconds Final  . INR 04/03/2020 1.1  0.8 - 1.2 Final   Comment: (NOTE) INR goal varies based on device and disease states. Performed at [facility], [address] [place], [place] [address]   . aPTT 04/03/2020 93* 24 - 36 seconds Final   Comment:        IF BASELINE aPTT IS ELEVATED, SUGGEST PATIENT RISK ASSESSMENT BE USED TO DETERMINE APPROPRIATE ANTICOAGULANT THERAPY. Performed at [facility], [address] [place], [place] [address]   . ABO/RH(D) 04/03/2020 A POS   Final  . Antibody Screen 04/03/2020 NEG   Final  . Sample  Expiration 04/03/2020 04/17/2020,2359   Final  . Extend sample reason 04/03/2020    Final                   Value:NO TRANSFUSIONS OR PREGNANCY IN THE PAST 3 MONTHS Performed at [facility], [address], [place], [place] [address]   . MRSA, PCR 04/03/2020 NEGATIVE  NEGATIVE Final  . Staphylococcus aureus 04/03/2020 NEGATIVE  NEGATIVE Final   Comment: (NOTE) The Xpert SA Assay (FDA approved for NASAL specimens in patients 87 years of age and older), is one component of a comprehensive surveillance program. It is not intended to diagnose infection nor to guide or monitor treatment. Performed at [facility], [address] [place], [place] [address]      X-Rays:No results found.  EKG: Orders placed or performed during the hospital encounter of 12/07/19  . EKG 12 lead per protocol  . EKG 12 lead per protocol     Hospital Course: Jason Friedman. is a 79 y.o. who was admitted to [facility]. They were brought to the operating room on 04/14/2020 and underwent Procedure(s): TOTAL KNEE ARTHROPLASTY.  Patient tolerated the procedure well and was later transferred to the recovery room and then to the orthopaedic floor for postoperative care. They were given PO and IV analgesics for pain control following their surgery. They were given 24 hours of postoperative antibiotics of  Anti-infectives (From admission, onward)   Start     Dose/Rate Route Frequency Ordered Stop   04/14/20 1400  ceFAZolin (ANCEF) IVPB 2g/100 mL premix        2 g 200 mL/hr over 30 Minutes Intravenous Every 6 hours 04/14/20 1113 04/14/20 2056   04/14/20 0615  ceFAZolin (ANCEF) IVPB 2g/100 mL premix        2 g 200 mL/hr over 30 Minutes Intravenous On call to O.R. 04/14/20 1884 04/14/20 0816     and started on DVT prophylaxis  in the form of Aspirin.   PT and OT were ordered for total joint protocol. Discharge planning consulted to help with postop disposition and  equipment needs.  Patient had a good night on the evening of surgery. They started to get up OOB with therapy on POD #0. Pt was seen during rounds and was ready to go home pending progress with therapy. He worked with therapy on POD #1 and was meeting his goals. Pt was discharged to home later that day in stable condition.  Diet: Regular diet Activity: WBAT Follow-up: in 2 weeks Disposition: Home with HEP Discharged Condition: stable   Discharge Instructions    Call MD / Call 911   Complete by: As directed    If you experience chest pain or shortness of breath, CALL 911 and be transported to the hospital emergency room.  If you develope a fever above 101 F, pus (white drainage) or increased drainage or redness at the wound, or calf pain, call your surgeon's office.   Change dressing   Complete by: As directed    You may remove the bulky bandage (ACE wrap and gauze) two days after surgery. You will have an adhesive waterproof bandage underneath. Leave this in place until your first follow-up appointment.   Constipation Prevention   Complete by: As directed    Drink plenty of fluids.  Prune juice may be helpful.  You may use a stool softener, such as Colace (over the counter) 100 mg twice a day.  Use MiraLax (over the counter) for constipation as needed.   Diet - low sodium heart healthy   Complete by: As directed    Do not put a pillow under the knee. Place it under the heel.   Complete by: As directed    Driving restrictions   Complete by: As directed    No driving for two weeks   TED hose   Complete by: As directed    Use stockings (TED hose) for three weeks on both leg(s).  You may remove them at night for sleeping.   Weight bearing as tolerated   Complete by: As directed      Allergies as of 04/15/2020      Reactions   Duloxetine Other (See Comments)   Altered personality - didn't eat, pain all over   Cefuroxime Axetil [ceftin] Other (See Comments)   Joint pain   Gabapentin  Other (See Comments)   Visual disturbance, Friedman loss   Shellfish Allergy Swelling   SWELLING REACTION UNSPECIFIED    Sulfa Antibiotics Other (See Comments)   UNSPECIFIED REACTION    Latex Other (See Comments)   Skin irritation       Medication List    STOP taking these medications   cyclobenzaprine 10 MG tablet Commonly known as: FLEXERIL   HYDROcodone-acetaminophen 7.5-325 MG tablet Commonly known as: NORCO   naproxen 500 MG tablet Commonly known as: Naprosyn   ondansetron 4 MG tablet Commonly known as: Zofran   oxyCODONE-acetaminophen 5-325 MG tablet Commonly known as: Percocet     TAKE these medications   amLODipine 5 MG tablet Commonly known as: NORVASC Take 5 mg by mouth every evening.   amoxicillin 500 MG tablet Commonly known as: AMOXIL Take 2,000 mg by mouth See admin instructions. Take 4 capsules (2000 mg) by mouth 1 hour prior to dental appointments.   AndroGel Pump 20.25 MG/ACT (1.62%) Gel Generic drug: Testosterone Apply 2.5 Pump topically 2 (two) times daily.   aspirin 325 MG EC  tablet Take 1 tablet (325 mg total) by mouth 2 (two) times daily for 20 days. Then take one 81 mg aspirin once a day for three weeks. Then discontinue aspirin.   Brinzolamide-Brimonidine 1-0.2 % Susp Place 1 drop into both eyes 2 (two) times daily as needed (dry eyes).   carboxymethylcellulose 0.5 % Soln Commonly known as: REFRESH PLUS Place 1 drop into both eyes 3 (three) times daily as needed (dry eyes).   cromolyn 4 % ophthalmic solution Commonly known as: OPTICROM Place 1 drop into both eyes 2 (two) times daily.   donepezil 5 MG tablet Commonly known as: ARICEPT Take 5 mg by mouth at bedtime.   dorzolamide-timolol 22.3-6.8 MG/ML ophthalmic solution Commonly known as: COSOPT Place 1 drop into both eyes 2 (two) times daily.   doxycycline 100 MG capsule Commonly known as: VIBRAMYCIN Take 100 mg by mouth 2 (two) times daily as needed (for tick bites). Take for 7  days   fluticasone 50 MCG/ACT nasal spray Commonly known as: FLONASE Place 2 sprays into both nostrils at bedtime as needed for allergies.   gabapentin 100 MG capsule Commonly known as: NEURONTIN Take 100 mg by mouth every morning.   levothyroxine 150 MCG tablet Commonly known as: SYNTHROID Take 150 mcg by mouth daily before breakfast.   losartan 50 MG tablet Commonly known as: COZAAR Take 50 mg by mouth every evening.   meclizine 25 MG tablet Commonly known as: ANTIVERT Take 25 mg by mouth every morning.   memantine 5 MG tablet Commonly known as: NAMENDA Take 5 mg by mouth 2 (two) times daily.   methocarbamol 500 MG tablet Commonly known as: ROBAXIN Take 1 tablet (500 mg total) by mouth every 6 (six) hours as needed for muscle spasms.   multivitamin with minerals tablet Take 1 tablet by mouth daily.   oxyCODONE 5 MG immediate release tablet Commonly known as: Oxy IR/ROXICODONE Take 1-2 tablets (5-10 mg total) by mouth every 6 (six) hours as needed for severe pain.   tiZANidine 4 MG tablet Commonly known as: ZANAFLEX Take 4 mg by mouth at bedtime.   traMADol 50 MG tablet Commonly known as: ULTRAM Take 1-2 tablets (50-100 mg total) by mouth every 6 (six) hours as needed for moderate pain.            Discharge Care Instructions  (From admission, onward)         Start     Ordered   04/15/20 0000  Weight bearing as tolerated        04/15/20 0730   04/15/20 0000  Change dressing       Comments: You may remove the bulky bandage (ACE wrap and gauze) two days after surgery. You will have an adhesive waterproof bandage underneath. Leave this in place until your first follow-up appointment.   04/15/20 0730          Follow-up Information    Ollen Gross, MD. Go on 04/29/2020.   Specialty: Orthopedic Surgery Why: You have a post-operative appointment on 04-29-20 at 3:320 pm.  Contact information: [address] [place] [address] [place]  [address] [phone]               Signed: Arther Abbott, PA-C Orthopedic Surgery 04/16/2020, 1:59 PM

## 2020-04-22 ENCOUNTER — Encounter (HOSPITAL_COMMUNITY): Payer: Self-pay | Admitting: Orthopedic Surgery

## 2020-05-05 DIAGNOSIS — M79672 Pain in left foot: Secondary | ICD-10-CM | POA: Diagnosis not present

## 2020-05-05 DIAGNOSIS — G609 Hereditary and idiopathic neuropathy, unspecified: Secondary | ICD-10-CM | POA: Diagnosis not present

## 2020-05-05 DIAGNOSIS — M79671 Pain in right foot: Secondary | ICD-10-CM | POA: Diagnosis not present

## 2020-05-20 DIAGNOSIS — Z96651 Presence of right artificial knee joint: Secondary | ICD-10-CM | POA: Diagnosis not present

## 2020-06-19 DIAGNOSIS — G4733 Obstructive sleep apnea (adult) (pediatric): Secondary | ICD-10-CM | POA: Diagnosis not present

## 2020-06-19 DIAGNOSIS — E039 Hypothyroidism, unspecified: Secondary | ICD-10-CM | POA: Diagnosis not present

## 2020-06-19 DIAGNOSIS — R413 Other amnesia: Secondary | ICD-10-CM | POA: Diagnosis not present

## 2020-06-19 DIAGNOSIS — G894 Chronic pain syndrome: Secondary | ICD-10-CM | POA: Diagnosis not present

## 2020-06-19 DIAGNOSIS — R270 Ataxia, unspecified: Secondary | ICD-10-CM | POA: Diagnosis not present

## 2020-06-19 DIAGNOSIS — E559 Vitamin D deficiency, unspecified: Secondary | ICD-10-CM | POA: Diagnosis not present

## 2020-06-19 DIAGNOSIS — M179 Osteoarthritis of knee, unspecified: Secondary | ICD-10-CM | POA: Diagnosis not present

## 2020-06-19 DIAGNOSIS — E538 Deficiency of other specified B group vitamins: Secondary | ICD-10-CM | POA: Diagnosis not present

## 2020-06-21 DIAGNOSIS — Z96611 Presence of right artificial shoulder joint: Secondary | ICD-10-CM | POA: Diagnosis not present

## 2020-06-21 DIAGNOSIS — Z96612 Presence of left artificial shoulder joint: Secondary | ICD-10-CM | POA: Diagnosis not present

## 2020-06-27 IMAGING — RF DG CERVICAL SPINE 2 OR 3 VIEWS
1 series · 2 of 2 positions shown · non-contrast
Comparison: 04/28/2018

CLINICAL DATA: C4-C6 posterior cervical fusion.

EXAM:
DG C-ARM 61-120 MIN; CERVICAL SPINE - 2-3 VIEW

[Series 1: run · 2 of 2 slices shown]
[im 1/2]
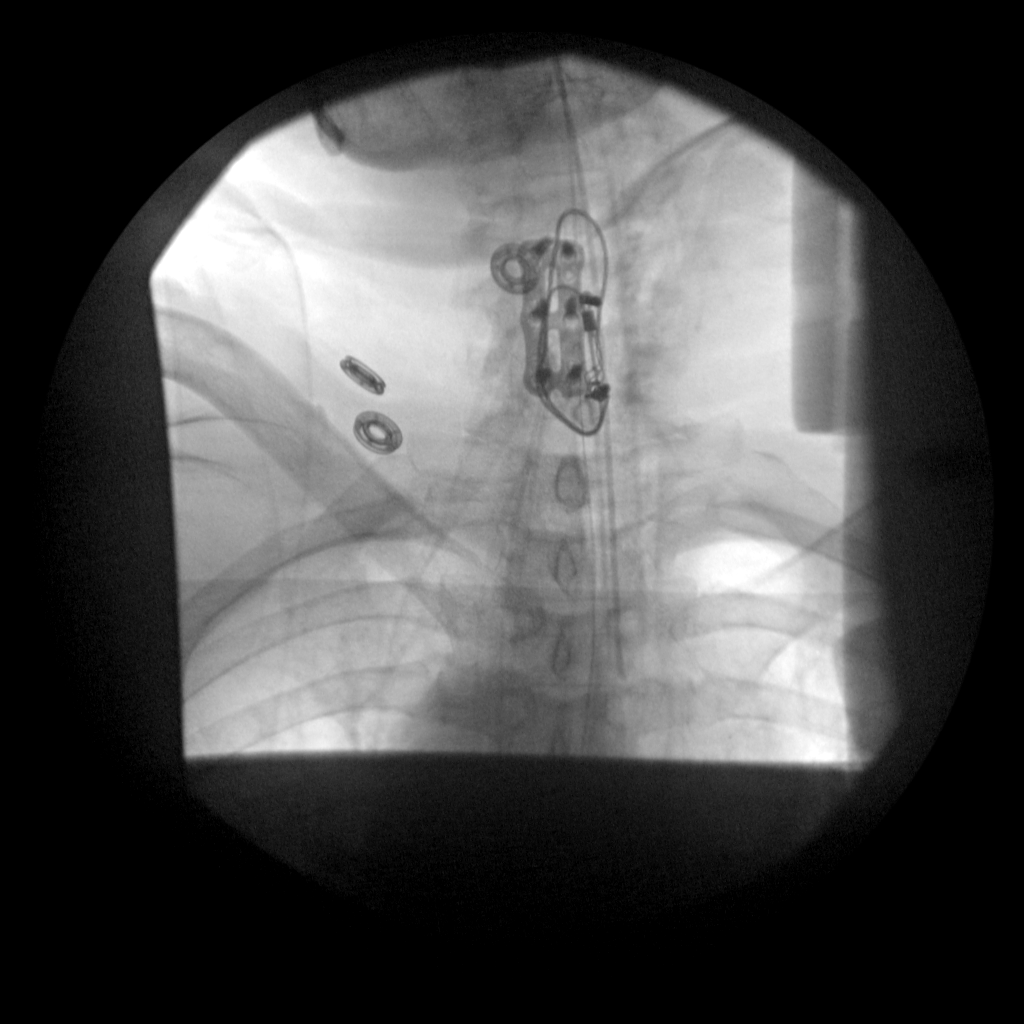
[im 2/2]
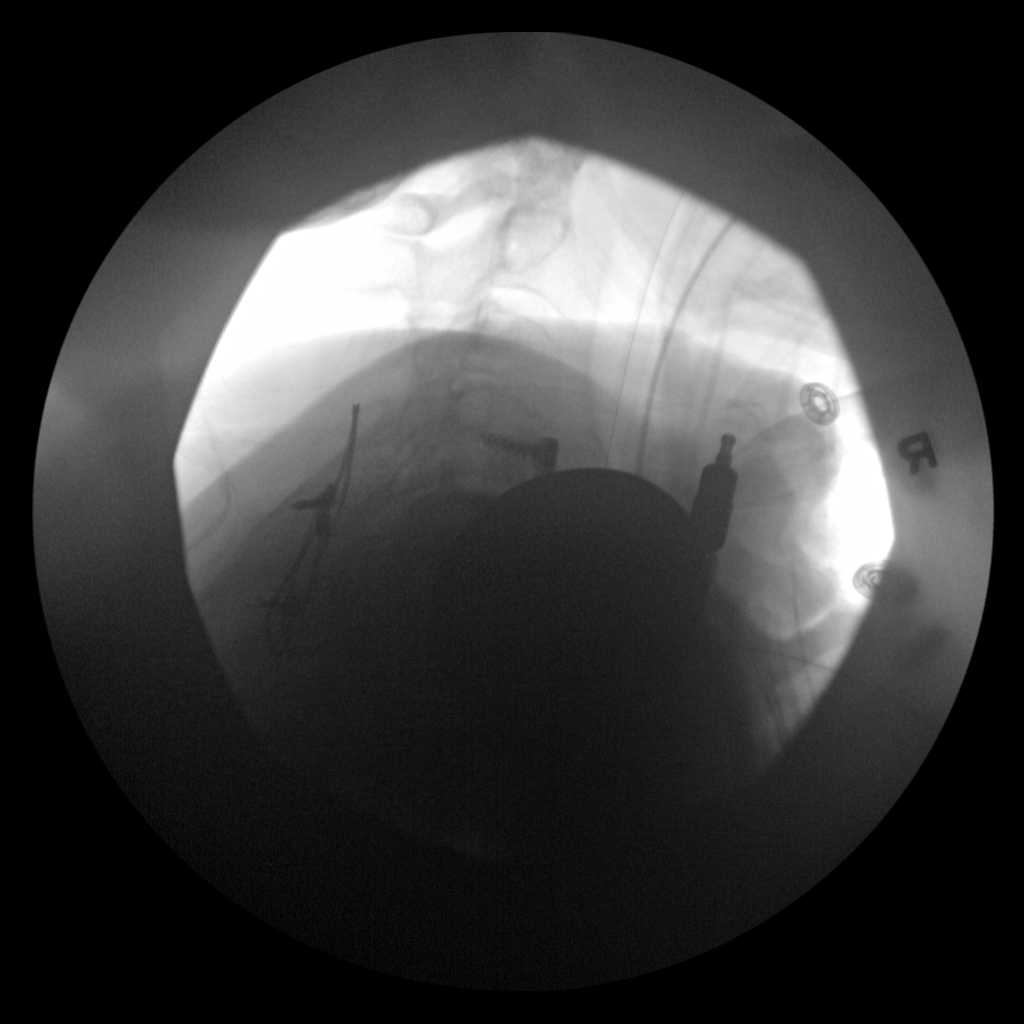

[2 of 2 positions shown; findings below may reference images not displayed]

FINDINGS: Intraoperative fluoroscopic frontal and lateral views of the
cervical spine demonstrate placement of cerclage wires through the
posterior elements of C4 through C7. There is a pre-existing
anterior fusion at the same levels.

Intubated patient.
IMPRESSION: Cerclage wire placement around the spinous processes of C4 through
C7.

## 2020-07-25 DIAGNOSIS — G894 Chronic pain syndrome: Secondary | ICD-10-CM | POA: Diagnosis not present

## 2020-07-25 DIAGNOSIS — R609 Edema, unspecified: Secondary | ICD-10-CM | POA: Diagnosis not present

## 2020-07-25 DIAGNOSIS — I10 Essential (primary) hypertension: Secondary | ICD-10-CM | POA: Diagnosis not present

## 2020-07-25 DIAGNOSIS — G4733 Obstructive sleep apnea (adult) (pediatric): Secondary | ICD-10-CM | POA: Diagnosis not present

## 2020-07-25 DIAGNOSIS — R413 Other amnesia: Secondary | ICD-10-CM | POA: Diagnosis not present

## 2020-07-25 DIAGNOSIS — E538 Deficiency of other specified B group vitamins: Secondary | ICD-10-CM | POA: Diagnosis not present

## 2020-07-25 DIAGNOSIS — Z0000 Encounter for general adult medical examination without abnormal findings: Secondary | ICD-10-CM | POA: Diagnosis not present

## 2020-07-25 DIAGNOSIS — E559 Vitamin D deficiency, unspecified: Secondary | ICD-10-CM | POA: Diagnosis not present

## 2020-07-25 DIAGNOSIS — Z79899 Other long term (current) drug therapy: Secondary | ICD-10-CM | POA: Diagnosis not present

## 2020-07-25 DIAGNOSIS — E039 Hypothyroidism, unspecified: Secondary | ICD-10-CM | POA: Diagnosis not present

## 2020-07-25 DIAGNOSIS — Z1389 Encounter for screening for other disorder: Secondary | ICD-10-CM | POA: Diagnosis not present

## 2020-07-25 DIAGNOSIS — E291 Testicular hypofunction: Secondary | ICD-10-CM | POA: Diagnosis not present

## 2020-09-17 DIAGNOSIS — G894 Chronic pain syndrome: Secondary | ICD-10-CM | POA: Diagnosis not present

## 2020-09-17 DIAGNOSIS — E559 Vitamin D deficiency, unspecified: Secondary | ICD-10-CM | POA: Diagnosis not present

## 2020-09-17 DIAGNOSIS — E039 Hypothyroidism, unspecified: Secondary | ICD-10-CM | POA: Diagnosis not present

## 2020-09-17 DIAGNOSIS — G4733 Obstructive sleep apnea (adult) (pediatric): Secondary | ICD-10-CM | POA: Diagnosis not present

## 2020-09-17 DIAGNOSIS — R270 Ataxia, unspecified: Secondary | ICD-10-CM | POA: Diagnosis not present

## 2020-09-17 DIAGNOSIS — I10 Essential (primary) hypertension: Secondary | ICD-10-CM | POA: Diagnosis not present

## 2020-09-17 DIAGNOSIS — M179 Osteoarthritis of knee, unspecified: Secondary | ICD-10-CM | POA: Diagnosis not present

## 2020-09-17 DIAGNOSIS — R413 Other amnesia: Secondary | ICD-10-CM | POA: Diagnosis not present

## 2020-09-17 DIAGNOSIS — E538 Deficiency of other specified B group vitamins: Secondary | ICD-10-CM | POA: Diagnosis not present

## 2020-09-18 DIAGNOSIS — Z96651 Presence of right artificial knee joint: Secondary | ICD-10-CM | POA: Diagnosis not present

## 2020-10-06 ENCOUNTER — Other Ambulatory Visit: Payer: Self-pay

## 2020-10-06 ENCOUNTER — Emergency Department (HOSPITAL_COMMUNITY)
Admission: EM | Admit: 2020-10-06 | Discharge: 2020-10-07 | Discharge status: Against Medical Advice | Payer: Medicare PPO | Attending: Emergency Medicine | Admitting: Emergency Medicine

## 2020-10-06 DIAGNOSIS — Z5321 Procedure and treatment not carried out due to patient leaving prior to being seen by health care provider: Secondary | ICD-10-CM | POA: Insufficient documentation

## 2020-10-06 DIAGNOSIS — W109XXA Fall (on) (from) unspecified stairs and steps, initial encounter: Secondary | ICD-10-CM | POA: Insufficient documentation

## 2020-10-06 DIAGNOSIS — S0990XA Unspecified injury of head, initial encounter: Secondary | ICD-10-CM | POA: Diagnosis present

## 2020-10-06 DIAGNOSIS — S0191XA Laceration without foreign body of unspecified part of head, initial encounter: Secondary | ICD-10-CM | POA: Diagnosis not present

## 2020-10-06 DIAGNOSIS — S01112A Laceration without foreign body of left eyelid and periocular area, initial encounter: Secondary | ICD-10-CM | POA: Diagnosis not present

## 2020-10-06 NOTE — ED Provider Notes (Signed)
Emergency Medicine Provider Triage Evaluation Note  Jason Friedman. , a 80 y.o. male  was evaluated in triage.  Pt complains of fall and head laceration.  He reports that he fell when he tripped over a step.  He fell down three steps and hit his face on the ground.  He states his tetanus is up to date but he doesn't know when it was.  No blood thinners.  Other than face and head injury he denies any other injuries.     Review of Systems  Positive: Lacerations x2 above left eye Negative: Left eyevision change, pain in left eye, LOC  Physical Exam  BP 125/78 (BP Location: Left Arm)   Pulse 62   Temp 98.3 F (36.8 C) (Oral)   Resp 16   SpO2 97%  Gen:   Awake, no distress   Resp:  Normal effort  MSK:   Moves extremities without difficulty  Other:  There are two lacerations over the left eye.  There is a approx. 4cm laceration across the left eye lid.  There is a 1.5cm laceration through eye brow that is gaping.  No  pain with full EOM.  Mild TTP over nose.   Medical Decision Making  Medically screening exam initiated at 10:04 PM.  Appropriate orders placed.  Jason Friedman. was informed that the remainder of the evaluation will be completed by another provider, this initial triage assessment does not replace that evaluation, and the importance of remaining in the ED until their evaluation is complete.  Patient and I discussed recommended CT head and face.  He refuses this.  I explained the concern for head injury including possibility of subdural or other bleed.  He states he understands this risk and refuses.      Norman Clay 10/06/20 2212    Benjiman Core, MD 10/06/20 [phone]

## 2020-10-06 NOTE — ED Triage Notes (Signed)
Pt came in with c/o fall and laceration to superior R eye. Lac is approx 1.5 cm in diameter. No LOC. Tripped over step

## 2020-10-07 ENCOUNTER — Ambulatory Visit
Admission: EM | Admit: 2020-10-07 | Discharge: 2020-10-07 | Disposition: A | Discharge status: Home / Self Care | Payer: Medicare PPO

## 2020-10-07 DIAGNOSIS — W19XXXA Unspecified fall, initial encounter: Secondary | ICD-10-CM

## 2020-10-07 DIAGNOSIS — S0181XA Laceration without foreign body of other part of head, initial encounter: Secondary | ICD-10-CM

## 2020-10-07 NOTE — ED Provider Notes (Signed)
[facility]    CSN: [id number] Arrival date & time: 10/07/20  [id number]      History   Chief Complaint Chief Complaint  Patient presents with   Fall    HPI Jason HomansBrooks J Briley Jr. is a 80 y.o. male.   HPI Patient presents with facial injury following a fall yesterday.  Patient was working in his garage and fell landing on his face sustaining an injury to his right upper eyelid and 2 lacerations above and below his right eyebrow.  He presented to the ER however left due to the wait.  ER provider did recommend patient have a head CT however he declined to remain at the ER due to the wait time.  He denies any dizziness.  He did not lose consciousness.  He is not having any difficulty with vision with the exception that he scraped his glasses.  He denies any nausea or vomiting.  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 , peripheri neuropathy   COPD (chronic obstructive pulmonary disease) (HCC)    mild copd per lov note dr Meredeth Idefleming 04-16-11 on chart   Difficult intubation 1980s   difficult d/t anterior larynx, successful intubation using Glidescope 04/07/17   Fracture of cervical vertebrae, multiple (HCC)  in high school   2 cervical vertebrae fx, 3 dislocated cervical vertebrae   Grade I diastolic dysfunction    noted on ECHO   Heart abnormality    heart anatomicall rotated, causes abnormal ekg   Hypertension    hx on, none current   Hypogonadism in male    Hypothyroidism    Impaired memory    x 2 yrs ago after neck injury   Insomnia    Paralysis, diaphragm    Left   PONV (postoperative nausea and vomiting)    likes scopolamine patch, vertigo ,pt. reports that anesthesia "has had problem finding my airway"    Prostate irregularity    nonfuctional prostate, on testerone gel bid   Sigmoid diverticulitis    Sleep apnea    does not use due to weight loss, does not need now, not able to use CPAP, last sleep study- 2018    Patient Active Problem List   Diagnosis Date Noted   OA (osteoarthritis) of knee 04/14/2020   Primary osteoarthritis of right knee 04/14/2020   Osteoarthritis of left hip 03/19/2019   Cervical pseudoarthrosis (HCC) 05/08/2018   Cervical stenosis of spine 03/15/2018   Other spondylosis with myelopathy, cervical region 03/09/2018   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    Past Surgical History:  Procedure Laterality Date   ANTERIOR CERVICAL DECOMP/DISCECTOMY FUSION N/A 03/15/2018   Procedure: CERVICAL FOUR-FIVE, CERVICAL FIVE-SIX ANTERIOR CERVICAL DECOMPRESSION/DISCECTOMY FUSION ALLOGRAFT AND PLATE;  Surgeon: Eldred MangesYates, Mark C, MD;  Location: [facility] OR;  Service: Orthopedics;  Laterality: N/A;  CERVICAL FOUR-FIVE, CERVICAL FIVE-SIX ANTERIOR CERVICAL DECOMPRESSION/DISCECTOMY FUSION ALLOGRAFT AND PLATE   CARPAL TUNNEL RELEASE Left    hand is now numb   piliodional cyst  yrs ago   done x 3   POSTERIOR CERVICAL FUSION/FORAMINOTOMY N/A 05/08/2018   Procedure: C4-C6 POSTERIOR CERVICAL FUSION, WIRING, ILIAC BONE MARROW ASPIRATE;  Surgeon: Eldred MangesYates, Mark C, MD;  Location: [facility] OR;  Service: Orthopedics;  Laterality: N/A;   REVERSE SHOULDER ARTHROPLASTY Right 12/11/2019   Procedure: REVERSE SHOULDER ARTHROPLASTY;  Surgeon: Francena Hanly, MD;  Location: [facility];  Service: Orthopedics;  Laterality: Right;    right shoulder arthroscopy  Mar 02, 2011   TONSILLECTOMY     TOTAL HIP ARTHROPLASTY  08/23/2011   Procedure: TOTAL HIP ARTHROPLASTY;  Surgeon: Loanne Drilling, MD;  Location: [facility];  Service: Orthopedics;  Laterality: Right;   TOTAL HIP ARTHROPLASTY Left 03/19/2019   Procedure: TOTAL HIP ARTHROPLASTY ANTERIOR APPROACH;  Surgeon: Ollen Gross, MD;  Location: [facility];  Service:  Orthopedics;  Laterality: Left;    TOTAL KNEE ARTHROPLASTY Right 04/14/2020   Procedure: TOTAL KNEE ARTHROPLASTY;  Surgeon: Ollen Gross, MD;  Location: [facility];  Service: Orthopedics;  Laterality: Right;    TOTAL SHOULDER ARTHROPLASTY Left 04/07/2017   Procedure: LEFT TOTAL SHOULDER ARTHROPLASTY;  Surgeon: Francena Hanly, MD;  Location: [facility] OR;  Service: Orthopedics;  Laterality: Left;       Home Medications    Prior to Admission medications   Medication Sig Start Date End Date Taking? Authorizing Provider  amLODipine (NORVASC) 5 MG tablet Take 5 mg by mouth every evening.  01/11/19   [provider]  amoxicillin (AMOXIL) 500 MG tablet Take 2,000 mg by mouth See admin instructions. Take 4 capsules (2000 mg) by mouth 1 hour prior to dental appointments. 12/20/18   [provider]  ANDROGEL PUMP 20.25 MG/ACT (1.62%) GEL Apply 2.5 Pump topically 2 (two) times daily.  04/11/15   [provider]  Brinzolamide-Brimonidine 1-0.2 % SUSP Place 1 drop into both eyes 2 (two) times daily as needed (dry eyes).     [provider]  carboxymethylcellulose (REFRESH PLUS) 0.5 % SOLN Place 1 drop into both eyes 3 (three) times daily as needed (dry eyes).    [provider]  cromolyn (OPTICROM) 4 % ophthalmic solution Place 1 drop into both eyes 2 (two) times daily.     [provider]  donepezil (ARICEPT) 5 MG tablet Take 5 mg by mouth at bedtime.    [provider]  dorzolamide-timolol (COSOPT) 22.3-6.8 MG/ML ophthalmic solution Place 1 drop into both eyes 2 (two) times daily.    [provider]  doxycycline (VIBRAMYCIN) 100 MG capsule Take 100 mg by mouth 2 (two) times daily as needed (for tick bites). Take for 7 days    [provider]  fluticasone (FLONASE) 50 MCG/ACT nasal spray Place 2 sprays into both nostrils at bedtime as needed for allergies.     [provider]  gabapentin (NEURONTIN) 100 MG capsule  Take 100 mg by mouth every morning.  03/02/19   [provider]  levothyroxine (SYNTHROID) 150 MCG tablet Take 150 mcg by mouth daily before breakfast.  04/12/15   [provider]  losartan (COZAAR) 50 MG tablet Take 50 mg by mouth every evening.  01/11/19   [provider]  meclizine (ANTIVERT) 25 MG tablet Take 25 mg by mouth every morning.    [provider]  memantine (NAMENDA) 5 MG tablet Take 5 mg by mouth 2 (two) times daily.    [provider]  methocarbamol (ROBAXIN) 500 MG tablet Take 1 tablet (500 mg total) by mouth every 6 (six) hours as needed for muscle spasms. 04/15/20   Edmisten, Lyn Hollingshead, PA  Multiple Vitamins-Minerals (MULTIVITAMIN WITH MINERALS) tablet Take 1 tablet by mouth daily.    [provider]  oxyCODONE (OXY IR/ROXICODONE) 5 MG immediate release tablet Take 1-2 tablets (5-10 mg total) by mouth every 6 (six) hours as needed for severe pain. 04/15/20   Edmisten, Kristie L, PA  tiZANidine (ZANAFLEX) 4 MG tablet Take 4 mg by mouth at bedtime.    [provider]  traMADol (ULTRAM) 50 MG tablet Take 1-2 tablets (50-100 mg total) by mouth every 6 (six) hours as needed for moderate pain. 04/15/20   Edmisten, Lyn Hollingshead, PA    Family History Family History  Problem Relation Age of Onset   Breast cancer Mother    Prostate cancer Father    Diabetes Sister    Breast cancer Sister    Diabetes Sister    Colon cancer Sister     Social History Social History   Tobacco Use   Smoking status: Never   Smokeless tobacco: Never  Vaping Use   Vaping Use: Never used  Substance Use Topics   Alcohol use: Yes    Alcohol/week: 2.0 - 3.0 standard drinks    Types: 2 - 3 Cans of beer per week    Comment: rare use of whiskey- q 6 months    Drug use: No     Allergies   Duloxetine, Cefuroxime axetil [ceftin], Gabapentin, Shellfish allergy, Sulfa antibiotics, and Latex   Review of Systems Review of Systems Pertinent  negatives listed in HPI   Physical Exam Triage Vital Signs ED Triage Vitals  Enc Vitals Group     BP 10/07/20 0838 (!) 143/91     Pulse Rate 10/07/20 0838 65     Resp 10/07/20 0838 16     Temp 10/07/20 0838 98.5 F (36.9 C)     Temp Source 10/07/20 0838 Oral     SpO2 10/07/20 0838 97 %     Weight --      Height --      Head Circumference --      Peak Flow --      Pain Score 10/07/20 0836 0     Pain Loc --      Pain Edu? --      Excl. in GC? --    No data found.  Updated Vital Signs BP (!) 143/91 (BP Location: Right Arm)   Pulse 65   Temp 98.5 F (36.9 C) (Oral)   Resp 16   SpO2 97%   Visual Acuity Right Eye Distance:   Left Eye Distance:   Bilateral Distance:    Right Eye Near:   Left Eye Near:    Bilateral Near:     Physical Exam HENT:     Head: Normocephalic.  Eyes:      Comments: Upper right eyelid swelling with diffuse edema and ecchymosis, and abrasion inner right upper nasal bridge   Cardiovascular:     Rate and Rhythm: Normal rate and regular rhythm.  Pulmonary:     Effort: Pulmonary effort is normal.     Breath sounds: Normal breath sounds.  Skin:    General: Skin is warm.     Capillary Refill: Capillary refill takes less than 2 seconds.  Neurological:     General: No focal deficit present.     Mental Status: He is alert and oriented to person, place, and time. Mental status is at baseline.     Motor: No weakness.     Coordination: Coordination normal.     Gait: Gait normal.  Psychiatric:        Mood and Affect: Mood  normal.        Behavior: Behavior normal.        Thought Content: Thought content normal.        Judgment: Judgment normal.     UC Treatments / Results  Labs (all labs ordered are listed, but only abnormal results are displayed) Labs Reviewed - No data to display  EKG   Radiology No results found.  Procedures Procedures (including critical care time)  Medications Ordered in UC Medications - No data to  display  Initial Impression / Assessment and Plan / UC Course  I have reviewed the triage vital signs and the nursing notes.  Pertinent labs & imaging results that were available during my care of the patient were reviewed by me and considered in my medical decision making (see chart for details).  Facial lacerations, closed by primary intention.  No active bleeding.  Cleansed areas further secured to prevent dehiscence with Steri-Strips.  Patient's neurological status is within normal parameters.  Did caution patient if he experienced any, nausea, vomiting or dizziness. Return precautions given. Final Clinical Impressions(s) / UC Diagnoses   Final diagnoses:  Facial laceration, initial encounter  Fall, initial encounter   Discharge Instructions   None    ED Prescriptions   None    PDMP not reviewed this encounter.   Bing Neighbors, [place] 10/07/20 [phone]

## 2020-10-07 NOTE — ED Triage Notes (Signed)
Patient presents to Urgent Care with complains of a fall. He reports he slipped and landed on his face yesterday about 6 pm. Abrasion located on right eye brow area. Pt states he is not on a blood thinner. Treating pain with his daily oxy.   Denies changes in vision, n/v, or LOC.

## 2020-11-05 DIAGNOSIS — H40033 Anatomical narrow angle, bilateral: Secondary | ICD-10-CM | POA: Diagnosis not present

## 2020-11-05 DIAGNOSIS — H2513 Age-related nuclear cataract, bilateral: Secondary | ICD-10-CM | POA: Diagnosis not present

## 2020-12-01 DIAGNOSIS — H1013 Acute atopic conjunctivitis, bilateral: Secondary | ICD-10-CM | POA: Diagnosis not present

## 2020-12-16 DIAGNOSIS — E039 Hypothyroidism, unspecified: Secondary | ICD-10-CM | POA: Diagnosis not present

## 2020-12-16 DIAGNOSIS — I10 Essential (primary) hypertension: Secondary | ICD-10-CM | POA: Diagnosis not present

## 2020-12-16 DIAGNOSIS — G894 Chronic pain syndrome: Secondary | ICD-10-CM | POA: Diagnosis not present

## 2020-12-16 DIAGNOSIS — E538 Deficiency of other specified B group vitamins: Secondary | ICD-10-CM | POA: Diagnosis not present

## 2020-12-16 DIAGNOSIS — G4733 Obstructive sleep apnea (adult) (pediatric): Secondary | ICD-10-CM | POA: Diagnosis not present

## 2020-12-16 DIAGNOSIS — R413 Other amnesia: Secondary | ICD-10-CM | POA: Diagnosis not present

## 2021-01-12 DIAGNOSIS — I10 Essential (primary) hypertension: Secondary | ICD-10-CM | POA: Diagnosis not present

## 2021-01-12 DIAGNOSIS — G4733 Obstructive sleep apnea (adult) (pediatric): Secondary | ICD-10-CM | POA: Diagnosis not present

## 2021-01-12 DIAGNOSIS — M1990 Unspecified osteoarthritis, unspecified site: Secondary | ICD-10-CM | POA: Diagnosis not present

## 2021-01-12 DIAGNOSIS — G629 Polyneuropathy, unspecified: Secondary | ICD-10-CM | POA: Diagnosis not present

## 2021-01-12 DIAGNOSIS — E039 Hypothyroidism, unspecified: Secondary | ICD-10-CM | POA: Diagnosis not present

## 2021-01-12 DIAGNOSIS — E785 Hyperlipidemia, unspecified: Secondary | ICD-10-CM | POA: Diagnosis not present

## 2021-01-12 DIAGNOSIS — J309 Allergic rhinitis, unspecified: Secondary | ICD-10-CM | POA: Diagnosis not present

## 2021-01-12 DIAGNOSIS — H409 Unspecified glaucoma: Secondary | ICD-10-CM | POA: Diagnosis not present

## 2021-01-12 DIAGNOSIS — G8929 Other chronic pain: Secondary | ICD-10-CM | POA: Diagnosis not present

## 2021-03-16 DIAGNOSIS — F329 Major depressive disorder, single episode, unspecified: Secondary | ICD-10-CM | POA: Diagnosis not present

## 2021-03-16 DIAGNOSIS — I10 Essential (primary) hypertension: Secondary | ICD-10-CM | POA: Diagnosis not present

## 2021-03-16 DIAGNOSIS — G894 Chronic pain syndrome: Secondary | ICD-10-CM | POA: Diagnosis not present

## 2021-03-16 DIAGNOSIS — E538 Deficiency of other specified B group vitamins: Secondary | ICD-10-CM | POA: Diagnosis not present

## 2021-03-16 DIAGNOSIS — R413 Other amnesia: Secondary | ICD-10-CM | POA: Diagnosis not present

## 2021-03-16 DIAGNOSIS — F39 Unspecified mood [affective] disorder: Secondary | ICD-10-CM | POA: Diagnosis not present

## 2021-03-16 DIAGNOSIS — G4733 Obstructive sleep apnea (adult) (pediatric): Secondary | ICD-10-CM | POA: Diagnosis not present

## 2021-05-08 IMAGING — DX DG PORTABLE PELVIS
1 series · 1 of 1 positions shown · non-contrast
Comparison: None

CLINICAL DATA: Left hip replacement

EXAM:
PORTABLE PELVIS 1-2 VIEWS

[pelvis ap]
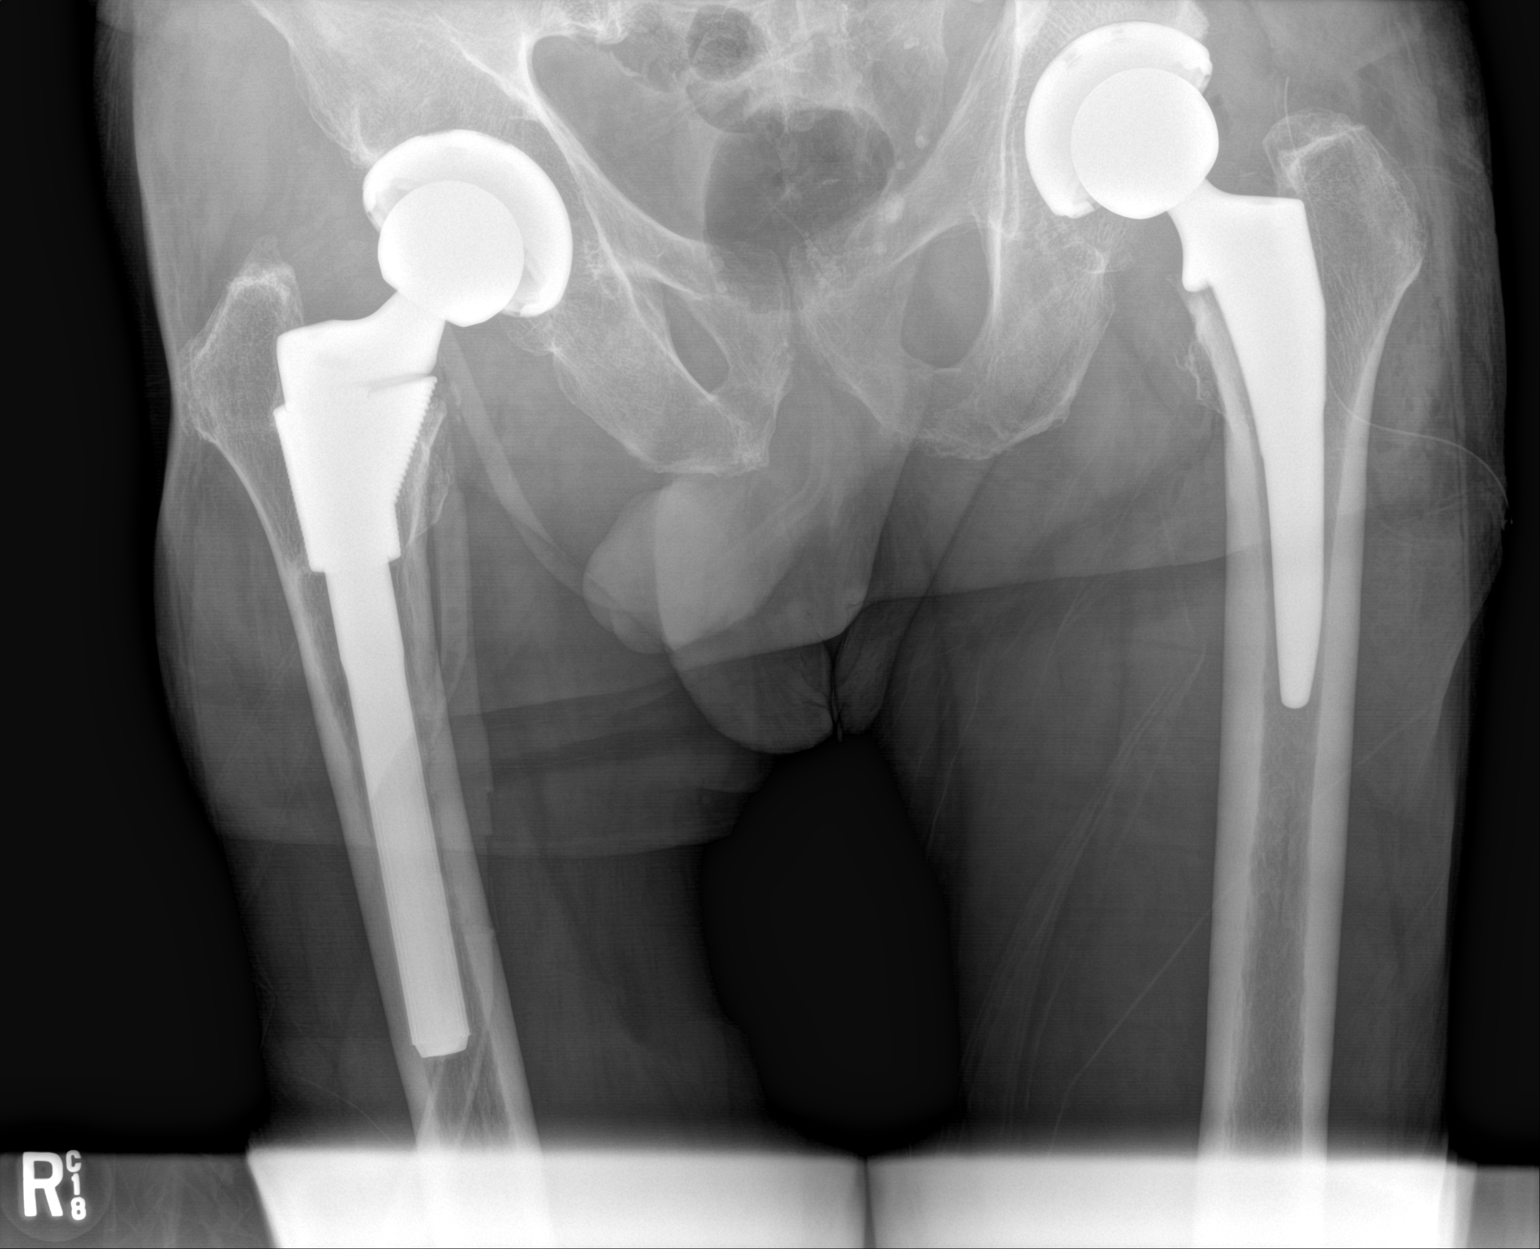

[1 of 1 positions shown; findings below may reference images not displayed]

FINDINGS: Post total left hip replacement. Long stem right hip replacement is
again noted.
IMPRESSION: Post total left hip replacement.

## 2021-08-27 DIAGNOSIS — Z0000 Encounter for general adult medical examination without abnormal findings: Secondary | ICD-10-CM | POA: Diagnosis not present

## 2021-08-27 DIAGNOSIS — G894 Chronic pain syndrome: Secondary | ICD-10-CM | POA: Diagnosis not present

## 2021-08-27 DIAGNOSIS — E039 Hypothyroidism, unspecified: Secondary | ICD-10-CM | POA: Diagnosis not present

## 2021-08-27 DIAGNOSIS — E538 Deficiency of other specified B group vitamins: Secondary | ICD-10-CM | POA: Diagnosis not present

## 2021-08-27 DIAGNOSIS — R413 Other amnesia: Secondary | ICD-10-CM | POA: Diagnosis not present

## 2021-08-27 DIAGNOSIS — M179 Osteoarthritis of knee, unspecified: Secondary | ICD-10-CM | POA: Diagnosis not present

## 2021-08-27 DIAGNOSIS — R270 Ataxia, unspecified: Secondary | ICD-10-CM | POA: Diagnosis not present

## 2021-08-27 DIAGNOSIS — E559 Vitamin D deficiency, unspecified: Secondary | ICD-10-CM | POA: Diagnosis not present

## 2021-08-27 DIAGNOSIS — G4733 Obstructive sleep apnea (adult) (pediatric): Secondary | ICD-10-CM | POA: Diagnosis not present

## 2021-08-27 DIAGNOSIS — I10 Essential (primary) hypertension: Secondary | ICD-10-CM | POA: Diagnosis not present

## 2021-08-27 DIAGNOSIS — F322 Major depressive disorder, single episode, severe without psychotic features: Secondary | ICD-10-CM | POA: Diagnosis not present

## 2021-09-04 DIAGNOSIS — E039 Hypothyroidism, unspecified: Secondary | ICD-10-CM | POA: Diagnosis not present

## 2021-09-04 DIAGNOSIS — I10 Essential (primary) hypertension: Secondary | ICD-10-CM | POA: Diagnosis not present

## 2021-09-04 DIAGNOSIS — Z79899 Other long term (current) drug therapy: Secondary | ICD-10-CM | POA: Diagnosis not present

## 2021-09-04 DIAGNOSIS — E538 Deficiency of other specified B group vitamins: Secondary | ICD-10-CM | POA: Diagnosis not present

## 2021-09-04 DIAGNOSIS — E291 Testicular hypofunction: Secondary | ICD-10-CM | POA: Diagnosis not present

## 2021-09-04 DIAGNOSIS — R413 Other amnesia: Secondary | ICD-10-CM | POA: Diagnosis not present

## 2021-09-04 DIAGNOSIS — E559 Vitamin D deficiency, unspecified: Secondary | ICD-10-CM | POA: Diagnosis not present

## 2021-12-16 DIAGNOSIS — G894 Chronic pain syndrome: Secondary | ICD-10-CM | POA: Diagnosis not present

## 2021-12-17 DIAGNOSIS — H0102A Squamous blepharitis right eye, upper and lower eyelids: Secondary | ICD-10-CM | POA: Diagnosis not present

## 2021-12-17 DIAGNOSIS — H1045 Other chronic allergic conjunctivitis: Secondary | ICD-10-CM | POA: Diagnosis not present

## 2021-12-17 DIAGNOSIS — H401134 Primary open-angle glaucoma, bilateral, indeterminate stage: Secondary | ICD-10-CM | POA: Diagnosis not present

## 2021-12-17 DIAGNOSIS — H0102B Squamous blepharitis left eye, upper and lower eyelids: Secondary | ICD-10-CM | POA: Diagnosis not present

## 2021-12-17 DIAGNOSIS — H25013 Cortical age-related cataract, bilateral: Secondary | ICD-10-CM | POA: Diagnosis not present

## 2021-12-17 DIAGNOSIS — H2513 Age-related nuclear cataract, bilateral: Secondary | ICD-10-CM | POA: Diagnosis not present

## 2021-12-17 DIAGNOSIS — H04123 Dry eye syndrome of bilateral lacrimal glands: Secondary | ICD-10-CM | POA: Diagnosis not present

## 2022-02-04 DIAGNOSIS — H0102A Squamous blepharitis right eye, upper and lower eyelids: Secondary | ICD-10-CM | POA: Diagnosis not present

## 2022-02-04 DIAGNOSIS — H04123 Dry eye syndrome of bilateral lacrimal glands: Secondary | ICD-10-CM | POA: Diagnosis not present

## 2022-02-04 DIAGNOSIS — H2513 Age-related nuclear cataract, bilateral: Secondary | ICD-10-CM | POA: Diagnosis not present

## 2022-02-04 DIAGNOSIS — H401134 Primary open-angle glaucoma, bilateral, indeterminate stage: Secondary | ICD-10-CM | POA: Diagnosis not present

## 2022-02-04 DIAGNOSIS — H1045 Other chronic allergic conjunctivitis: Secondary | ICD-10-CM | POA: Diagnosis not present

## 2022-02-04 DIAGNOSIS — H0102B Squamous blepharitis left eye, upper and lower eyelids: Secondary | ICD-10-CM | POA: Diagnosis not present

## 2022-04-09 DIAGNOSIS — R252 Cramp and spasm: Secondary | ICD-10-CM | POA: Diagnosis not present

## 2022-04-09 DIAGNOSIS — G894 Chronic pain syndrome: Secondary | ICD-10-CM | POA: Diagnosis not present

## 2022-07-20 ENCOUNTER — Telehealth: Payer: Self-pay

## 2022-07-20 NOTE — Patient Outreach (Signed)
  Care Coordination   07/20/2022 Name: Jason Friedman. MRN: [id number] DOB: [birth year]   Care Coordination Outreach Attempts:  An unsuccessful telephone outreach was attempted today to offer the patient information about available care coordination services as a benefit of their health plan.   Follow Up Plan:  Additional outreach attempts will be made to offer the patient care coordination information and services.   Encounter Outcome:  No Answer   Care Coordination Interventions:  No, not indicated    SIG  Peter Garter RN, BSN,CCM, CDE Care Management Coordinator [place] [facility] [phone]

## 2022-07-30 ENCOUNTER — Telehealth: Payer: Self-pay

## 2022-07-30 NOTE — Patient Outreach (Signed)
  Care Coordination   07/30/2022 Name: Jason Friedman. MRN: [id number] DOB: [birth year]   Care Coordination Outreach Attempts:  A second unsuccessful outreach was attempted today to offer the patient with information about available care coordination services as a benefit of their health plan.     Follow Up Plan:  Additional outreach attempts will be made to offer the patient care coordination information and services.   Encounter Outcome:  No Answer   Care Coordination Interventions:  No, not indicated    SIG  Dudley Major RN, BSN,CCM, CDE Care Management Coordinator [facility] [phone]

## 2022-08-11 ENCOUNTER — Ambulatory Visit
Admission: RE | Admit: 2022-08-11 | Discharge: 2022-08-11 | Disposition: A | Discharge status: Home / Self Care | Payer: Medicare PPO | Source: Ambulatory Visit | Attending: Internal Medicine | Admitting: Internal Medicine

## 2022-08-11 ENCOUNTER — Other Ambulatory Visit: Payer: Self-pay | Admitting: Internal Medicine

## 2022-08-11 DIAGNOSIS — G894 Chronic pain syndrome: Secondary | ICD-10-CM | POA: Diagnosis not present

## 2022-08-11 DIAGNOSIS — R252 Cramp and spasm: Secondary | ICD-10-CM | POA: Diagnosis not present

## 2022-08-11 DIAGNOSIS — Z79899 Other long term (current) drug therapy: Secondary | ICD-10-CM | POA: Diagnosis not present

## 2022-08-11 DIAGNOSIS — R079 Chest pain, unspecified: Secondary | ICD-10-CM

## 2022-08-11 DIAGNOSIS — F1190 Opioid use, unspecified, uncomplicated: Secondary | ICD-10-CM | POA: Diagnosis not present

## 2022-08-12 ENCOUNTER — Telehealth: Payer: Self-pay

## 2022-08-12 NOTE — Patient Outreach (Signed)
  Care Coordination   Initial Visit Note   08/12/2022 Name: Massey Ruhland. MRN: [id number] DOB: 10/21/1940  Jonatan Wilsey. is a 82 y.o. year old male who sees Emilio Aspen, MD for primary care. I spoke with  [name]. by phone today.  What matters to the patients health and wellness today?  No concerns today.  States he is very active on his [place].      Goals Addressed             This Visit's Progress    COMPLETED: Care Coordination Activities- No Follow Up Required       Interventions Today    Flowsheet Row Most Recent Value  General Interventions   General Interventions Discussed/Reviewed General Interventions Discussed, Doctor Visits  Doctor Visits Discussed/Reviewed Doctor Visits Discussed, Annual Wellness Visits, PCP  PCP/Specialist Visits Compliance with follow-up visit  Exercise Interventions   Exercise Discussed/Reviewed Exercise Discussed  Education Interventions   Education Provided Provided Education  Provided Verbal Education On Exercise, Other  [care coordination services]  Pharmacy Interventions   Pharmacy Dicussed/Reviewed Pharmacy Topics Discussed  Safety Interventions   Safety Discussed/Reviewed Safety Discussed, Home Safety              SDOH assessments and interventions completed:  Yes  SDOH Interventions Today    Flowsheet Row Most Recent Value  SDOH Interventions   Food Insecurity Interventions Intervention Not Indicated  Housing Interventions Intervention Not Indicated  Transportation Interventions Intervention Not Indicated  Utilities Interventions Intervention Not Indicated        Care Coordination Interventions:  Yes, provided   Follow up plan: No further intervention required.   Encounter Outcome:  Pt. Visit Completed  Dudley Major RN, BSN,CCM, CDE Care Management Coordinator [facility] [phone]

## 2022-08-12 NOTE — Patient Instructions (Signed)
Visit Information  Thank you for taking time to visit with me today. Please don't hesitate to contact me if I can be of assistance to you.   Following are the goals we discussed today:   Goals Addressed             This Visit's Progress    COMPLETED: Care Coordination Activities- No Follow Up Required       Interventions Today    Flowsheet Row Most Recent Value  General Interventions   General Interventions Discussed/Reviewed General Interventions Discussed, Doctor Visits  Doctor Visits Discussed/Reviewed Doctor Visits Discussed, Annual Wellness Visits, PCP  PCP/Specialist Visits Compliance with follow-up visit  Exercise Interventions   Exercise Discussed/Reviewed Exercise Discussed  Education Interventions   Education Provided Provided Education  Provided Verbal Education On Exercise, Other  [care coordination services]  Pharmacy Interventions   Pharmacy Dicussed/Reviewed Pharmacy Topics Discussed  Safety Interventions   Safety Discussed/Reviewed Safety Discussed, Home Safety             If you are experiencing a Mental Health or Behavioral Health Crisis or need someone to talk to, please call the Suicide and Crisis Lifeline: 988 call the [place] National Suicide Prevention Lifeline: (718) 028-0453 or TTY: (438) 219-5041 TTY 458-541-3911) to talk to a trained counselor call 1-800-273-TALK (toll free, 24 hour hotline) go to [facility] [address] [place] [phone]) call 911   Patient verbalizes understanding of instructions and care plan provided today and agrees to view in MyChart. Active MyChart status and patient understanding of how to access instructions and care plan via MyChart confirmed with patient.     No further follow up required:    SIGNATURE Dudley Major RN, Maximiano Coss, CDE Care Management Coordinator [facility] [phone]

## 2022-12-20 DIAGNOSIS — R079 Chest pain, unspecified: Secondary | ICD-10-CM | POA: Diagnosis not present

## 2022-12-20 DIAGNOSIS — E538 Deficiency of other specified B group vitamins: Secondary | ICD-10-CM | POA: Diagnosis not present

## 2022-12-20 DIAGNOSIS — F1190 Opioid use, unspecified, uncomplicated: Secondary | ICD-10-CM | POA: Diagnosis not present

## 2022-12-20 DIAGNOSIS — D510 Vitamin B12 deficiency anemia due to intrinsic factor deficiency: Secondary | ICD-10-CM | POA: Diagnosis not present

## 2022-12-20 DIAGNOSIS — G894 Chronic pain syndrome: Secondary | ICD-10-CM | POA: Diagnosis not present

## 2022-12-20 DIAGNOSIS — E291 Testicular hypofunction: Secondary | ICD-10-CM | POA: Diagnosis not present

## 2022-12-20 DIAGNOSIS — Z125 Encounter for screening for malignant neoplasm of prostate: Secondary | ICD-10-CM | POA: Diagnosis not present

## 2022-12-20 DIAGNOSIS — Z79899 Other long term (current) drug therapy: Secondary | ICD-10-CM | POA: Diagnosis not present

## 2022-12-20 DIAGNOSIS — I10 Essential (primary) hypertension: Secondary | ICD-10-CM | POA: Diagnosis not present

## 2022-12-20 DIAGNOSIS — E039 Hypothyroidism, unspecified: Secondary | ICD-10-CM | POA: Diagnosis not present

## 2022-12-20 DIAGNOSIS — E559 Vitamin D deficiency, unspecified: Secondary | ICD-10-CM | POA: Diagnosis not present

## 2022-12-20 DIAGNOSIS — R252 Cramp and spasm: Secondary | ICD-10-CM | POA: Diagnosis not present

## 2023-01-28 DIAGNOSIS — Z96651 Presence of right artificial knee joint: Secondary | ICD-10-CM | POA: Diagnosis not present

## 2023-01-28 DIAGNOSIS — M1712 Unilateral primary osteoarthritis, left knee: Secondary | ICD-10-CM | POA: Diagnosis not present

## 2023-02-03 DIAGNOSIS — E291 Testicular hypofunction: Secondary | ICD-10-CM | POA: Diagnosis not present

## 2023-02-03 DIAGNOSIS — E039 Hypothyroidism, unspecified: Secondary | ICD-10-CM | POA: Diagnosis not present

## 2023-02-14 DIAGNOSIS — M47896 Other spondylosis, lumbar region: Secondary | ICD-10-CM | POA: Diagnosis not present

## 2023-02-17 DIAGNOSIS — M5416 Radiculopathy, lumbar region: Secondary | ICD-10-CM | POA: Diagnosis not present

## 2023-03-01 DIAGNOSIS — Z833 Family history of diabetes mellitus: Secondary | ICD-10-CM | POA: Diagnosis not present

## 2023-03-01 DIAGNOSIS — M47816 Spondylosis without myelopathy or radiculopathy, lumbar region: Secondary | ICD-10-CM | POA: Diagnosis not present

## 2023-03-01 DIAGNOSIS — Z96649 Presence of unspecified artificial hip joint: Secondary | ICD-10-CM | POA: Diagnosis not present

## 2023-03-01 DIAGNOSIS — G629 Polyneuropathy, unspecified: Secondary | ICD-10-CM | POA: Diagnosis not present

## 2023-03-01 DIAGNOSIS — F1190 Opioid use, unspecified, uncomplicated: Secondary | ICD-10-CM | POA: Diagnosis not present

## 2023-03-01 DIAGNOSIS — F0280 Dementia in other diseases classified elsewhere without behavioral disturbance: Secondary | ICD-10-CM | POA: Diagnosis not present

## 2023-03-01 DIAGNOSIS — I10 Essential (primary) hypertension: Secondary | ICD-10-CM | POA: Diagnosis not present

## 2023-03-01 DIAGNOSIS — N529 Male erectile dysfunction, unspecified: Secondary | ICD-10-CM | POA: Diagnosis not present

## 2023-03-01 DIAGNOSIS — Z79890 Hormone replacement therapy (postmenopausal): Secondary | ICD-10-CM | POA: Diagnosis not present

## 2023-03-01 DIAGNOSIS — H409 Unspecified glaucoma: Secondary | ICD-10-CM | POA: Diagnosis not present

## 2023-03-01 DIAGNOSIS — M62838 Other muscle spasm: Secondary | ICD-10-CM | POA: Diagnosis not present

## 2023-03-01 DIAGNOSIS — H269 Unspecified cataract: Secondary | ICD-10-CM | POA: Diagnosis not present

## 2023-03-01 DIAGNOSIS — J309 Allergic rhinitis, unspecified: Secondary | ICD-10-CM | POA: Diagnosis not present

## 2023-03-01 DIAGNOSIS — E039 Hypothyroidism, unspecified: Secondary | ICD-10-CM | POA: Diagnosis not present

## 2023-03-01 DIAGNOSIS — M1990 Unspecified osteoarthritis, unspecified site: Secondary | ICD-10-CM | POA: Diagnosis not present

## 2023-03-01 DIAGNOSIS — M48061 Spinal stenosis, lumbar region without neurogenic claudication: Secondary | ICD-10-CM | POA: Diagnosis not present

## 2023-03-01 DIAGNOSIS — E785 Hyperlipidemia, unspecified: Secondary | ICD-10-CM | POA: Diagnosis not present

## 2023-09-07 DIAGNOSIS — M1712 Unilateral primary osteoarthritis, left knee: Secondary | ICD-10-CM | POA: Diagnosis not present

## 2023-10-11 DIAGNOSIS — M25562 Pain in left knee: Secondary | ICD-10-CM | POA: Diagnosis not present

## 2023-10-11 DIAGNOSIS — M1712 Unilateral primary osteoarthritis, left knee: Secondary | ICD-10-CM | POA: Diagnosis not present

## 2023-10-11 DIAGNOSIS — G8929 Other chronic pain: Secondary | ICD-10-CM | POA: Diagnosis not present

## 2023-10-11 DIAGNOSIS — M25662 Stiffness of left knee, not elsewhere classified: Secondary | ICD-10-CM | POA: Diagnosis not present

## 2023-10-21 DIAGNOSIS — M1712 Unilateral primary osteoarthritis, left knee: Secondary | ICD-10-CM | POA: Diagnosis not present

## 2023-10-24 NOTE — Progress Notes (Signed)
 COVID Vaccine Completed:  Date of COVID positive in last 90 days:  PCP - Dorn Henderson,MD LOV note in media tab 09/14/23 Cardiologist -   Medical clearance  in media tab dated 09/14/23  Chest x-ray - n/a EKG - 10/25/23 Epic/chart Stress Test - n/a ECHO - 05/19/16 Epic Cardiac Cath - n/a Pacemaker/ICD device last checked: n/a Spinal Cord Stimulator: n/a  Bowel Prep - no  Sleep Study - yes CPAP - no lost weight  Fasting Blood Sugar - n/a Checks Blood Sugar _____ times a day  Last dose of GLP1 agonist-  N/A GLP1 instructions:  Do not take after     Last dose of SGLT-2 inhibitors-  N/A SGLT-2 instructions:  Do not take after     Blood Thinner Instructions:  Last dose: n/a Time: Aspirin  Instructions: Last Dose:  Activity level: Can go up a flight of stairs and perform activities of daily living without stopping and without symptoms of chest pain or shortness of breath.  Anesthesia review: HTN, anemia, diaphragm paralysis, COPD, OSA, cervical fusion  Patient denies shortness of breath, fever, cough and chest pain at PAT appointment  Patient verbalized understanding of instructions that were given to them at the PAT appointment. Patient was also instructed that they will need to review over the PAT instructions again at home before surgery.

## 2023-10-24 NOTE — Patient Instructions (Signed)
 SURGICAL WAITING ROOM VISITATION  Patients having surgery or a procedure may have no more than 2 support people in the waiting area - these visitors may rotate.    Children under the age of 27 must have an adult with them who is not the patient.  Visitors with respiratory illnesses are discouraged from visiting and should remain at home.  If the patient needs to stay at the hospital during part of their recovery, the visitor guidelines for inpatient rooms apply. Pre-op nurse will coordinate an appropriate time for 1 support person to accompany patient in pre-op.  This support person may not rotate.    Please refer to the [facility] website for the visitor guidelines for Inpatients (after your surgery is over and you are in a regular room).    Your procedure is scheduled on: 11/07/23   Report to [facility] Main Entrance    Report to admitting at 8:00 AM   Call this number if you have problems the morning of surgery [phone]   Do not eat food :After Midnight.   After Midnight you may have the following liquids until 7:30 AM DAY OF SURGERY  Water  Non-Citrus Juices (without pulp, NO RED-Apple, White grape, White cranberry) Black Coffee (NO MILK/CREAM OR CREAMERS, sugar ok)  Clear Tea (NO MILK/CREAM OR CREAMERS, sugar ok) regular and decaf                             Plain Jell-O (NO RED)                                           Fruit ices (not with fruit pulp, NO RED)                                     Popsicles (NO RED)                                                               Sports drinks like Gatorade (NO RED)                  The day of surgery:  Drink ONE (1) Pre-Surgery Clear Ensure at 7:30 AM the morning of surgery. Drink in one sitting. Do not sip.  This drink was given to you during your hospital  pre-op appointment visit. Nothing else to drink after completing the  Pre-Surgery Clear Ensure.          If you have questions, please contact your  surgeon's office.   FOLLOW BOWEL PREP AND ANY ADDITIONAL PRE OP INSTRUCTIONS YOU RECEIVED FROM YOUR SURGEON'S OFFICE!!!     Oral Hygiene is also important to reduce your risk of infection.                                    Remember - BRUSH YOUR TEETH THE MORNING OF SURGERY WITH YOUR REGULAR TOOTHPASTE  DENTURES WILL BE REMOVED PRIOR TO SURGERY PLEASE DO NOT APPLY Poly grip  OR ADHESIVES!!!   Stop all vitamins and herbal supplements 7 days before surgery.   Take these medicines the morning of surgery with A SIP OF WATER : Eye drops, Gabapentin , Levothyroxine , Hydrocodone, Flonase              You may not have any metal on your body including jewelry, and body piercing             Do not wear, lotions, powders, cologne, or deodorant              Men may shave face and neck.   Do not bring valuables to the hospital. [place] IS NOT             RESPONSIBLE   FOR VALUABLES.   Contacts, glasses, dentures or bridgework may not be worn into surgery.   Bring small overnight bag day of surgery.   DO NOT BRING YOUR HOME MEDICATIONS TO THE HOSPITAL. PHARMACY WILL DISPENSE MEDICATIONS LISTED ON YOUR MEDICATION LIST TO YOU DURING YOUR ADMISSION IN THE HOSPITAL!    Special Instructions: Bring a copy of your healthcare power of attorney and living will documents the day of surgery if you haven't scanned them before.              Please read over the following fact sheets you were given: IF YOU HAVE QUESTIONS ABOUT YOUR PRE-OP INSTRUCTIONS PLEASE CALL [phone]   If you received a COVID test during your pre-op visit  it is requested that you wear a mask when out in public, stay away from anyone that may not be feeling well and notify your surgeon if you develop symptoms. If you test positive for Covid or have been in contact with anyone that has tested positive in the last 10 days please notify you surgeon.      Pre-operative 5 CHG Bath Instructions   You can play a key role in  reducing the risk of infection after surgery. Your skin needs to be as free of germs as possible. You can reduce the number of germs on your skin by washing with CHG (chlorhexidine  gluconate) soap before surgery. CHG is an antiseptic soap that kills germs and continues to kill germs even after washing.   DO NOT use if you have an allergy to chlorhexidine /CHG or antibacterial soaps. If your skin becomes reddened or irritated, stop using the CHG and notify one of our RNs at [phone].   Please shower with the CHG soap starting 4 days before surgery using the following schedule:     Please keep in mind the following:  DO NOT shave, including legs and underarms, starting the day of your first shower.   You may shave your face at any point before/day of surgery.  Place clean sheets on your bed the day you start using CHG soap. Use a clean washcloth (not used since being washed) for each shower. DO NOT sleep with pets once you start using the CHG.   CHG Shower Instructions:  If you choose to wash your hair and private area, wash first with your normal shampoo/soap.  After you use shampoo/soap, rinse your hair and body thoroughly to remove shampoo/soap residue.  Turn the water  OFF and apply about 3 tablespoons (45 ml) of CHG soap to a CLEAN washcloth.  Apply CHG soap ONLY FROM YOUR NECK DOWN TO YOUR TOES (washing for 3-5 minutes)  DO NOT use CHG soap on face, private areas, open wounds, or sores.  Pay special attention  to the area where your surgery is being performed.  If you are having back surgery, having someone wash your back for you may be helpful. Wait 2 minutes after CHG soap is applied, then you may rinse off the CHG soap.  Pat dry with a clean towel  Put on clean clothes/pajamas   If you choose to wear lotion, please use ONLY the CHG-compatible lotions on the back of this paper.     Additional instructions for the day of surgery: DO NOT APPLY any lotions, deodorants, cologne, or  perfumes.   Put on clean/comfortable clothes.  Brush your teeth.  Ask your nurse before applying any prescription medications to the skin.      CHG Compatible Lotions   Aveeno Moisturizing lotion  Cetaphil Moisturizing Cream  Cetaphil Moisturizing Lotion  Clairol Herbal Essence Moisturizing Lotion, Dry Skin  Clairol Herbal Essence Moisturizing Lotion, Extra Dry Skin  Clairol Herbal Essence Moisturizing Lotion, Normal Skin  Curel Age Defying Therapeutic Moisturizing Lotion with Alpha Hydroxy  Curel Extreme Care Body Lotion  Curel Soothing Hands Moisturizing Hand Lotion  Curel Therapeutic Moisturizing Cream, Fragrance-Free  Curel Therapeutic Moisturizing Lotion, Fragrance-Free  Curel Therapeutic Moisturizing Lotion, Original Formula  Eucerin Daily Replenishing Lotion  Eucerin Dry Skin Therapy Plus Alpha Hydroxy Crme  Eucerin Dry Skin Therapy Plus Alpha Hydroxy Lotion  Eucerin Original Crme  Eucerin Original Lotion  Eucerin Plus Crme Eucerin Plus Lotion  Eucerin TriLipid Replenishing Lotion  Keri Anti-Bacterial Hand Lotion  Keri Deep Conditioning Original Lotion Dry Skin Formula Softly Scented  Keri Deep Conditioning Original Lotion, Fragrance Free Sensitive Skin Formula  Keri Lotion Fast Absorbing Fragrance Free Sensitive Skin Formula  Keri Lotion Fast Absorbing Softly Scented Dry Skin Formula  Keri Original Lotion  Keri Skin Renewal Lotion Keri Silky Smooth Lotion  Keri Silky Smooth Sensitive Skin Lotion  Nivea Body Creamy Conditioning Oil  Nivea Body Extra Enriched Lotion  Nivea Body Original Lotion  Nivea Body Sheer Moisturizing Lotion Nivea Crme  Nivea Skin Firming Lotion  NutraDerm 30 Skin Lotion  NutraDerm Skin Lotion  NutraDerm Therapeutic Skin Cream  NutraDerm Therapeutic Skin Lotion  ProShield Protective Hand Cream  Provon moisturizing lotion   View Pre-Surgery Education  Videos:  [email or web address]     Incentive Spirometer  An incentive spirometer is a tool that can help keep your lungs clear and active. This tool measures how well you are filling your lungs with each breath. Taking long deep breaths may help reverse or decrease the chance of developing breathing (pulmonary) problems (especially infection) following: A long period of time when you are unable to move or be active. BEFORE THE PROCEDURE  If the spirometer includes an indicator to show your best effort, your nurse or respiratory therapist will set it to a desired goal. If possible, sit up straight or lean slightly forward. Try not to slouch. Hold the incentive spirometer in an upright position. INSTRUCTIONS FOR USE  Sit on the edge of your bed if possible, or sit up as far as you can in bed or on a chair. Hold the incentive spirometer in an upright position. Breathe out normally. Place the mouthpiece in your mouth and seal your lips tightly around it. Breathe in slowly and as deeply as possible, raising the piston or the ball toward the top of the column. Hold your breath for 3-5 seconds or for as long as possible. Allow the piston or ball to fall to the bottom of the column. Remove the mouthpiece from your mouth  and breathe out normally. Rest for a few seconds and repeat Steps 1 through 7 at least 10 times every 1-2 hours when you are awake. Take your time and take a few normal breaths between deep breaths. The spirometer may include an indicator to show your best effort. Use the indicator as a goal to work toward during each repetition. After each set of 10 deep breaths, practice coughing to be sure your lungs are clear. If you have an incision (the cut made at the time of surgery), support your incision when coughing by placing a pillow or rolled up towels firmly against it. Once you are able to get out of bed, walk around indoors  and cough well. You may stop using the incentive spirometer when instructed by your caregiver.  RISKS AND COMPLICATIONS Take your time so you do not get dizzy or light-headed. If you are in pain, you may need to take or ask for pain medication before doing incentive spirometry. It is harder to take a deep breath if you are having pain. AFTER USE Rest and breathe slowly and easily. It can be helpful to keep track of a log of your progress. Your caregiver can provide you with a simple table to help with this. If you are using the spirometer at home, follow these instructions: SEEK MEDICAL CARE IF:  You are having difficultly using the spirometer. You have trouble using the spirometer as often as instructed. Your pain medication is not giving enough relief while using the spirometer. You develop fever of 100.5 F (38.1 C) or higher. SEEK IMMEDIATE MEDICAL CARE IF:  You cough up bloody sputum that had not been present before. You develop fever of 102 F (38.9 C) or greater. You develop worsening pain at or near the incision site. MAKE SURE YOU:  Understand these instructions. Will watch your condition. Will get help right away if you are not doing well or get worse. Document Released: 08/23/2006 Document Revised: 07/05/2011 Document Reviewed: 10/24/2006 [facility] Patient Information 2014 [place], [place].   ________________________________________________________________________

## 2023-10-24 NOTE — H&P (Signed)
 TOTAL KNEE ADMISSION H&P  Patient is being admitted for left total knee arthroplasty.  Subjective:  Chief Complaint: Left knee pain.  HPI: Jason Friedman., 83 y.o. male has a history of pain and functional disability in the left knee due to arthritis and has failed non-surgical conservative treatments for greater than 12 weeks to include NSAID's and/or analgesics, corticosteriod injections, viscosupplementation injections, and activity modification. Onset of symptoms was gradual, starting several years ago with gradually worsening course since that time. The patient noted no past surgery on the left knee.  Patient currently rates pain in the left knee at 8 out of 10 with activity. Patient has night pain, worsening of pain with activity and weight bearing, and pain that interferes with activities of daily living. Patient has evidence of bone-on-bone arthritis in the medial compartment on the lateral view. The patellofemoral compartment is also nearly bone-on-bone by imaging studies. There is no active infection.  Patient Active Problem List   Diagnosis Date Noted   OA (osteoarthritis) of knee 04/14/2020   Primary osteoarthritis of right knee 04/14/2020   Osteoarthritis of left hip 03/19/2019   Cervical pseudoarthrosis (HCC) 05/08/2018   Cervical stenosis of spine 03/15/2018   Other spondylosis with myelopathy, cervical region 03/09/2018   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 , peripheri neuropathy   COPD (chronic obstructive pulmonary disease) (HCC)    mild copd per lov note dr theotis 04-16-11 on chart   Difficult intubation 1980s   difficult d/t anterior larynx,  successful intubation using Glidescope 04/07/17   Fracture of cervical vertebrae, multiple (HCC)  in high school   2 cervical vertebrae fx, 3 dislocated cervical vertebrae   Grade I diastolic dysfunction    noted on ECHO   Heart abnormality    heart anatomicall rotated, causes abnormal ekg   Hypertension    hx on, none current   Hypogonadism in male    Hypothyroidism    Impaired memory    x 2 yrs ago after neck injury   Insomnia    Paralysis, diaphragm    Left   PONV (postoperative nausea and vomiting)    likes scopolamine  patch, vertigo ,pt. reports that anesthesia has had problem finding my airway   Prostate irregularity    nonfuctional prostate, on testerone gel bid   Sigmoid diverticulitis    Sleep apnea    does not use due to weight loss, does not need now, not able to use CPAP, last sleep study- 2018    Past Surgical History:  Procedure Laterality Date   ANTERIOR CERVICAL DECOMP/DISCECTOMY FUSION N/A 03/15/2018   Procedure: CERVICAL FOUR-FIVE, CERVICAL FIVE-SIX ANTERIOR CERVICAL DECOMPRESSION/DISCECTOMY FUSION ALLOGRAFT AND PLATE;  Surgeon: Barbarann Oneil BROCKS, MD;  Location: [facility] OR;  Service: Orthopedics;  Laterality: N/A;  CERVICAL FOUR-FIVE, CERVICAL FIVE-SIX ANTERIOR CERVICAL DECOMPRESSION/DISCECTOMY FUSION ALLOGRAFT AND PLATE   CARPAL TUNNEL RELEASE Left    hand is now numb   piliodional cyst  yrs ago   done x 3   POSTERIOR CERVICAL FUSION/FORAMINOTOMY N/A 05/08/2018   Procedure: C4-C6 POSTERIOR CERVICAL FUSION, WIRING, ILIAC BONE MARROW ASPIRATE;  Surgeon: Barbarann Oneil  C, MD;  Location: [facility] OR;  Service: Orthopedics;  Laterality: N/A;   REVERSE SHOULDER ARTHROPLASTY Right 12/11/2019   Procedure: REVERSE SHOULDER ARTHROPLASTY;  Surgeon: Melita Drivers, MD;  Location: [facility] ORS;  Service: Orthopedics;  Laterality: Right;    right shoulder arthroscopy  Mar 02, 2011   TONSILLECTOMY     TOTAL HIP ARTHROPLASTY  08/23/2011   Procedure: TOTAL HIP ARTHROPLASTY;  Surgeon: Dempsey LULLA Moan, MD;  Location: [facility] ORS;  Service: Orthopedics;  Laterality: Right;   TOTAL HIP ARTHROPLASTY Left 03/19/2019   Procedure: TOTAL HIP ARTHROPLASTY ANTERIOR APPROACH;  Surgeon: Moan Dempsey, MD;  Location: [facility] OR[facility];  Service: Orthopedics;  Laterality: Left;    TOTAL KNEE ARTHROPLASTY Right 04/14/2020   Procedure: TOTAL KNEE ARTHROPLASTY;  Surgeon: Moan Dempsey, MD;  Location: [facility] OR[facility];  Service: Orthopedics;  Laterality: Right;    TOTAL SHOULDER ARTHROPLASTY Left 04/07/2017   Procedure: LEFT TOTAL SHOULDER ARTHROPLASTY;  Surgeon: Melita Drivers, MD;  Location: [facility] OR;  Service: Orthopedics;  Laterality: Left;    Prior to Admission medications   Medication Sig Start Date End Date Taking? Authorizing Provider  amLODipine  (NORVASC ) 5 MG tablet Take 5 mg by mouth every evening.  01/11/19   [provider]  amoxicillin (AMOXIL) 500 MG tablet Take 2,000 mg by mouth See admin instructions. Take 4 capsules (2000 mg) by mouth 1 hour prior to dental appointments. 12/20/18   [provider]  ANDROGEL  PUMP 20.25 MG/ACT (1.62%) GEL Apply 2.5 Pump topically 2 (two) times daily.  04/11/15   [provider]  Brinzolamide -Brimonidine  1-0.2 % SUSP Place 1 drop into both eyes 2 (two) times daily as needed (dry eyes).     [provider]  carboxymethylcellulose (REFRESH PLUS) 0.5 % SOLN Place 1 drop into both eyes 3 (three) times daily as needed (dry eyes).    [provider]  cromolyn  (OPTICROM ) 4 % ophthalmic solution Place 1 drop into both eyes 2 (two) times daily.     [provider]  donepezil  (ARICEPT ) 5 MG tablet Take 5 mg by mouth at bedtime.    [provider]  dorzolamide -timolol  (COSOPT ) 22.3-6.8 MG/ML ophthalmic solution Place 1 drop into both eyes 2 (two) times daily.    [provider]  doxycycline (VIBRAMYCIN) 100 MG capsule Take 100 mg by mouth 2 (two) times daily as needed (for tick bites). Take for 7 days     [provider]  fluticasone  (FLONASE ) 50 MCG/ACT nasal spray Place 2 sprays into both nostrils at bedtime as needed for allergies.     [provider]  gabapentin  (NEURONTIN ) 100 MG capsule Take 100 mg by mouth every morning.  03/02/19   [provider]  levothyroxine  (SYNTHROID ) 150 MCG tablet Take 150 mcg by mouth daily before breakfast.  04/12/15   [provider]  losartan  (COZAAR ) 50 MG tablet Take 50 mg by mouth every evening.  01/11/19   [provider]  meclizine  (ANTIVERT ) 25 MG tablet Take 25 mg by mouth every morning.    [provider]  memantine  (NAMENDA ) 5 MG tablet Take 5 mg by mouth 2 (two) times daily.    [provider]  methocarbamol  (ROBAXIN ) 500 MG tablet Take 1 tablet (500 mg total) by mouth every 6 (six) hours as needed for muscle spasms. 04/15/20   Edmisten, Roxie CROME, PA  Multiple Vitamins-Minerals (MULTIVITAMIN WITH MINERALS) tablet Take 1 tablet by mouth daily.    [provider]  oxyCODONE  (OXY IR/ROXICODONE )  5 MG immediate release tablet Take 1-2 tablets (5-10 mg total) by mouth every 6 (six) hours as needed for severe pain. 04/15/20   Edmisten, Kristie L, PA  tiZANidine  (ZANAFLEX ) 4 MG tablet Take 4 mg by mouth at bedtime.    [provider]  traMADol  (ULTRAM ) 50 MG tablet Take 1-2 tablets (50-100 mg total) by mouth every 6 (six) hours as needed for moderate pain. 04/15/20   Edmisten, Kristie L, PA    Allergies  Allergen Reactions   Duloxetine Other (See Comments)    Altered personality - didn't eat, pain all over   Cefuroxime Axetil [Ceftin] Other (See Comments)    Joint pain   Gabapentin  Other (See Comments)    Visual disturbance, hair loss   Shellfish Allergy Swelling    SWELLING REACTION UNSPECIFIED    Sulfa Antibiotics Other (See Comments)    UNSPECIFIED REACTION    Latex Other (See Comments)    Skin irritation     Social History   Socioeconomic History   Marital status:  Married    Spouse name: Not on file   Number of children: 4   Years of education: Not on file   Highest education level: Not on file  Occupational History   Occupation: Retired  Tobacco Use   Smoking status: Never   Smokeless tobacco: Never  Vaping Use   Vaping status: Never Used  Substance and Sexual Activity   Alcohol  use: Yes    Alcohol /week: 2.0 - 3.0 standard drinks of alcohol     Types: 2 - 3 Cans of beer per week    Comment: rare use of whiskey- q 6 months    Drug use: No   Sexual activity: Not on file  Other Topics Concern   Not on file  Social History Narrative   1 cup of a coffee a day   Social Drivers of Corporate investment banker Strain: Not on file  Food Insecurity: No Food Insecurity (08/12/2022)   Hunger Vital Sign    Worried About Running Out of Food in the Last Year: Never true    Ran Out of Food in the Last Year: Never true  Transportation Needs: No Transportation Needs (08/12/2022)   PRAPARE - Administrator, Civil Service (Medical): No    Lack of Transportation (Non-Medical): No  Physical Activity: Not on file  Stress: Not on file  Social Connections: Not on file  Intimate Partner Violence: Not on file    Tobacco Use: Low Risk  (08/12/2022)   Patient History    Smoking Tobacco Use: Never    Smokeless Tobacco Use: Never    Passive Exposure: Not on file   Social History   Substance and Sexual Activity  Alcohol  Use Yes   Alcohol /week: 2.0 - 3.0 standard drinks of alcohol    Types: 2 - 3 Cans of beer per week   Comment: rare use of whiskey- q 6 months     Family History  Problem Relation Age of Onset   Breast cancer Mother    Prostate cancer Father    Diabetes Sister    Breast cancer Sister    Diabetes Sister    Colon cancer Sister     ROS  Objective:  Physical Exam: Well nourished and well developed.  General: Alert and oriented x3, cooperative and pleasant, no acute distress.  Head: normocephalic, atraumatic, neck  supple.  Eyes: EOMI. Abdomen: non-tender to palpation and soft, normoactive bowel sounds. Musculoskeletal: - Left knee: No  effusion. Range of motion 0-125 degrees with crepitus on range of motion. Very tender medially. No lateral tenderness or instability noted. Calves soft and nontender. Motor function intact in LE. Strength 5/5 LE bilaterally. Neuro: Distal pulses 2+. Sensation to light touch intact in LE.  Vital signs in last 24 hours: BP: ()/()  Arterial Line BP: ()/()   Imaging Review Plain radiographs demonstrate severe degenerative joint disease of the left knee. The overall alignment is neutral. The bone quality appears to be adequate for age and reported activity level.  Assessment/Plan:  End stage arthritis, left knee   The patient history, physical examination, clinical judgment of the provider and imaging studies are consistent with end stage degenerative joint disease of the left knee and total knee arthroplasty is deemed medically necessary. The treatment options including medical management, injection therapy arthroscopy and arthroplasty were discussed at length. The risks and benefits of total knee arthroplasty were presented and reviewed. The risks due to aseptic loosening, infection, stiffness, patella tracking problems, thromboembolic complications and other imponderables were discussed. The patient acknowledged the explanation, agreed to proceed with the plan and consent was signed. Patient is being admitted for inpatient treatment for surgery, pain control, PT, OT, prophylactic antibiotics, VTE prophylaxis, progressive ambulation and ADLs and discharge planning. The patient is planning to be discharged home.  Patient's anticipated LOS is less than 2 midnights, meeting these requirements: - Lives within 1 hour of care - Has a competent adult at home to recover with post-op - NO history of  - Chronic pain requiring opiods  - Diabetes  - Coronary Artery Disease  - Heart  failure  - Heart attack  - Stroke  - DVT/VTE  - Cardiac arrhythmia  - Respiratory Failure/COPD  - Renal failure  - Anemia  - Advanced Liver disease  Therapy Plans: HEP Disposition: Home with Wife Planned DVT Prophylaxis: Aspirin  81 mg BID DME Needed: None PCP: Dorn Sauers, MD (clearance received) TXA: IV Allergies: latex (hives), sulfa drugs (swelling), cefuroxime (arthralgia), duloxetine, levofloxacin, shellfish (swelling) Anesthesia Concerns: None BMI: 25.5 Last HgbA1c: not diabetic  [facility]: [facility] (deliver to room)  Other: -Hydrocodone 10 mg TID - discussed oxycodone  and tramadol  post-op. -Takes 4 mg tizanidine  at night, does not take during the day due to sedation. Discussed methocarbamol  during day  - Patient was instructed on what medications to stop prior to surgery. - Follow-up visit in 2 weeks with Dr. Melodi - Begin physical therapy following surgery - Pre-operative lab work as pre-surgical testing - Prescriptions will be provided in hospital at time of discharge  R. Zelda Kobs, PA-C Orthopedic Surgery [facility] [place]

## 2023-10-25 ENCOUNTER — Encounter (HOSPITAL_COMMUNITY)
Admission: RE | Admit: 2023-10-25 | Discharge: 2023-10-25 | Disposition: A | Discharge status: Home / Self Care | Source: Ambulatory Visit | Attending: Orthopedic Surgery | Admitting: Orthopedic Surgery

## 2023-10-25 ENCOUNTER — Other Ambulatory Visit: Payer: Self-pay

## 2023-10-25 ENCOUNTER — Encounter (HOSPITAL_COMMUNITY): Payer: Self-pay

## 2023-10-25 VITALS — BP 137/65 | HR 64 | Temp 97.7°F | Resp 14 | Ht 69.0 in | Wt 177.0 lb

## 2023-10-25 DIAGNOSIS — E039 Hypothyroidism, unspecified: Secondary | ICD-10-CM | POA: Diagnosis not present

## 2023-10-25 DIAGNOSIS — M1712 Unilateral primary osteoarthritis, left knee: Secondary | ICD-10-CM | POA: Insufficient documentation

## 2023-10-25 DIAGNOSIS — J449 Chronic obstructive pulmonary disease, unspecified: Secondary | ICD-10-CM | POA: Diagnosis not present

## 2023-10-25 DIAGNOSIS — I10 Essential (primary) hypertension: Secondary | ICD-10-CM | POA: Insufficient documentation

## 2023-10-25 DIAGNOSIS — Z01818 Encounter for other preprocedural examination: Secondary | ICD-10-CM | POA: Insufficient documentation

## 2023-10-25 DIAGNOSIS — Q791 Other congenital malformations of diaphragm: Secondary | ICD-10-CM | POA: Insufficient documentation

## 2023-10-25 DIAGNOSIS — R001 Bradycardia, unspecified: Secondary | ICD-10-CM | POA: Diagnosis not present

## 2023-10-25 DIAGNOSIS — Z981 Arthrodesis status: Secondary | ICD-10-CM | POA: Diagnosis not present

## 2023-10-25 DIAGNOSIS — G4733 Obstructive sleep apnea (adult) (pediatric): Secondary | ICD-10-CM | POA: Diagnosis not present

## 2023-10-25 DIAGNOSIS — G894 Chronic pain syndrome: Secondary | ICD-10-CM | POA: Insufficient documentation

## 2023-10-25 LAB — BASIC METABOLIC PANEL WITH GFR
Anion gap: 7 (ref 5–15)
BUN: 24 mg/dL — ABNORMAL HIGH (ref 8–23)
CO2: 30 mmol/L (ref 22–32)
Calcium: 9.2 mg/dL (ref 8.9–10.3)
Chloride: 102 mmol/L (ref 98–111)
Creatinine, Ser: 1.1 mg/dL (ref 0.61–1.24)
GFR, Estimated: >60 mL/min (ref >60)
Glucose, Bld: 102 mg/dL — ABNORMAL HIGH (ref 70–99)
Potassium: 4.5 mmol/L (ref 3.5–5.1)
Sodium: 139 mmol/L (ref 135–145)

## 2023-10-25 LAB — SURGICAL PCR SCREEN
MRSA, PCR: NEGATIVE
Staphylococcus aureus: NEGATIVE

## 2023-10-25 LAB — CBC
HCT: 40.5 % (ref 39.0–52.0)
Hemoglobin: 12.9 g/dL — ABNORMAL LOW (ref 13.0–17.0)
MCH: 34.7 pg — ABNORMAL HIGH (ref 26.0–34.0)
MCHC: 31.9 g/dL (ref 30.0–36.0)
MCV: 108.9 fL — ABNORMAL HIGH (ref 80.0–100.0)
Platelets: 257 10*3/uL (ref 150–400)
RBC: 3.72 MIL/uL — ABNORMAL LOW (ref 4.22–5.81)
RDW: 13.1 % (ref 11.5–15.5)
WBC: 6.3 10*3/uL (ref 4.0–10.5)
nRBC: 0 % (ref 0.0–0.2)

## 2023-10-26 NOTE — Progress Notes (Signed)
 Case: [id number] Date/Time: 11/07/23 1015   Procedure: ARTHROPLASTY, KNEE, TOTAL (Left: Knee)   Anesthesia type: Choice   Pre-op diagnosis: Left knee osteoarthritis   Location: [facility] ROOM 10 / [facility]   Surgeons: Melodi Lerner, MD       DISCUSSION: Jason Friedman is an 83 yo male who presents to PAT prior to surgery above. PMH of former smoking?, HTN, COPD (mild), chronically elevated right hemidiaphragm, OSA (intolerant to CPAP, had weight loss), hypothyroidism, multiple cervical fusions s/p C4-6 ACDF (2019), chronic pain syndrome with narcotic dependence.   Prior anesthesia complications include PONV, history of difficult intubation due to reduced neck mobility from multiple C-spine surgeries.  Glidescope used in last airway note on 12/11/2019  Patient had eval by Cardiology in 2018 due to abnormal EKG (possibly due to chronically elevated right hemidiaphragm with rotated heart). At that time he reported good exercise tolerance without CV symptoms. Echo was obtained which was grossly normal. PRN f/u recommended  Patient follows with PCP and was last seen on 06/17/2023 for routine follow-up.  All issues stable.  Medical clearance signed patient is cleared from medical and cardiac perspective and moderate risk (scanned in media on 09/14/2023)  Patient followed with pulmonology remotely.  Last seen in 2019 at [facility].   VS: BP 137/65   Pulse 64   Temp 36.5 C (Oral)   Resp 14   Ht 5' 9 (1.753 m)   Wt 80.3 kg   SpO2 98%   BMI 26.14 kg/m   PROVIDERS: Charlott Dorn LABOR, MD Theotis Chancellor, MD is pulmonologist [facility]; see Care Everywhere). Last visit 06/22/17 with 8 month follow-up recommended.  Buck Saucer, MD is neurologist  LABS: Labs reviewed: Acceptable for surgery. (all labs ordered are listed, but only abnormal results are displayed)  Labs Reviewed  CBC - Abnormal; Notable for the following components:      Result Value   RBC 3.72 (*)    Hemoglobin 12.9 (*)     MCV 108.9 (*)    MCH 34.7 (*)    All other components within normal limits  BASIC METABOLIC PANEL WITH GFR - Abnormal; Notable for the following components:   Glucose, Bld 102 (*)    BUN 24 (*)    All other components within normal limits  SURGICAL PCR SCREEN     IMAGES: Chest x-ray 08/11/2022:  FINDINGS: Partially visualized bilateral shoulder arthroplasty. Stable cardiomediastinal silhouette with normal heart size. No pneumothorax. No pleural effusion. Chronic mild elevation of the right hemidiaphragm with mild curvilinear right lung base scarring versus atelectasis. No pulmonary edema. No acute consolidative airspace disease. No displaced fracture in the visualized chest.   IMPRESSION: No active cardiopulmonary disease. Chronic mild elevation of the right hemidiaphragm with mild curvilinear right lung base scarring versus atelectasis.    EKG 10/25/2023 Sinus bradycardia, rate 59 Inferior infarct , age undetermined Possible Anterior infarct , age undetermined Poor R-wave progression  CV:  Echo 05/19/3016:  Study Conclusions  - Left ventricle: The cavity size was normal. Wall thickness was   normal. Systolic function was normal. The estimated ejection   fraction was in the range of 55% to 60%. Wall motion was normal;   there were no regional wall motion abnormalities. Doppler   parameters are consistent with abnormal left ventricular   relaxation (grade 1 diastolic dysfunction). - Aortic valve: There was no stenosis. - Aorta: Borderline dilated aortic root. Aortic root dimension: 37   mm (ED). - Mitral valve: There was no significant  regurgitation. - Right ventricle: The cavity size was normal. Systolic function   was normal. - Pulmonary arteries: No complete TR doppler jet so unable to   estimate PA systolic pressure. - Inferior vena cava: The vessel was normal in size. The   respirophasic diameter changes were in the normal range (= 50%),   consistent with normal  central venous pressure.  Impressions:  - Normal LV size with EF 55-60%. Normal RV size and systolic   function. No significant valvular abnormalities. 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 , peripheri neuropathy   COPD (chronic obstructive pulmonary disease) (HCC)    mild copd per lov note dr theotis 04-16-11 on chart   Difficult intubation 1980s   difficult d/t anterior larynx, successful intubation using Glidescope 04/07/17   Fracture of cervical vertebrae, multiple (HCC)  in high school   2 cervical vertebrae fx, 3 dislocated cervical vertebrae   Grade I diastolic dysfunction    noted on ECHO   Heart abnormality    heart anatomicall rotated, causes abnormal ekg   Hypertension    hx on, none current   Hypogonadism in male    Hypothyroidism    Impaired memory    x 2 yrs ago after neck injury   Insomnia    Paralysis, diaphragm    Left   PONV (postoperative nausea and vomiting)    likes scopolamine  patch, vertigo ,pt. reports that anesthesia has had problem finding my airway   Prostate irregularity    nonfuctional prostate, on testerone gel bid   Sigmoid diverticulitis    Sleep apnea    does not use due to weight loss, does not need now, not able to use CPAP, last sleep study- 2018    Past Surgical History:  Procedure Laterality Date   ANTERIOR CERVICAL DECOMP/DISCECTOMY FUSION N/A 03/15/2018   Procedure: CERVICAL FOUR-FIVE, CERVICAL FIVE-SIX ANTERIOR CERVICAL DECOMPRESSION/DISCECTOMY FUSION ALLOGRAFT AND PLATE;  Surgeon: Barbarann Oneil BROCKS, MD;  Location: [facility];  Service: Orthopedics;  Laterality: N/A;  CERVICAL FOUR-FIVE, CERVICAL FIVE-SIX ANTERIOR CERVICAL DECOMPRESSION/DISCECTOMY FUSION ALLOGRAFT AND PLATE   CARPAL  TUNNEL RELEASE Left    hand is now numb   piliodional cyst  yrs ago   done x 3   POSTERIOR CERVICAL FUSION/FORAMINOTOMY N/A 05/08/2018   Procedure: C4-C6 POSTERIOR CERVICAL FUSION, WIRING, ILIAC BONE MARROW ASPIRATE;  Surgeon: Barbarann Oneil BROCKS, MD;  Location: [facility];  Service: Orthopedics;  Laterality: N/A;   REVERSE SHOULDER ARTHROPLASTY Right 12/11/2019   Procedure: REVERSE SHOULDER ARTHROPLASTY;  Surgeon: Melita Drivers, MD;  [facility];  Service: Orthopedics;  Laterality: Right;    right shoulder arthroscopy  03/02/2011   TONSILLECTOMY     TOTAL HIP ARTHROPLASTY  08/23/2011   Procedure: TOTAL HIP ARTHROPLASTY;  Surgeon: Dempsey LULLA Moan, MD;  [facility];  Service: Orthopedics;  Laterality: Right;   TOTAL HIP ARTHROPLASTY Left 03/19/2019   Procedure: TOTAL HIP ARTHROPLASTY ANTERIOR APPROACH;  Surgeon: Moan Dempsey, MD;  [facility];  Service: Orthopedics;  Laterality: Left;    TOTAL KNEE ARTHROPLASTY Right 04/14/2020   Procedure: TOTAL KNEE ARTHROPLASTY;  Surgeon: Moan Dempsey, MD;  [facility];  Service: Orthopedics;  Laterality: Right;    TOTAL SHOULDER ARTHROPLASTY Left 04/07/2017   Procedure: LEFT TOTAL SHOULDER ARTHROPLASTY;  Surgeon: Melita Drivers, MD;  Location: [facility];  Service: Orthopedics;  Laterality: Left;    MEDICATIONS:  ANDROGEL  PUMP 20.25 MG/ACT (1.62%) GEL   bisacodyl  5 MG EC tablet   Brinzolamide -Brimonidine  1-0.2 % SUSP   carboxymethylcellulose (REFRESH PLUS) 0.5 % SOLN   cromolyn  (OPTICROM ) 4 % ophthalmic solution   donepezil  (ARICEPT ) 5 MG tablet   dorzolamide -timolol  (COSOPT ) 22.3-6.8 MG/ML ophthalmic solution   doxycycline (VIBRAMYCIN) 100 MG capsule   fluticasone  (FLONASE ) 50 MCG/ACT nasal spray   gabapentin  (NEURONTIN ) 400 MG capsule   gabapentin  (NEURONTIN ) 600 MG tablet   HYDROcodone-acetaminophen  (NORCO) 10-325 MG tablet   levothyroxine  (SYNTHROID ) 150 MCG tablet   levothyroxine  (SYNTHROID ) 175 MCG tablet    losartan  (COZAAR ) 50 MG tablet   memantine  (NAMENDA ) 5 MG tablet   Multiple Vitamins-Minerals (MULTIVITAMIN WITH MINERALS) tablet   tiZANidine  (ZANAFLEX ) 4 MG tablet   No current facility-administered medications for this encounter.   Burnard CHRISTELLA Odis DEVONNA [facility] Short Stay/Anesthesiology [facility] Phone [phone] 10/26/2023 3:28 PM

## 2023-10-26 NOTE — Anesthesia Preprocedure Evaluation (Addendum)
 Anesthesia Evaluation  Patient identified by MRN, date of birth, ID band Patient awake    Reviewed: Allergy & Precautions, NPO status , Patient's Chart, lab work & pertinent test results  History of Anesthesia Complications (+) PONV, DIFFICULT AIRWAY and history of anesthetic complications  Airway Mallampati: II  TM Distance: >3 FB Neck ROM: Limited    Dental  (+) Dental Advisory Given   Pulmonary neg shortness of breath, neg sleep apnea, COPD, neg recent URI   Pulmonary exam normal breath sounds clear to auscultation       Cardiovascular hypertension, Pt. on medications (-) angina (-) Past MI and (-) CHF Normal cardiovascular exam Rhythm:Regular Rate:Normal  ECHO 2018 - Left ventricle: The cavity size was normal. Wall thickness was normal. Systolic function was normal. The estimated ejection fraction was in the range of 55% to 60%. Wall motion was normal; there were no regional wall motion abnormalities. Doppler parameters are consistent with abnormal left ventricular relaxation (grade 1 diastolic dysfunction).  - Aortic valve: There was no stenosis.  - Aorta: Borderline dilated aortic root. Aortic root dimension: 37 mm (ED).  - Mitral valve: There was no significant regurgitation.  - Right ventricle: The cavity size was normal. Systolic function was normal.  - Pulmonary arteries: No complete TR doppler jet so unable to estimate PA systolic pressure.  - Inferior vena cava: The vessel was normal in size. The respirophasic diameter changes were in the normal range (= 50%), consistent with normal central venous pressure.   Impressions:   - Normal LV size with EF 55-60%. Normal RV size and systolic function. No significant valvular abnormalities.      Neuro/Psych  Neuromuscular disease    GI/Hepatic negative GI ROS, Neg liver ROS,neg GERD  ,,  Endo/Other  neg diabetesHypothyroidism    Renal/GU negative Renal ROS      Musculoskeletal  (+) Arthritis ,    Abdominal   Peds  Hematology  (+) Blood dyscrasia, anemia   Anesthesia Other Findings   Reproductive/Obstetrics                              Anesthesia Physical Anesthesia Plan  ASA: 3  Anesthesia Plan: Spinal   Post-op Pain Management: Ofirmev  IV (intra-op)*, Regional block* and Gabapentin  PO (pre-op)*   Induction: Intravenous  PONV Risk Score and Plan: 3 and Ondansetron , Treatment may vary due to age or medical condition, Propofol  infusion and TIVA  Airway Management Planned: Natural Airway  Additional Equipment: None  Intra-op Plan:   Post-operative Plan:   Informed Consent: I have reviewed the patients History and Physical, chart, labs and discussed the procedure including the risks, benefits and alternatives for the proposed anesthesia with the patient or authorized representative who has indicated his/her understanding and acceptance.     Dental advisory given  Plan Discussed with: CRNA  Anesthesia Plan Comments: (See PAT note from 7/1  Risks of anesthesia explained at length. This includes, but is not limited to, sore throat, damage to teeth, lips gums, tongue and vocal cords, nausea and vomiting, reactions to medications, stroke, heart attack, and death. All patient questions were answered and the patient wishes to proceed. Risks of peripheral nerve block and spinal explained at length. This includes, but is not limited to, bleeding, infection, reactions to the medications, seizures, damage to surrounding structures, damage to nerves, permanent weakness, numbness, tingling and pain. All patient questions were answered and patient wishes to proceed with nerve  block and spinal. )         Anesthesia Quick Evaluation

## 2023-11-07 ENCOUNTER — Ambulatory Visit (HOSPITAL_COMMUNITY): Payer: Self-pay | Admitting: Medical

## 2023-11-07 ENCOUNTER — Encounter (HOSPITAL_COMMUNITY)
Admission: RE | Disposition: A | Discharge status: Home / Self Care | Payer: Self-pay | Source: Home / Self Care | Attending: Orthopedic Surgery

## 2023-11-07 ENCOUNTER — Encounter (HOSPITAL_COMMUNITY): Payer: Self-pay | Admitting: Orthopedic Surgery

## 2023-11-07 ENCOUNTER — Observation Stay (HOSPITAL_COMMUNITY)
Admission: RE | Admit: 2023-11-07 | Discharge: 2023-11-08 | Disposition: A | Discharge status: Home / Self Care | Attending: Orthopedic Surgery | Admitting: Orthopedic Surgery

## 2023-11-07 ENCOUNTER — Other Ambulatory Visit: Payer: Self-pay

## 2023-11-07 ENCOUNTER — Ambulatory Visit (HOSPITAL_BASED_OUTPATIENT_CLINIC_OR_DEPARTMENT_OTHER): Admitting: Certified Registered Nurse Anesthetist

## 2023-11-07 DIAGNOSIS — Z79899 Other long term (current) drug therapy: Secondary | ICD-10-CM | POA: Insufficient documentation

## 2023-11-07 DIAGNOSIS — M1712 Unilateral primary osteoarthritis, left knee: Principal | ICD-10-CM | POA: Insufficient documentation

## 2023-11-07 DIAGNOSIS — E039 Hypothyroidism, unspecified: Secondary | ICD-10-CM

## 2023-11-07 DIAGNOSIS — M179 Osteoarthritis of knee, unspecified: Principal | ICD-10-CM | POA: Diagnosis present

## 2023-11-07 DIAGNOSIS — Z96612 Presence of left artificial shoulder joint: Secondary | ICD-10-CM | POA: Insufficient documentation

## 2023-11-07 DIAGNOSIS — G8918 Other acute postprocedural pain: Secondary | ICD-10-CM | POA: Diagnosis not present

## 2023-11-07 DIAGNOSIS — I10 Essential (primary) hypertension: Secondary | ICD-10-CM | POA: Insufficient documentation

## 2023-11-07 DIAGNOSIS — Z96611 Presence of right artificial shoulder joint: Secondary | ICD-10-CM | POA: Insufficient documentation

## 2023-11-07 DIAGNOSIS — Z96643 Presence of artificial hip joint, bilateral: Secondary | ICD-10-CM | POA: Diagnosis not present

## 2023-11-07 DIAGNOSIS — J449 Chronic obstructive pulmonary disease, unspecified: Secondary | ICD-10-CM | POA: Diagnosis not present

## 2023-11-07 DIAGNOSIS — Z91040 Latex allergy status: Secondary | ICD-10-CM | POA: Diagnosis not present

## 2023-11-07 DIAGNOSIS — Z96651 Presence of right artificial knee joint: Secondary | ICD-10-CM | POA: Insufficient documentation

## 2023-11-07 HISTORY — PX: TOTAL KNEE ARTHROPLASTY: SHX125

## 2023-11-07 SURGERY — ARTHROPLASTY, KNEE, TOTAL
Anesthesia: Spinal | Site: Knee | Laterality: Left

## 2023-11-07 MED ORDER — SODIUM CHLORIDE (PF) 0.9 % IJ SOLN
INTRAMUSCULAR | Status: AC
  Filled 2023-11-07: qty 10

## 2023-11-07 MED ORDER — TRANEXAMIC ACID-NACL 1000-0.7 MG/100ML-% IV SOLN
1000.0000 mg | INTRAVENOUS | Status: AC
  Administered 2023-11-07: 1000 mg via INTRAVENOUS
  Filled 2023-11-07: qty 100

## 2023-11-07 MED ORDER — FENTANYL CITRATE (PF) 100 MCG/2ML IJ SOLN
INTRAMUSCULAR | Status: DC | PRN
  Administered 2023-11-07 (×2): 25 ug via INTRAVENOUS
  Administered 2023-11-07: 50 ug via INTRAVENOUS

## 2023-11-07 MED ORDER — CHLORHEXIDINE GLUCONATE 0.12 % MT SOLN
15.0000 mL | Freq: Once | OROMUCOSAL | Status: AC
  Administered 2023-11-07: 15 mL via OROMUCOSAL

## 2023-11-07 MED ORDER — ACETAMINOPHEN 500 MG PO TABS
1000.0000 mg | ORAL_TABLET | Freq: Four times a day (QID) | ORAL | Status: AC
  Administered 2023-11-07 – 2023-11-08 (×4): 1000 mg via ORAL
  Filled 2023-11-07 (×5): qty 2

## 2023-11-07 MED ORDER — DEXAMETHASONE SODIUM PHOSPHATE 10 MG/ML IJ SOLN
10.0000 mg | Freq: Once | INTRAMUSCULAR | Status: DC
  Filled 2023-11-07: qty 1

## 2023-11-07 MED ORDER — CEFAZOLIN SODIUM-DEXTROSE 2-4 GM/100ML-% IV SOLN
2.0000 g | INTRAVENOUS | Status: AC
  Administered 2023-11-07: 2 g via INTRAVENOUS
  Filled 2023-11-07: qty 100

## 2023-11-07 MED ORDER — STERILE WATER FOR IRRIGATION IR SOLN
Status: DC | PRN
  Administered 2023-11-07: 2000 mL

## 2023-11-07 MED ORDER — EPHEDRINE SULFATE (PRESSORS) 50 MG/ML IJ SOLN
INTRAMUSCULAR | Status: DC | PRN
  Administered 2023-11-07: 10 mg via INTRAVENOUS

## 2023-11-07 MED ORDER — ASPIRIN 81 MG PO CHEW
81.0000 mg | CHEWABLE_TABLET | Freq: Two times a day (BID) | ORAL | Status: DC
  Administered 2023-11-08: 81 mg via ORAL
  Filled 2023-11-07: qty 1

## 2023-11-07 MED ORDER — SODIUM CHLORIDE (PF) 0.9 % IJ SOLN
INTRAMUSCULAR | Status: DC | PRN
  Administered 2023-11-07: 80 mL

## 2023-11-07 MED ORDER — ROPIVACAINE HCL 5 MG/ML IJ SOLN
INTRAMUSCULAR | Status: DC | PRN
  Administered 2023-11-07: 30 mL via PERINEURAL

## 2023-11-07 MED ORDER — POVIDONE-IODINE 10 % EX SWAB: 2.0000 | Freq: Once | CUTANEOUS | Status: DC

## 2023-11-07 MED ORDER — DIPHENHYDRAMINE HCL 12.5 MG/5ML PO ELIX: 12.5000 mg | ORAL_SOLUTION | ORAL | Status: DC | PRN

## 2023-11-07 MED ORDER — DOCUSATE SODIUM 100 MG PO CAPS
100.0000 mg | ORAL_CAPSULE | Freq: Two times a day (BID) | ORAL | Status: DC
  Administered 2023-11-07 – 2023-11-08 (×2): 100 mg via ORAL
  Filled 2023-11-07 (×2): qty 1

## 2023-11-07 MED ORDER — LACTATED RINGERS IV SOLN: INTRAVENOUS | Status: DC

## 2023-11-07 MED ORDER — BUPIVACAINE LIPOSOME 1.3 % IJ SUSP
INTRAMUSCULAR | Status: AC
  Filled 2023-11-07: qty 20

## 2023-11-07 MED ORDER — DEXAMETHASONE SODIUM PHOSPHATE 10 MG/ML IJ SOLN
INTRAMUSCULAR | Status: DC | PRN
  Administered 2023-11-07: 10 mg via INTRAVENOUS

## 2023-11-07 MED ORDER — MIDAZOLAM HCL 2 MG/2ML IJ SOLN: 1.0000 mg | INTRAMUSCULAR | Status: DC

## 2023-11-07 MED ORDER — LOSARTAN POTASSIUM 50 MG PO TABS
50.0000 mg | ORAL_TABLET | Freq: Every evening | ORAL | Status: DC
Start: 2023-11-08 — End: 2023-11-08

## 2023-11-07 MED ORDER — METHOCARBAMOL 500 MG PO TABS
500.0000 mg | ORAL_TABLET | Freq: Four times a day (QID) | ORAL | Status: DC | PRN
Start: 2023-11-07 — End: 2023-11-08
  Administered 2023-11-07: 500 mg via ORAL
  Filled 2023-11-07: qty 1

## 2023-11-07 MED ORDER — ACETAMINOPHEN 10 MG/ML IV SOLN
1000.0000 mg | Freq: Four times a day (QID) | INTRAVENOUS | Status: DC
  Administered 2023-11-07: 1000 mg via INTRAVENOUS
  Filled 2023-11-07: qty 100

## 2023-11-07 MED ORDER — METHOCARBAMOL 1000 MG/10ML IJ SOLN: 500.0000 mg | Freq: Four times a day (QID) | INTRAMUSCULAR | Status: DC | PRN

## 2023-11-07 MED ORDER — PHENOL 1.4 % MT LIQD: 1.0000 | OROMUCOSAL | Status: DC | PRN

## 2023-11-07 MED ORDER — SODIUM CHLORIDE (PF) 0.9 % IJ SOLN
INTRAMUSCULAR | Status: AC
Start: 2023-11-07 — End: 2023-11-07
  Filled 2023-11-07: qty 50

## 2023-11-07 MED ORDER — DEXAMETHASONE SODIUM PHOSPHATE 10 MG/ML IJ SOLN
8.0000 mg | Freq: Once | INTRAMUSCULAR | Status: DC
Start: 2023-11-07 — End: 2023-11-07

## 2023-11-07 MED ORDER — LEVOTHYROXINE SODIUM 75 MCG PO TABS
150.0000 ug | ORAL_TABLET | ORAL | Status: DC
  Administered 2023-11-08: 150 ug via ORAL
  Filled 2023-11-07 (×2): qty 2

## 2023-11-07 MED ORDER — CEFAZOLIN SODIUM-DEXTROSE 2-4 GM/100ML-% IV SOLN
2.0000 g | Freq: Four times a day (QID) | INTRAVENOUS | Status: AC
  Administered 2023-11-07 (×2): 2 g via INTRAVENOUS
  Filled 2023-11-07 (×2): qty 100

## 2023-11-07 MED ORDER — FENTANYL CITRATE (PF) 100 MCG/2ML IJ SOLN
INTRAMUSCULAR | Status: AC
  Filled 2023-11-07: qty 2

## 2023-11-07 MED ORDER — PROPOFOL 10 MG/ML IV BOLUS
INTRAVENOUS | Status: DC | PRN
  Administered 2023-11-07: 20 mg via INTRAVENOUS
  Administered 2023-11-07: 80 mg via INTRAVENOUS

## 2023-11-07 MED ORDER — HYDROMORPHONE HCL 1 MG/ML IJ SOLN
INTRAMUSCULAR | Status: AC
  Filled 2023-11-07: qty 2

## 2023-11-07 MED ORDER — CLONIDINE HCL (ANALGESIA) 100 MCG/ML EP SOLN
EPIDURAL | Status: DC | PRN
Start: 2023-11-07 — End: 2023-11-07
  Administered 2023-11-07: 80 ug

## 2023-11-07 MED ORDER — 0.9 % SODIUM CHLORIDE (POUR BTL) OPTIME
TOPICAL | Status: DC | PRN
  Administered 2023-11-07: 1000 mL

## 2023-11-07 MED ORDER — HYDROMORPHONE HCL 1 MG/ML IJ SOLN
0.2500 mg | INTRAMUSCULAR | Status: DC | PRN
  Administered 2023-11-07 (×4): 0.5 mg via INTRAVENOUS

## 2023-11-07 MED ORDER — LEVOTHYROXINE SODIUM 50 MCG PO TABS: 175.0000 ug | ORAL_TABLET | ORAL | Status: DC

## 2023-11-07 MED ORDER — FENTANYL CITRATE PF 50 MCG/ML IJ SOSY
50.0000 ug | PREFILLED_SYRINGE | INTRAMUSCULAR | Status: DC
  Filled 2023-11-07: qty 2

## 2023-11-07 MED ORDER — FLEET ENEMA RE ENEM: 1.0000 | ENEMA | Freq: Once | RECTAL | Status: DC | PRN

## 2023-11-07 MED ORDER — DROPERIDOL 2.5 MG/ML IJ SOLN: 0.6250 mg | Freq: Once | INTRAMUSCULAR | Status: DC | PRN

## 2023-11-07 MED ORDER — ORAL CARE MOUTH RINSE: 15.0000 mL | Freq: Once | OROMUCOSAL | Status: AC

## 2023-11-07 MED ORDER — FLUTICASONE PROPIONATE 50 MCG/ACT NA SUSP: 2.0000 | Freq: Every evening | NASAL | Status: DC | PRN

## 2023-11-07 MED ORDER — METOCLOPRAMIDE HCL 5 MG PO TABS: 5.0000 mg | ORAL_TABLET | Freq: Three times a day (TID) | ORAL | Status: DC | PRN

## 2023-11-07 MED ORDER — OXYCODONE HCL 5 MG PO TABS
5.0000 mg | ORAL_TABLET | ORAL | Status: DC | PRN
  Administered 2023-11-07 – 2023-11-08 (×3): 5 mg via ORAL
  Filled 2023-11-07 (×4): qty 1

## 2023-11-07 MED ORDER — ONDANSETRON HCL 4 MG/2ML IJ SOLN: 4.0000 mg | Freq: Four times a day (QID) | INTRAMUSCULAR | Status: DC | PRN

## 2023-11-07 MED ORDER — MORPHINE SULFATE (PF) 2 MG/ML IV SOLN
1.0000 mg | INTRAVENOUS | Status: DC | PRN
  Administered 2023-11-07: 2 mg via INTRAVENOUS
  Filled 2023-11-07 (×2): qty 1

## 2023-11-07 MED ORDER — TIZANIDINE HCL 4 MG PO TABS
4.0000 mg | ORAL_TABLET | Freq: Every day | ORAL | Status: DC
  Administered 2023-11-07: 4 mg via ORAL
  Filled 2023-11-07: qty 1

## 2023-11-07 MED ORDER — MENTHOL 3 MG MT LOZG
1.0000 | LOZENGE | OROMUCOSAL | Status: DC | PRN
Start: 2023-11-07 — End: 2023-11-08

## 2023-11-07 MED ORDER — ONDANSETRON HCL 4 MG/2ML IJ SOLN
INTRAMUSCULAR | Status: DC | PRN
  Administered 2023-11-07: 4 mg via INTRAVENOUS

## 2023-11-07 MED ORDER — DONEPEZIL HCL 10 MG PO TABS
5.0000 mg | ORAL_TABLET | Freq: Every day | ORAL | Status: DC
  Administered 2023-11-07: 5 mg via ORAL
  Filled 2023-11-07: qty 1

## 2023-11-07 MED ORDER — POLYETHYLENE GLYCOL 3350 17 G PO PACK: 17.0000 g | PACK | Freq: Every day | ORAL | Status: DC | PRN

## 2023-11-07 MED ORDER — SODIUM CHLORIDE 0.9 % IV SOLN: INTRAVENOUS | Status: DC

## 2023-11-07 MED ORDER — DEXAMETHASONE SODIUM PHOSPHATE 4 MG/ML IJ SOLN
INTRAMUSCULAR | Status: DC | PRN
  Administered 2023-11-07: 5 mg via PERINEURAL

## 2023-11-07 MED ORDER — BISACODYL 10 MG RE SUPP: 10.0000 mg | Freq: Every day | RECTAL | Status: DC | PRN

## 2023-11-07 MED ORDER — ACETAMINOPHEN 325 MG PO TABS: 325.0000 mg | ORAL_TABLET | Freq: Four times a day (QID) | ORAL | Status: DC | PRN

## 2023-11-07 MED ORDER — ONDANSETRON HCL 4 MG PO TABS: 4.0000 mg | ORAL_TABLET | Freq: Four times a day (QID) | ORAL | Status: DC | PRN

## 2023-11-07 MED ORDER — SODIUM CHLORIDE 0.9 % IR SOLN
Status: DC | PRN
  Administered 2023-11-07: 1000 mL

## 2023-11-07 MED ORDER — BISACODYL 5 MG PO TBEC
5.0000 mg | DELAYED_RELEASE_TABLET | Freq: Every day | ORAL | Status: DC
  Administered 2023-11-07: 5 mg via ORAL
  Filled 2023-11-07: qty 1

## 2023-11-07 MED ORDER — OXYCODONE HCL 5 MG PO TABS
10.0000 mg | ORAL_TABLET | ORAL | Status: DC | PRN
  Administered 2023-11-07: 10 mg via ORAL

## 2023-11-07 MED ORDER — TRAMADOL HCL 50 MG PO TABS
50.0000 mg | ORAL_TABLET | Freq: Four times a day (QID) | ORAL | Status: DC | PRN
  Administered 2023-11-08: 50 mg via ORAL
  Filled 2023-11-07: qty 1

## 2023-11-07 MED ORDER — PROPOFOL 500 MG/50ML IV EMUL
INTRAVENOUS | Status: DC | PRN
  Administered 2023-11-07: 80 ug/kg/min via INTRAVENOUS

## 2023-11-07 MED ORDER — OXYCODONE HCL 5 MG PO TABS
5.0000 mg | ORAL_TABLET | ORAL | Status: DC | PRN
  Filled 2023-11-07: qty 2

## 2023-11-07 MED ORDER — FENTANYL CITRATE PF 50 MCG/ML IJ SOSY
50.0000 ug | PREFILLED_SYRINGE | INTRAMUSCULAR | Status: DC
  Administered 2023-11-07: 50 ug via INTRAVENOUS

## 2023-11-07 MED ORDER — GABAPENTIN 300 MG PO CAPS
600.0000 mg | ORAL_CAPSULE | Freq: Every day | ORAL | Status: DC
  Administered 2023-11-08: 600 mg via ORAL
  Filled 2023-11-07: qty 2

## 2023-11-07 MED ORDER — BUPIVACAINE LIPOSOME 1.3 % IJ SUSP: 20.0000 mL | Freq: Once | INTRAMUSCULAR | Status: DC

## 2023-11-07 MED ORDER — GABAPENTIN 400 MG PO CAPS
400.0000 mg | ORAL_CAPSULE | Freq: Two times a day (BID) | ORAL | Status: DC
  Administered 2023-11-07 – 2023-11-08 (×2): 400 mg via ORAL
  Filled 2023-11-07 (×2): qty 1

## 2023-11-07 MED ORDER — MEMANTINE HCL 10 MG PO TABS
5.0000 mg | ORAL_TABLET | Freq: Two times a day (BID) | ORAL | Status: DC
  Administered 2023-11-08: 5 mg via ORAL
  Filled 2023-11-07: qty 1

## 2023-11-07 MED ORDER — METOCLOPRAMIDE HCL 5 MG/ML IJ SOLN: 5.0000 mg | Freq: Three times a day (TID) | INTRAMUSCULAR | Status: DC | PRN

## 2023-11-07 SURGICAL SUPPLY — 43 items
ATTUNE MED DOME PAT 41 KNEE (Knees) IMPLANT
ATTUNE PS FEM LT SZ 8 CEM KNEE (Femur) IMPLANT
ATTUNE PSRP INSR SZ8 7 KNEE (Insert) IMPLANT
BAG COUNTER SPONGE SURGICOUNT (BAG) IMPLANT
BAG ZIPLOCK 12X15 (MISCELLANEOUS) ×1 IMPLANT
BASE TIBIAL ROT PLAT SZ 8 KNEE (Knees) IMPLANT
BLADE SAG 18X100X1.27 (BLADE) ×1 IMPLANT
BLADE SAW SGTL 11.0X1.19X90.0M (BLADE) ×1 IMPLANT
BNDG ELASTIC 6INX 5YD STR LF (GAUZE/BANDAGES/DRESSINGS) ×1 IMPLANT
BOWL SMART MIX CTS (DISPOSABLE) ×1 IMPLANT
CEMENT HV SMART SET (Cement) ×2 IMPLANT
COVER SURGICAL LIGHT HANDLE (MISCELLANEOUS) ×1 IMPLANT
CUFF TRNQT CYL 34X4.125X (TOURNIQUET CUFF) ×1 IMPLANT
DERMABOND ADVANCED .7 DNX12 (GAUZE/BANDAGES/DRESSINGS) ×1 IMPLANT
DRAPE U-SHAPE 47X51 STRL (DRAPES) ×1 IMPLANT
DRSG AQUACEL AG ADV 3.5X10 (GAUZE/BANDAGES/DRESSINGS) ×1 IMPLANT
DURAPREP 26ML APPLICATOR (WOUND CARE) ×1 IMPLANT
ELECT REM PT RETURN 15FT ADLT (MISCELLANEOUS) ×1 IMPLANT
GLOVE BIO SURGEON STRL SZ 6.5 (GLOVE) IMPLANT
GLOVE BIO SURGEON STRL SZ7 (GLOVE) IMPLANT
GLOVE BIO SURGEON STRL SZ8 (GLOVE) ×1 IMPLANT
GLOVE BIOGEL PI IND STRL 7.0 (GLOVE) ×1 IMPLANT
GLOVE BIOGEL PI IND STRL 8 (GLOVE) ×1 IMPLANT
GOWN STRL REUS W/ TWL LRG LVL3 (GOWN DISPOSABLE) ×1 IMPLANT
HOLDER FOLEY CATH W/STRAP (MISCELLANEOUS) ×1 IMPLANT
IMMOBILIZER KNEE 20 THIGH 36 (SOFTGOODS) ×1 IMPLANT
KIT TURNOVER KIT A (KITS) ×1 IMPLANT
MANIFOLD NEPTUNE II (INSTRUMENTS) ×1 IMPLANT
NS IRRIG 1000ML POUR BTL (IV SOLUTION) ×1 IMPLANT
PACK TOTAL KNEE CUSTOM (KITS) ×1 IMPLANT
PADDING CAST COTTON 6X4 STRL (CAST SUPPLIES) ×2 IMPLANT
PENCIL SMOKE EVACUATOR (MISCELLANEOUS) ×1 IMPLANT
PIN STEINMAN FIXATION KNEE (PIN) IMPLANT
PROTECTOR NERVE ULNAR (MISCELLANEOUS) ×1 IMPLANT
SET HNDPC FAN SPRY TIP SCT (DISPOSABLE) ×1 IMPLANT
SUT MNCRL AB 4-0 PS2 18 (SUTURE) ×1 IMPLANT
SUT VIC AB 2-0 CT1 TAPERPNT 27 (SUTURE) ×3 IMPLANT
SUTURE STRATFX 0 PDS 27 VIOLET (SUTURE) ×1 IMPLANT
TOWEL GREEN STERILE FF (TOWEL DISPOSABLE) ×1 IMPLANT
TRAY FOLEY MTR SLVR 16FR STAT (SET/KITS/TRAYS/PACK) ×1 IMPLANT
TUBE SUCTION HIGH CAP CLEAR NV (SUCTIONS) ×1 IMPLANT
WATER STERILE IRR 1000ML POUR (IV SOLUTION) ×2 IMPLANT
WRAP KNEE MAXI GEL POST OP (GAUZE/BANDAGES/DRESSINGS) ×1 IMPLANT

## 2023-11-07 NOTE — Anesthesia Postprocedure Evaluation (Signed)
 Anesthesia Post Note  Patient: Jason Friedman.  Procedure(s) Performed: ARTHROPLASTY, KNEE, TOTAL (Left: Knee)     Patient location during evaluation: PACU Anesthesia Type: Spinal Level of consciousness: awake and alert Pain management: pain level controlled Vital Signs Assessment: post-procedure vital signs reviewed and stable Respiratory status: spontaneous breathing Cardiovascular status: stable Anesthetic complications: no   No notable events documented.  Last Vitals:  Vitals:   11/07/23 1501 11/07/23 1754  BP: (!) 140/76 (!) 100/54  Pulse: 70 67  Resp: 16 16  Temp: 36.6 C 36.6 C  SpO2: 98% 98%    Last Pain:  Vitals:   11/07/23 1825  TempSrc:   PainSc: 4     LLE Motor Response: Purposeful movement (11/07/23 2025) LLE Sensation: Full sensation;Pain (11/07/23 2025) RLE Motor Response: Purposeful movement (11/07/23 2025) RLE Sensation: Full sensation (11/07/23 2025) L Sensory Level: S1-Sole of foot, small toes (11/07/23 2025) R Sensory Level: S1-Sole of foot, small toes (11/07/23 2025)  Norleen Pope

## 2023-11-07 NOTE — Anesthesia Procedure Notes (Signed)
 Procedure Name: LMA Insertion Date/Time: 11/07/2023 10:32 AM  Performed by: Cena Epps, CRNAPre-anesthesia Checklist: Patient identified, Emergency Drugs available, Suction available and Patient being monitored Patient Re-evaluated:Patient Re-evaluated prior to induction Oxygen Delivery Method: Circle System Utilized Preoxygenation: Pre-oxygenation with 100% oxygen Induction Type: IV induction Ventilation: Mask ventilation without difficulty LMA: LMA inserted LMA Size: 4.0 Number of attempts: 1 Airway Equipment and Method: Bite block Placement Confirmation: positive ETCO2 Tube secured with: Tape Dental Injury: Teeth and Oropharynx as per pre-operative assessment

## 2023-11-07 NOTE — Plan of Care (Signed)

## 2023-11-07 NOTE — Interval H&P Note (Signed)
 History and Physical Interval Note:  11/07/2023 8:34 AM  Jason Friedman.  has presented today for surgery, with the diagnosis of Left knee osteoarthritis.  The various methods of treatment have been discussed with the patient and family. After consideration of risks, benefits and other options for treatment, the patient has consented to  Procedure(s): ARTHROPLASTY, KNEE, TOTAL (Left) as a surgical intervention.  The patient's history has been reviewed, patient examined, no change in status, stable for surgery.  I have reviewed the patient's chart and labs.  Questions were answered to the patient's satisfaction.     [name]

## 2023-11-07 NOTE — Transfer of Care (Signed)
 Immediate Anesthesia Transfer of Care Note  Patient: Jason Friedman.  Procedure(s) Performed: ARTHROPLASTY, KNEE, TOTAL (Left: Knee)  Patient Location: PACU  Anesthesia Type:General  Level of Consciousness: awake, alert , oriented, and patient cooperative  Airway & Oxygen Therapy: Patient Spontanous Breathing and Patient connected to face mask oxygen  Post-op Assessment: Report given to RN and Post -op Vital signs reviewed and stable  Post vital signs: Reviewed and stable  Last Vitals:  Vitals Value Taken Time  BP 137/118 11/07/23 12:01  Temp    Pulse 76 11/07/23 12:03  Resp 18 11/07/23 12:03  SpO2 100 % 11/07/23 12:03  Vitals shown include unfiled device data.  Last Pain:  Vitals:   11/07/23 0900  TempSrc: Oral  PainSc:          Complications: No notable events documented.

## 2023-11-07 NOTE — Progress Notes (Signed)
 Orthopedic Tech Progress Note Patient Details:  Jason Friedman 1941/02/20 [id number]  Patient ID: Jason Friedman., male   DOB: 1940-10-04, 83 y.o.   MRN: [id number] Unable to apply CPM at this time due to patient rating pain 10/10, attempted to apply, but patient did not tolerate. Jason Friedman 11/07/2023, 12:27 PM

## 2023-11-07 NOTE — Op Note (Addendum)
 OPERATIVE REPORT-TOTAL KNEE ARTHROPLASTY   Pre-operative diagnosis- Osteoarthritis  Left knee(s)  Post-operative diagnosis- Osteoarthritis Left knee(s)  Procedure-  Left  Total Knee Arthroplasty  Surgeon- Dempsey GAILS. Bensyn Bornemann, MD  Assistant- Zelda Kobs, PA-C   Anesthesia-  Adductor canal block and general  EBL- 25 ml   Drains None  Tourniquet time-  Total Tourniquet Time Documented: Thigh (Left) - 37 minutes Total: Thigh (Left) - 37 minutes     Complications- None  Condition-PACU - hemodynamically stable.   Brief Clinical Note   Jason Friedman. is a 83 y.o. year old male with end stage OA of his left knee with progressively worsening pain and dysfunction. He has constant pain, with activity and at rest and significant functional deficits with difficulties even with ADLs. He has had extensive non-op management including analgesics, injections of cortisone and viscosupplements, and home exercise program, but remains in significant pain with significant dysfunction. Radiographs show bone on bone arthritis medial and patellofemoral. He presents now for left Total Knee Arthroplasty.     Procedure in detail---   The patient is brought into the operating room and positioned supine on the operating table. After successful administration of Adductor canal block and general, a tourniquet is placed high on the  Left thigh(s) and the lower extremity is prepped and draped in the usual sterile fashion. Time out is performed by the operating team and then the  Left lower extremity is wrapped in Esmarch, knee flexed and the tourniquet inflated to 300 mmHg.       A midline incision is made with a ten blade through the subcutaneous tissue to the level of the extensor mechanism. A fresh blade is used to make a medial parapatellar arthrotomy. Soft tissue over the proximal medial tibia is subperiosteally elevated to the joint line with a knife and into the semimembranosus bursa with a Cobb  elevator. Soft tissue over the proximal lateral tibia is elevated with attention being paid to avoiding the patellar tendon on the tibial tubercle. The patella is everted, knee flexed 90 degrees and the ACL and PCL are removed. Findings are bone on bone medial and patellofemoral         The drill is used to create a starting hole in the distal femur and the canal is thoroughly irrigated with sterile saline to remove the fatty contents. The 5 degree Left  valgus alignment guide is placed into the femoral canal and the distal femoral cutting block is pinned to remove 10 mm off the distal femur. Resection is made with an oscillating saw.      The tibia is subluxed forward and the menisci are removed. The extramedullary alignment guide is placed referencing proximally at the medial aspect of the tibial tubercle and distally along the second metatarsal axis and tibial crest. The block is pinned to remove 2mm off the more deficient medial  side. Resection is made with an oscillating saw. Size 8is the most appropriate size for the tibia and the proximal tibia is prepared with the modular drill and keel punch for that size.      The femoral sizing guide is placed and size 8 is most appropriate. Rotation is marked off the epicondylar axis and confirmed by creating a rectangular flexion gap at 90 degrees. The size 8 cutting block is pinned in this rotation and the anterior, posterior and chamfer cuts are made with the oscillating saw. The intercondylar block is then placed and that cut is made.  Trial size 8 tibial component, trial size 8 posterior stabilized femur and a 7  mm posterior stabilized rotating platform insert trial is placed. Full extension is achieved with excellent varus/valgus and anterior/posterior balance throughout full range of motion. The patella is everted and thickness measured to be 27  mm. Free hand resection is taken to 15 mm, a 41 template is placed, lug holes are drilled, trial patella is  placed, and it tracks normally. Osteophytes are removed off the posterior femur with the trial in place. All trials are removed and the cut bone surfaces prepared with pulsatile lavage. Cement is mixed and once ready for implantation, the size 8 tibial implant, size  8 posterior stabilized femoral component, and the size 41 patella are cemented in place and the patella is held with the clamp. The trial insert is placed and the knee held in full extension. The Exparel  (20 ml mixed with 60 ml saline) is injected into the extensor mechanism, posterior capsule, medial and lateral gutters and subcutaneous tissues.  All extruded cement is removed and once the cement is hard the permanent 7 mm posterior stabilized rotating platform insert is placed into the tibial tray.      The wound is copiously irrigated with saline solution and the extensor mechanism closed with # 0 Stratofix suture. The tourniquet is released for a total tourniquet time of 37  minutes. Flexion against gravity is 140 degrees and the patella tracks normally. Subcutaneous tissue is closed with 2.0 vicryl and subcuticular with running 4.0 Monocryl. The incision is cleaned and dried and steri-strips and a bulky sterile dressing are applied. The limb is placed into a knee immobilizer and the patient is awakened and transported to recovery in stable condition.      Please note that a surgical assistant was a medical necessity for this procedure in order to perform it in a safe and expeditious manner. Surgical assistant was necessary to retract the ligaments and vital neurovascular structures to prevent injury to them and also necessary for proper positioning of the limb to allow for anatomic placement of the prosthesis.   Dempsey ROCKFORD Alexie Samson, MD    11/07/2023, 11:35 AM

## 2023-11-07 NOTE — Anesthesia Procedure Notes (Signed)
 Anesthesia Regional Block: Adductor canal block   Pre-Anesthetic Checklist: , timeout performed,  Correct Patient, Correct Site, Correct Laterality,  Correct Procedure, Correct Position, site marked,  Risks and benefits discussed,  Surgical consent,  Pre-op evaluation,  At surgeon's request and post-op pain management  Laterality: Lower and Left  Prep: chloraprep       Needles:  Injection technique: Single-shot  Needle Type: Stimiplex     Needle Length: 9cm  Needle Gauge: 21     Additional Needles:   Procedures:,,,, ultrasound used (permanent image in chart),,    Narrative:  Start time: 11/07/2023 9:48 AM End time: 11/07/2023 10:08 AM Injection made incrementally with aspirations every 5 mL.  Performed by: Personally  Anesthesiologist: Darlyn Rush, MD  Additional Notes: BP cuff, EKG monitors applied. Sedation begun. Artery and nerve location verified with ultrasound. Anesthetic injected incrementally (5ml), slowly, and after negative aspirations under direct u/s guidance. Good fascial/perineural spread. Tolerated well.

## 2023-11-07 NOTE — Discharge Instructions (Signed)
 Jason Gross, MD Total Joint Specialist [facility] [address], [address] [place], [place] [address] [phone]  TOTAL KNEE REPLACEMENT POSTOPERATIVE DIRECTIONS    Knee Rehabilitation, Guidelines Following Surgery  Results after knee surgery are often greatly improved when you follow the exercise, range of motion and muscle strengthening exercises prescribed by your doctor. Safety measures are also important to protect the knee from further injury. If any of these exercises cause you to have increased pain or swelling in your knee joint, decrease the amount until you are comfortable again and slowly increase them. If you have problems or questions, call your caregiver or physical therapist for advice.   BLOOD CLOT PREVENTION Take 81 mg Aspirin two times a day for three weeks following surgery. Then take an 81 mg Aspirin once a day for three weeks. Then discontinue Aspirin. You may resume your vitamins/supplements upon discharge from the hospital. Do not take any NSAIDs (Advil, Aleve, Ibuprofen, Meloxicam, etc.) for 3 weeks, while taking 81mg  Aspirin twice a day.   HOME CARE INSTRUCTIONS  Remove items at home which could result in a fall. This includes throw rugs or furniture in walking pathways.  ICE to the affected knee as much as tolerated. Icing helps control swelling. If the swelling is well controlled you will be more comfortable and rehab easier. Continue to use ice on the knee for pain and swelling from surgery. You may notice swelling that will progress down to the foot and ankle. This is normal after surgery. Elevate the leg when you are not up walking on it.    Continue to use the breathing machine which will help keep your temperature down. It is common for your temperature to cycle up and down following surgery, especially at night when you are not up moving around and exerting yourself. The breathing machine keeps your lungs expanded and your temperature  down. Do not place pillow under the operative knee, focus on keeping the knee straight while resting  DIET You may resume your previous home diet once you are discharged from the hospital.  DRESSING / WOUND CARE / SHOWERING Keep your bulky bandage on for 2 days. On the third post-operative day you may remove the Ace bandage and gauze. There is a waterproof adhesive bandage on your skin which will stay in place until your first follow-up appointment. Once you remove this you will not need to place another bandage You may begin showering 3 days following surgery, but do not submerge the incision under water.  ACTIVITY For the first 5 days, the key is rest and control of pain and swelling Do your home exercises twice a day starting on post-operative day 3. On the days you go to physical therapy, just do the home exercises once that day. You should rest, ice and elevate the leg for 50 minutes out of every hour. Get up and walk/stretch for 10 minutes per hour. After 5 days you can increase your activity slowly as tolerated. Walk with your walker as instructed. Use the walker until you are comfortable transitioning to a cane. Walk with the cane in the opposite hand of the operative leg. You may discontinue the cane once you are comfortable and walking steadily. Avoid periods of inactivity such as sitting longer than an hour when not asleep. This helps prevent blood clots.  You may discontinue the knee immobilizer once you are able to perform a straight leg raise while lying down. You may resume a sexual relationship in  one month or when given the OK by your doctor.  You may return to work once you are cleared by your doctor.  Do not drive a car for 6 weeks or until released by your surgeon.  Do not drive while taking narcotics.  TED HOSE STOCKINGS Wear the elastic stockings on both legs for three weeks following surgery during the day. You may remove them at night for sleeping.  WEIGHT  BEARING Weight bearing as tolerated with assist device (walker, cane, etc) as directed, use it as long as suggested by your surgeon or therapist, typically at least 4-6 weeks.  POSTOPERATIVE CONSTIPATION PROTOCOL Constipation - defined medically as fewer than three stools per week and severe constipation as less than one stool per week.  One of the most common issues patients have following surgery is constipation.  Even if you have a regular bowel pattern at home, your normal regimen is likely to be disrupted due to multiple reasons following surgery.  Combination of anesthesia, postoperative narcotics, change in appetite and fluid intake all can affect your bowels.  In order to avoid complications following surgery, here are some recommendations in order to help you during your recovery period.  Colace (docusate) - Pick up an over-the-counter form of Colace or another stool softener and take twice a day as long as you are requiring postoperative pain medications.  Take with a full glass of water daily.  If you experience loose stools or diarrhea, hold the colace until you stool forms back up. If your symptoms do not get better within 1 week or if they get worse, check with your doctor. Dulcolax (bisacodyl) - Pick up over-the-counter and take as directed by the product packaging as needed to assist with the movement of your bowels.  Take with a full glass of water.  Use this product as needed if not relieved by Colace only.  MiraLax (polyethylene glycol) - Pick up over-the-counter to have on hand. MiraLax is a solution that will increase the amount of water in your bowels to assist with bowel movements.  Take as directed and can mix with a glass of water, juice, soda, coffee, or tea. Take if you go more than two days without a movement. Do not use MiraLax more than once per day. Call your doctor if you are still constipated or irregular after using this medication for 7 days in a row.  If you continue  to have problems with postoperative constipation, please contact the office for further assistance and recommendations.  If you experience "the worst abdominal pain ever" or develop nausea or vomiting, please contact the office immediatly for further recommendations for treatment.  ITCHING If you experience itching with your medications, try taking only a single pain pill, or even half a pain pill at a time.  You can also use Benadryl over the counter for itching or also to help with sleep.   MEDICATIONS See your medication summary on the "After Visit Summary" that the nursing staff will review with you prior to discharge.  You may have some home medications which will be placed on hold until you complete the course of blood thinner medication.  It is important for you to complete the blood thinner medication as prescribed by your surgeon.  Continue your approved medications as instructed at time of discharge.  PRECAUTIONS If you experience chest pain or shortness of breath - call 911 immediately for transfer to the hospital emergency department.  If you develop a fever greater that  101 F, purulent drainage from wound, increased redness or drainage from wound, foul odor from the wound/dressing, or calf pain - CONTACT YOUR SURGEON.                                                   FOLLOW-UP APPOINTMENTS Make sure you keep all of your appointments after your operation with your surgeon and caregivers. You should call the office at the above phone number and make an appointment for approximately two weeks after the date of your surgery or on the date instructed by your surgeon outlined in the "After Visit Summary".  RANGE OF MOTION AND STRENGTHENING EXERCISES  Rehabilitation of the knee is important following a knee injury or an operation. After just a few days of immobilization, the muscles of the thigh which control the knee become weakened and shrink (atrophy). Knee exercises are designed to build up  the tone and strength of the thigh muscles and to improve knee motion. Often times heat used for twenty to thirty minutes before working out will loosen up your tissues and help with improving the range of motion but do not use heat for the first two weeks following surgery. These exercises can be done on a training (exercise) mat, on the floor, on a table or on a bed. Use what ever works the best and is most comfortable for you Knee exercises include:  Leg Lifts - While your knee is still immobilized in a splint or cast, you can do straight leg raises. Lift the leg to 60 degrees, hold for 3 sec, and slowly lower the leg. Repeat 10-20 times 2-3 times daily. Perform this exercise against resistance later as your knee gets better.  Quad and Hamstring Sets - Tighten up the muscle on the front of the thigh (Quad) and hold for 5-10 sec. Repeat this 10-20 times hourly. Hamstring sets are done by pushing the foot backward against an object and holding for 5-10 sec. Repeat as with quad sets.  Leg Slides: Lying on your back, slowly slide your foot toward your buttocks, bending your knee up off the floor (only go as far as is comfortable). Then slowly slide your foot back down until your leg is flat on the floor again. Angel Wings: Lying on your back spread your legs to the side as far apart as you can without causing discomfort.  A rehabilitation program following serious knee injuries can speed recovery and prevent re-injury in the future due to weakened muscles. Contact your doctor or a physical therapist for more information on knee rehabilitation.   POST-OPERATIVE OPIOID TAPER INSTRUCTIONS: It is important to wean off of your opioid medication as soon as possible. If you do not need pain medication after your surgery it is ok to stop day one. Opioids include: Codeine, Hydrocodone(Norco, Vicodin), Oxycodone(Percocet, oxycontin) and hydromorphone amongst others.  Long term and even short term use of opiods can  cause: Increased pain response Dependence Constipation Depression Respiratory depression And more.  Withdrawal symptoms can include Flu like symptoms Nausea, vomiting And more Techniques to manage these symptoms Hydrate well Eat regular healthy meals Stay active Use relaxation techniques(deep breathing, meditating, yoga) Do Not substitute Alcohol to help with tapering If you have been on opioids for less than two weeks and do not have pain than it is ok to stop all together.  Plan to wean off of opioids This plan should start within one week post op of your joint replacement. Maintain the same interval or time between taking each dose and first decrease the dose.  Cut the total daily intake of opioids by one tablet each day Next start to increase the time between doses. The last dose that should be eliminated is the evening dose.   IF YOU ARE TRANSFERRED TO A SKILLED REHAB FACILITY If the patient is transferred to a skilled rehab facility following release from the hospital, a list of the current medications will be sent to the facility for the patient to continue.  When discharged from the skilled rehab facility, please have the facility set up the patient's Home Health Physical Therapy prior to being released. Also, the skilled facility will be responsible for providing the patient with their medications at time of release from the facility to include their pain medication, the muscle relaxants, and their blood thinner medication. If the patient is still at the rehab facility at time of the two week follow up appointment, the skilled rehab facility will also need to assist the patient in arranging follow up appointment in our office and any transportation needs.  MAKE SURE YOU:  Understand these instructions.  Get help right away if you are not doing well or get worse.   DENTAL ANTIBIOTICS:  In most cases prophylactic antibiotics for Dental procdeures after total joint surgery are  not necessary.  Exceptions are as follows:  1. History of prior total joint infection  2. Severely immunocompromised (Organ Transplant, cancer chemotherapy, Rheumatoid biologic meds such as Humera)  3. Poorly controlled diabetes (A1C &gt; 8.0, blood glucose over 200)  If you have one of these conditions, contact your surgeon for an antibiotic prescription, prior to your dental procedure.    Pick up stool softner and laxative for home use following surgery while on pain medications. Do not submerge incision under water. Please use good hand washing techniques while changing dressing each day. May shower starting three days after surgery. Please use a clean towel to pat the incision dry following showers. Continue to use ice for pain and swelling after surgery. Do not use any lotions or creams on the incision until instructed by your surgeon.

## 2023-11-07 NOTE — Plan of Care (Signed)

## 2023-11-07 NOTE — Evaluation (Signed)
 Physical Therapy Evaluation Patient Details Name: Jason Friedman. MRN: [id number] DOB: Feb 24, 1941 Today's Date: 11/07/2023  History of Present Illness  Patient is 83 yo male s/p L TKA on 11/07/23.  Pt with PMH including but not limited to OA, R TKA, HTN, COPD, multiple C-spine surgeries (fusions C4-6), Bil THA,Bil TSA.  Clinical Impression  Pt is s/p TKA resulting in the deficits listed below (see PT Problem List). At baseline, pt active and independent.  He has been limited recently due to knee pain.  Pt has home support and wife is checking to see if they still have RW.  Today, pt still with effects of nerve block and no active quad contraction.  Utilized KI , but pt still unable to compensate so returned to bed after attempting 1-2 steps.  Expect pt to progress well (has been through bil THA, bil tsa, and R TKA) once quad activation recovers.   Pt will benefit from acute skilled PT to increase their independence and safety with mobility to allow discharge.          If plan is discharge home, recommend the following: A lot of help with walking and/or transfers;A little help with bathing/dressing/bathroom;Assistance with cooking/housework;Help with stairs or ramp for entrance   Can travel by private vehicle        Equipment Recommendations Rolling walker (2 wheels);Other (comment) (Pt believes they have one at home but wife is checking tonight)  Recommendations for Other Services       Functional Status Assessment Patient has had a recent decline in their functional status and demonstrates the ability to make significant improvements in function in a reasonable and predictable amount of time.     Precautions / Restrictions Precautions Precautions: Fall;Knee Required Braces or Orthoses: Knee Immobilizer - Left Knee Immobilizer - Left: Discontinue once straight leg raise with < 10 degree lag Restrictions Weight Bearing Restrictions Per Provider Order: Yes LLE Weight Bearing Per  Provider Order: Weight bearing as tolerated      Mobility  Bed Mobility Overal bed mobility: Needs Assistance Bed Mobility: Supine to Sit, Sit to Supine     Supine to sit: Min assist Sit to supine: Min assist        Transfers Overall transfer level: Needs assistance Equipment used: Rolling walker (2 wheels) Transfers: Sit to/from Stand Sit to Stand: Min assist           General transfer comment: Min A to steady, KI in place, cues to use UE and R LE    Ambulation/Gait Ambulation/Gait assistance: Mod assist   Assistive device: Rolling walker (2 wheels)         General Gait Details: Pt attempted to take a couple steps to chair with KI in place but difficulty compensating with UE and L knee buckling so returned to sitting  Stairs            Wheelchair Mobility     Tilt Bed    Modified Rankin (Stroke Patients Only)       Balance Overall balance assessment: Needs assistance Sitting-balance support: No upper extremity supported Sitting balance-Leahy Scale: Good     Standing balance support: Bilateral upper extremity supported, Reliant on assistive device for balance Standing balance-Leahy Scale: Poor                               Pertinent Vitals/Pain Pain Assessment Pain Assessment: 0-10 Pain Score: 4  Pain Location: bottom  and L knee Pain Descriptors / Indicators: Discomfort Pain Intervention(s): Limited activity within patient's tolerance, Monitored during session, Premedicated before session, Repositioned, Patient requesting pain meds-RN notified    Home Living Family/patient expects to be discharged to:: Private residence Living Arrangements: Spouse/significant other Available Help at Discharge: Family;Available 24 hours/day Type of Home: House Home Access: Stairs to enter Entrance Stairs-Rails: None (no rail but filing cabinet on R side that they can hold onto) Entrance Stairs-Number of Steps: 2-3 Alternate Level  Stairs-Number of Steps: 15 Home Layout: Two level;Able to live on main level with bedroom/bathroom;Full bath on main level Home Equipment: Cane - single point (Reports believe they still have RW but wife will check tonight) Additional Comments: Enjoys walking on land ([place] acres) , working in work shop, target shooting, yard work, Contractor Prior Level of Function : Independent/Modified Independent             Mobility Comments: Could ambulate without AD but did Dispensing optician recently b/c of knee ADLs Comments: independent     Extremity/Trunk Assessment   Upper Extremity Assessment Upper Extremity Assessment: RUE deficits/detail;LUE deficits/detail RUE Deficits / Details: Bil TSA, limited shoulder ROM but otherwise ROM WFL; MMT 5/5 throughout LUE Deficits / Details: Bil TSA, limited shoulder ROM but otherwise ROM WFL; MMT 5/5 throughout    Lower Extremity Assessment Lower Extremity Assessment: LLE deficits/detail;RLE deficits/detail RLE Deficits / Details: ROM WFL; MMT 5/5 LLE Deficits / Details: Expected post op changes ; ROM : knee 5 to 80 degrees; MMT: ankle 5/5, knee ext/quad contraction 0/5; hip 3/5; Pt had adductor canal block - motor control effected LLE Sensation: decreased light touch (L thigh)    Cervical / Trunk Assessment Cervical / Trunk Assessment: Normal  Communication        Cognition Arousal: Alert Behavior During Therapy: WFL for tasks assessed/performed   PT - Cognitive impairments: No apparent impairments                                 Cueing       General Comments General comments (skin integrity, edema, etc.): Educated on resting wtih leg straight    Exercises     Assessment/Plan    PT Assessment Patient needs continued PT services  PT Problem List Decreased strength;Pain;Decreased range of motion;Decreased activity tolerance;Decreased knowledge of use of DME;Decreased balance;Decreased mobility        PT Treatment Interventions DME instruction;Therapeutic exercise;Gait training;Balance training;Stair training;Functional mobility training;Therapeutic activities;Patient/family education;Modalities    PT Goals (Current goals can be found in the Care Plan section)  Acute Rehab PT Goals Patient Stated Goal: return home PT Goal Formulation: With patient/family Time For Goal Achievement: 11/21/23 Potential to Achieve Goals: Good    Frequency 7X/week     Co-evaluation               AM-PAC PT 6 Clicks Mobility  Outcome Measure Help needed turning from your back to your side while in a flat bed without using bedrails?: A Little Help needed moving from lying on your back to sitting on the side of a flat bed without using bedrails?: A Little Help needed moving to and from a bed to a chair (including a wheelchair)?: A Lot Help needed standing up from a chair using your arms (e.g., wheelchair or bedside chair)?: A Lot Help needed to walk in hospital room?: Total Help needed climbing 3-5 steps with  a railing? : Total 6 Click Score: 12    End of Session Equipment Utilized During Treatment: Gait belt;Left knee immobilizer Activity Tolerance: Treatment limited secondary to medical complications (Comment) Patient left: in bed;with call bell/phone within reach;with SCD's reapplied;with bed alarm set Nurse Communication: Mobility status;Patient requests pain meds (Adviced no OOB transfers until quad activation improves as pt not compensating well) PT Visit Diagnosis: Other abnormalities of gait and mobility (R26.89);Muscle weakness (generalized) (M62.81)    Time: 8298-8267 PT Time Calculation (min) (ACUTE ONLY): 31 min   Charges:   PT Evaluation $PT Eval Low Complexity: 1 Low PT Treatments $Therapeutic Activity: 8-22 mins PT General Charges $$ ACUTE PT VISIT: 1 Visit         Benjiman, PT Acute Rehab [facility] [phone]   Benjiman VEAR Mulberry 11/07/2023, 5:49  PM

## 2023-11-07 NOTE — Care Plan (Signed)
 Ortho Bundle Case Management Note  Patient Details  Name: Jason Friedman. MRN: [id number] Date of Birth: [birth year]                  LT TKA on 11/07/23  DCP: Home with wife  DME: No needs, has RW  PT: HEP (Did HEP w/ RT TKA)   DME Arranged:  N/A DME Agency:       Additional Comments: Please contact me with any questions of if this plan should need to change.    Burnard Dross, Case Manager [facility]  [phone] 11/07/2023, 9:38 AM

## 2023-11-08 ENCOUNTER — Other Ambulatory Visit (HOSPITAL_COMMUNITY): Payer: Self-pay

## 2023-11-08 ENCOUNTER — Encounter (HOSPITAL_COMMUNITY): Payer: Self-pay | Admitting: Orthopedic Surgery

## 2023-11-08 DIAGNOSIS — M1712 Unilateral primary osteoarthritis, left knee: Secondary | ICD-10-CM | POA: Diagnosis not present

## 2023-11-08 DIAGNOSIS — Z96611 Presence of right artificial shoulder joint: Secondary | ICD-10-CM | POA: Diagnosis not present

## 2023-11-08 DIAGNOSIS — Z79899 Other long term (current) drug therapy: Secondary | ICD-10-CM | POA: Diagnosis not present

## 2023-11-08 DIAGNOSIS — Z96612 Presence of left artificial shoulder joint: Secondary | ICD-10-CM | POA: Diagnosis not present

## 2023-11-08 DIAGNOSIS — Z96643 Presence of artificial hip joint, bilateral: Secondary | ICD-10-CM | POA: Diagnosis not present

## 2023-11-08 DIAGNOSIS — I10 Essential (primary) hypertension: Secondary | ICD-10-CM | POA: Diagnosis not present

## 2023-11-08 DIAGNOSIS — E039 Hypothyroidism, unspecified: Secondary | ICD-10-CM | POA: Diagnosis not present

## 2023-11-08 DIAGNOSIS — J449 Chronic obstructive pulmonary disease, unspecified: Secondary | ICD-10-CM | POA: Diagnosis not present

## 2023-11-08 DIAGNOSIS — Z96651 Presence of right artificial knee joint: Secondary | ICD-10-CM | POA: Diagnosis not present

## 2023-11-08 LAB — CBC
HCT: 31.3 % — ABNORMAL LOW (ref 39.0–52.0)
Hemoglobin: 10.2 g/dL — ABNORMAL LOW (ref 13.0–17.0)
MCH: 34.9 pg — ABNORMAL HIGH (ref 26.0–34.0)
MCHC: 32.6 g/dL (ref 30.0–36.0)
MCV: 107.2 fL — ABNORMAL HIGH (ref 80.0–100.0)
Platelets: 264 K/uL (ref 150–400)
RBC: 2.92 MIL/uL — ABNORMAL LOW (ref 4.22–5.81)
RDW: 12.5 % (ref 11.5–15.5)
WBC: 11.5 K/uL — ABNORMAL HIGH (ref 4.0–10.5)
nRBC: 0 % (ref 0.0–0.2)

## 2023-11-08 LAB — BASIC METABOLIC PANEL WITH GFR
Anion gap: 7 (ref 5–15)
BUN: 26 mg/dL — ABNORMAL HIGH (ref 8–23)
CO2: 26 mmol/L (ref 22–32)
Calcium: 8.5 mg/dL — ABNORMAL LOW (ref 8.9–10.3)
Chloride: 101 mmol/L (ref 98–111)
Creatinine, Ser: 1.07 mg/dL (ref 0.61–1.24)
GFR, Estimated: >60 mL/min (ref >60)
Glucose, Bld: 272 mg/dL — ABNORMAL HIGH (ref 70–99)
Potassium: 5.1 mmol/L (ref 3.5–5.1)
Sodium: 134 mmol/L — ABNORMAL LOW (ref 135–145)

## 2023-11-08 MED ORDER — METHOCARBAMOL 500 MG PO TABS
500.0000 mg | ORAL_TABLET | Freq: Four times a day (QID) | ORAL | 0 refills | Status: AC | PRN
Start: 2023-11-08 — End: ?
  Filled 2023-11-08: qty 40, 10d supply, fill #0

## 2023-11-08 MED ORDER — ASPIRIN 81 MG PO CHEW
81.0000 mg | CHEWABLE_TABLET | Freq: Two times a day (BID) | ORAL | 0 refills | Status: AC
  Filled 2023-11-08: qty 63, 32d supply, fill #0

## 2023-11-08 MED ORDER — TRAMADOL HCL 50 MG PO TABS
50.0000 mg | ORAL_TABLET | Freq: Four times a day (QID) | ORAL | 0 refills | Status: AC | PRN
  Filled 2023-11-08: qty 30, 4d supply, fill #0

## 2023-11-08 MED ORDER — OXYCODONE HCL 5 MG PO TABS
5.0000 mg | ORAL_TABLET | Freq: Four times a day (QID) | ORAL | 0 refills | Status: AC | PRN
  Filled 2023-11-08: qty 20, 5d supply, fill #0

## 2023-11-08 MED ORDER — ONDANSETRON HCL 4 MG PO TABS
4.0000 mg | ORAL_TABLET | Freq: Four times a day (QID) | ORAL | 0 refills | Status: AC | PRN
  Filled 2023-11-08: qty 20, 5d supply, fill #0

## 2023-11-08 NOTE — Care Management Obs Status (Signed)
 MEDICARE OBSERVATION STATUS NOTIFICATION   Patient Details  Name: Jason Friedman. MRN: [id number] Date of Birth: 09-09-1940   Medicare Observation Status Notification Given:  Yes    Lorren Rossetti A Brenon Antosh, LCSW 11/08/2023, 10:15 AM

## 2023-11-08 NOTE — TOC Transition Note (Signed)
 Transition of Care [facility]) - Discharge Note   Patient Details  Name: Jason Friedman. MRN: [id number] Date of Birth: [birth year]  Transition of Care [facility]) CM/SW Contact:  Heather DELENA Saltness, LCSW Phone Number: 11/08/2023, 12:10 PM   Clinical Narrative:    CSW met with pt and [name], at bedside to discuss discharge planning. Pt is discharging home with HEP. Pt reports he owns RW. Pt's wife to transport pt home. No further TOC needs at this time.   Final next level of care: Home/Self Care Barriers to Discharge: Barriers Resolved   Patient Goals and CMS Choice Patient states their goals for this hospitalization and ongoing recovery are:: To return home        Discharge Placement  Home                     Discharge Plan and Services Additional resources added to the After Visit Summary for post-op follow-up appointment                DME Arranged: N/A DME Agency: NA       HH Arranged: NA HH Agency: NA        Social Drivers of Health (SDOH) Interventions SDOH Screenings   Food Insecurity: No Food Insecurity (11/07/2023)  Housing: Low Risk  (11/07/2023)  Transportation Needs: No Transportation Needs (11/07/2023)  Utilities: Not At Risk (11/07/2023)  Social Connections: Socially Integrated (11/07/2023)  Tobacco Use: Low Risk  (11/07/2023)     Readmission Risk Interventions     No data to display          Heather Saltness, MSW, LCSW 11/08/2023 12:12 PM

## 2023-11-08 NOTE — Progress Notes (Signed)
 AVS reviewed w/ patient and wife- TOC meds in a secure bag delivered to pt's wife in room. No other questions at this time. Pt dressed for d/c to home. Pt to lobby via w/c - home w/ wife

## 2023-11-08 NOTE — Progress Notes (Signed)
 Subjective: 1 Day Post-Op Procedure(s) (LRB): ARTHROPLASTY, KNEE, TOTAL (Left) Patient reports pain as mild.   Patient seen in rounds by Dr. Melodi. Patient with history of dementia, had confusion this AM with controlled fall. No concerns regarding knee this morning based on pain level.  We will continue therapy today  Objective: Vital signs in last 24 hours: Temp:  [96.3 F (35.7 C)-98.2 F (36.8 C)] 98.2 F (36.8 C) (07/15 0526) Pulse Rate:  [51-81] 62 (07/15 0526) Resp:  [8-24] 18 (07/15 0526) BP: (98-162)/(53-118) 109/59 (07/15 0526) SpO2:  [91 %-100 %] 95 % (07/15 0526)  Intake/Output from previous day:  Intake/Output Summary (Last 24 hours) at 11/08/2023 0853 Last data filed at 11/08/2023 0600 Gross per 24 hour  Intake 3249.25 ml  Output 1275 ml  Net 1974.25 ml     Intake/Output this shift: No intake/output data recorded.  Labs: Recent Labs    11/08/23 0328  HGB 10.2*   Recent Labs    11/08/23 0328  WBC 11.5*  RBC 2.92*  HCT 31.3*  PLT 264   Recent Labs    11/08/23 0328  NA 134*  K 5.1  CL 101  CO2 26  BUN 26*  CREATININE 1.07  GLUCOSE 272*  CALCIUM 8.5*   No results for input(s): LABPT, INR in the last 72 hours.  Exam: General - Patient is Alert Extremity - Neurologically intact Neurovascular intact Sensation intact distally Dorsiflexion/Plantar flexion intact Dressing - dressing C/D/I Motor Function - intact, moving foot and toes well on exam.   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 , peripheri neuropathy   COPD (chronic obstructive pulmonary disease) (HCC)    mild copd per lov note dr theotis 04-16-11 on chart   Difficult intubation 1980s   difficult d/t anterior  larynx, successful intubation using Glidescope 04/07/17   Fracture of cervical vertebrae, multiple (HCC)  in high school   2 cervical vertebrae fx, 3 dislocated cervical vertebrae   Grade I diastolic dysfunction    noted on ECHO   Heart abnormality    heart anatomicall rotated, causes abnormal ekg   Hypertension    hx on, none current   Hypogonadism in male    Hypothyroidism    Impaired memory    x 2 yrs ago after neck injury   Insomnia    Paralysis, diaphragm    Left   PONV (postoperative nausea and vomiting)    likes scopolamine  patch, vertigo ,pt. reports that anesthesia has had problem finding my airway   Prostate irregularity    nonfuctional prostate, on testerone gel bid   Sigmoid diverticulitis    Sleep apnea    does not use due to weight loss, does not need now, not able to use CPAP, last sleep study- 2018    Assessment/Plan: 1 Day Post-Op Procedure(s) (LRB): ARTHROPLASTY, KNEE, TOTAL (Left) Principal Problem:   OA (osteoarthritis) of knee Active Problems:   Primary osteoarthritis of left knee  Estimated body mass index is 26.05 kg/m as calculated from the following:   Height as of this encounter: 5' 9 (1.753 m).   Weight as of this encounter: 80 kg. Advance diet Up with therapy D/C  IV fluids   DVT Prophylaxis - Aspirin  Weight bearing as tolerated. Continue therapy.  Plan is to go Home with HEP after hospital stay. Possible discharge later today if progresses with therapy and meeting goals. Confusion will improve if he is able to go home.  Follow-up in the office in 2 weeks.  The PDMP database was reviewed today prior to any opioid medications being prescribed to this patient.  Roxie Mess, PA-C Orthopedic Surgery [phone] 11/08/2023, 8:53 AM

## 2023-11-08 NOTE — Progress Notes (Signed)
 Physical Therapy Treatment Patient Details Name: Jason Friedman. MRN: [id number] DOB: 10-31-1940 Today's Date: 11/08/2023   History of Present Illness Patient is 83 yo male s/p L TKA on 11/07/23.  Pt with PMH including but not limited to OA, R TKA, HTN, COPD, multiple C-spine surgeries (fusions C4-6), Bil THA,Bil TSA.    PT Comments  Pt with gradual progress but still very weak quad contraction.  He did have some confusion earlier (per PA note hx of dementia) and had assisted fall early this morning, no injuries.  Pt more clear now but still needs frequent cues for transfers and safety.  He required mod A transfers and assist of 2 with gait for safety and blocking of L knee.  Noted per ortho note hopeful that confusion would improve if pt able to return home; however, at this time pt requiring significant assist and with L knee weakness and buckling.  Plan to f/u in afternoon but suspect pt will need further acute therapy prior to d/c home.     If plan is discharge home, recommend the following: A lot of help with walking and/or transfers;A little help with bathing/dressing/bathroom;Assistance with cooking/housework;Help with stairs or ramp for entrance   Can travel by private vehicle        Equipment Recommendations  None recommended by PT (wife confirmed 2 RW at home)    Recommendations for Other Services       Precautions / Restrictions Precautions Precautions: Fall;Knee Required Braces or Orthoses: Knee Immobilizer - Left Knee Immobilizer - Left: Discontinue once straight leg raise with < 10 degree lag Restrictions LLE Weight Bearing Per Provider Order: Weight bearing as tolerated     Mobility  Bed Mobility Overal bed mobility: Needs Assistance Bed Mobility: Supine to Sit     Supine to sit: Mod assist     General bed mobility comments: assist for L LE and lifting trunk    Transfers Overall transfer level: Needs assistance Equipment used: Rolling walker (2  wheels) Transfers: Sit to/from Stand Sit to Stand: Mod assist, +2 safety/equipment           General transfer comment: cues for hand placement; mod A x 2 to rise; performed x 3    Ambulation/Gait Ambulation/Gait assistance: Min assist, +2 physical assistance Gait Distance (Feet): 25 Feet Assistive device: Rolling walker (2 wheels) Gait Pattern/deviations: Step-to pattern, Decreased weight shift to left, Trunk flexed Gait velocity: decreased     General Gait Details: Started with weight shifting in standing.  Pt still with L knee instability and weakness, but able to bear some weight.  PT provided hands on blocking of knee for increased support and tech guarding with belt.  Pt tending to start to buckle the first several steps but therapist able to block knee , improved after 8' but therapist still blocking for safety.  Pt buckled in KI yesterday so provided hands on assist today.  Does have increased quad contraction today but still weak.   Stairs             Wheelchair Mobility     Tilt Bed    Modified Rankin (Stroke Patients Only)       Balance Overall balance assessment: Needs assistance Sitting-balance support: No upper extremity supported Sitting balance-Leahy Scale: Good     Standing balance support: Bilateral upper extremity supported, Reliant on assistive device for balance Standing balance-Leahy Scale: Poor Standing balance comment: RW and assist  Communication    Cognition Arousal: Alert Behavior During Therapy: WFL for tasks assessed/performed   PT - Cognitive impairments: History of cognitive impairments                       PT - Cognition Comments: Pt more confused earlier this morning (impulsive, unaware his surgery complete) but now oriented.  Still needs cues for safety.        Cueing    Exercises Total Joint Exercises Ankle Circles/Pumps: AROM, Both, 10 reps, Supine Quad Sets: AROM,  Both, 10 reps, Supine Long Arc Quad: AAROM, Left, 10 reps, Seated Knee Flexion: AAROM, Left, 10 reps, Seated Goniometric ROM: L knee 8 to 70 degrees    General Comments General comments (skin integrity, edema, etc.): Noted in PA note reports of assisted fall this morning.  Wife reports they called for assist but pt urgently needing to use urinal.  States she was with him and he sat EOB but then tried to stand and was unable and she helped lower him to floor - knee in good position.      Pertinent Vitals/Pain Pain Assessment Pain Assessment: Faces Faces Pain Scale: Hurts little more (Pt difficulty rating pain.  Was increased with transfers but once resting states tolerable.) Pain Location: L knee Pain Descriptors / Indicators: Discomfort Pain Intervention(s): Limited activity within patient's tolerance, Monitored during session, Premedicated before session, Repositioned, Ice applied    Home Living                          Prior Function            PT Goals (current goals can now be found in the care plan section) Progress towards PT goals: Progressing toward goals    Frequency    7X/week      PT Plan      Co-evaluation              AM-PAC PT 6 Clicks Mobility   Outcome Measure  Help needed turning from your back to your side while in a flat bed without using bedrails?: A Little Help needed moving from lying on your back to sitting on the side of a flat bed without using bedrails?: A Lot Help needed moving to and from a bed to a chair (including a wheelchair)?: A Lot Help needed standing up from a chair using your arms (e.g., wheelchair or bedside chair)?: A Lot Help needed to walk in hospital room?: A Lot Help needed climbing 3-5 steps with a railing? : Total 6 Click Score: 12    End of Session Equipment Utilized During Treatment: Gait belt Activity Tolerance: Patient tolerated treatment well Patient left: with call bell/phone within reach;with  chair alarm set;in chair;with family/visitor present (pt with increased pain with leg extended, tried pillow under calf but still increased, switched to pillow long way -did go under knee and calf but placed towel roll under distal pillow to assure knee straight, pt reports now tolerable) Nurse Communication: Mobility status;Patient requests pain meds PT Visit Diagnosis: Other abnormalities of gait and mobility (R26.89);Muscle weakness (generalized) (M62.81)     Time: 8881-8851 PT Time Calculation (min) (ACUTE ONLY): 30 min  Charges:    $Gait Training: 8-22 mins $Therapeutic Exercise: 8-22 mins PT General Charges $$ ACUTE PT VISIT: 1 Visit                     Darvis Croft, PT Acute  Rehab Services [facility] [phone]    Benjiman VEAR Mulberry 11/08/2023, 1:10 PM

## 2023-11-08 NOTE — Progress Notes (Signed)
 Physical Therapy Treatment Patient Details Name: Jason Friedman. MRN: [id number] DOB: [birth year] Today's Date: 11/08/2023   History of Present Illness Patient is 83 yo male s/p L TKA on 11/07/23.  Pt with PMH including but not limited to OA, R TKA, HTN, COPD, multiple C-spine surgeries (fusions C4-6), Bil THA,Bil TSA.    PT Comments  Pt is POD # 1 and is progressing well. Pt with improved quad activation this afternoon and eager to return home.  Pt is impulsive at times and decreased safety awareness - however baseline, wife present and is able to provide assist and cues.  Wife comfortable with returning home with pt.  He demonstrated improved quad activation, increased ambulation to 150' without blocking, and performed steps similar to home set up. Pt demonstrates safe gait & transfers in order to return home from PT perspective once discharged by MD.  While in hospital, will continue to benefit from PT for skilled therapy to advance mobility and exercises.       If plan is discharge home, recommend the following: A little help with bathing/dressing/bathroom;Assistance with cooking/housework;Help with stairs or ramp for entrance;A little help with walking and/or transfers   Can travel by private vehicle        Equipment Recommendations  None recommended by PT    Recommendations for Other Services       Precautions / Restrictions Precautions Precautions: Fall;Knee Restrictions LLE Weight Bearing Per Provider Order: Weight bearing as tolerated     Mobility  Bed Mobility Overal bed mobility: Needs Assistance Bed Mobility: Supine to Sit, Sit to Supine     Supine to sit: Supervision Sit to supine: Supervision   General bed mobility comments: assist for L LE and lifting trunk    Transfers Overall transfer level: Needs assistance Equipment used: Rolling walker (2 wheels) Transfers: Sit to/from Stand Sit to Stand: Contact guard assist           General transfer  comment: Performed x 5 during session.  CGA for safety.  Cues for controlled descent.  Wife able to provide CGA    Ambulation/Gait Ambulation/Gait assistance: Contact guard assist Gait Distance (Feet): 125 Feet Assistive device: Rolling walker (2 wheels) Gait Pattern/deviations: Step-to pattern, Decreased weight shift to left Gait velocity: decreased     General Gait Details: Good RW proximity, improved knee stability , did not need blocking.  Does need cues for safety and taking his time.   Stairs Stairs: Yes Stairs assistance: Min assist     General stair comments: Pt impulisvely starting for stairs prior to PT being able to give cues with large buckle on first step requiring mod A and heavy use of rails to recover.  Able to get pt to stop and provide cues.  Pt then able to complete 3 steps with bil rails and CGA but heavily dependent on rails.  Pt does not have bil rails - only has file cabinet on R.  Opted to try backward with RW.  Provided instruction with pt sitting.  Wife present and able to provide cues.  Pt able to go up backward with RW , min A to stabilize RW, CGA for balance; also down with RW.  Wife able to provide RW stabilization and tech guarding from behind.  Provided stair handout.  Recommended assist of 2 for safety in same manner performed in gym.   Wheelchair Mobility     Tilt Bed    Modified Rankin (Stroke Patients Only)  Balance Overall balance assessment: Needs assistance Sitting-balance support: No upper extremity supported Sitting balance-Leahy Scale: Good     Standing balance support: Bilateral upper extremity supported, Reliant on assistive device for balance, No upper extremity supported Standing balance-Leahy Scale: Fair Standing balance comment: RW and CGA for mobility, Was able to pull up underwear in standing without UE support                            Communication    Cognition Arousal: Alert Behavior During Therapy:  Impulsive   PT - Cognitive impairments: History of cognitive impairments                       PT - Cognition Comments: Pt more alert and eager to return home (this mornign had agreed knee was weak and may need more time).  Impulsive at times needing max cues for safety - wife able to provide cues.  Pt does have hx of dementia        Cueing    Exercises Total Joint Exercises Ankle Circles/Pumps: AROM, Both, 10 reps, Supine Quad Sets: AROM, Both, 10 reps, Supine Heel Slides: AAROM, Left, 10 reps, Supine Hip ABduction/ADduction: AAROM, Left, 10 reps, Supine Long Arc Quad: Left, 10 reps, Seated, AROM, Limitations Long Arc Quad Limitations: Pt with improved quad activation and can perform LAQ on his own Knee Flexion: AAROM, Left, 10 reps, Seated Goniometric ROM: L knee 8 to 80 degrees    General Comments General comments (skin integrity, edema, etc.): Wife present throughout session and is able to provide assist and cues.  She reports comfortable returning home with pt with his good progress this afternoon.  Educated on safe ice use, no pivots, car transfers, resting with leg straight, and TED hose during day. Also, encouraged walking every 1-2 hours during day. Educated on HEP with focus on mobility the first weeks. Discussed doing exercises within pain control and if pain increasing could decreased ROM, reps, and stop exercises as needed. Encouraged to perform quad sets and ankle pumps frequently for blood flow and to promote full knee extension.  Increased time on education, provided written handouts.  Pt and wife report initially no HHPT or outpt PT as pt does not like and wants to do HEP for 2 weeks then see if still needs therapy.  Educated on importance of exercises and progress as able.  Does have decreased terminal ext - encouraged low load long duration (5-10 mins) stretch on towel roll/pillow/stool.        Pertinent Vitals/Pain Pain Assessment Pain Assessment:  0-10 Faces Pain Scale: Hurts little more Pain Location: L knee Pain Descriptors / Indicators: Discomfort Pain Intervention(s): Limited activity within patient's tolerance, Monitored during session, Premedicated before session, Repositioned, Ice applied    Home Living                          Prior Function            PT Goals (current goals can now be found in the care plan section) Progress towards PT goals: Progressing toward goals    Frequency    7X/week      PT Plan      Co-evaluation              AM-PAC PT 6 Clicks Mobility   Outcome Measure  Help needed turning from your back to your side while  in a flat bed without using bedrails?: A Little Help needed moving from lying on your back to sitting on the side of a flat bed without using bedrails?: A Little Help needed moving to and from a bed to a chair (including a wheelchair)?: A Little Help needed standing up from a chair using your arms (e.g., wheelchair or bedside chair)?: A Little Help needed to walk in hospital room?: A Little Help needed climbing 3-5 steps with a railing? : A Little 6 Click Score: 18    End of Session Equipment Utilized During Treatment: Gait belt Activity Tolerance: Patient tolerated treatment well Patient left: with call bell/phone within reach;with chair alarm set;in chair;with family/visitor present Nurse Communication: Mobility status PT Visit Diagnosis: Other abnormalities of gait and mobility (R26.89);Muscle weakness (generalized) (M62.81)     Time: 8484-8392 PT Time Calculation (min) (ACUTE ONLY): 52 min  Charges:    $Gait Training: 23-37 mins $Therapeutic Exercise: 8-22 mins PT General Charges $$ ACUTE PT VISIT: 1 Visit                     Benjiman, PT Acute Rehab [facility] [phone]    Benjiman VEAR Mulberry 11/08/2023, 5:44 PM

## 2023-11-08 NOTE — Progress Notes (Signed)
 Patient has been cleared by PT for discharge. AVS has been given to patient and wife

## 2023-11-22 DIAGNOSIS — Z5189 Encounter for other specified aftercare: Secondary | ICD-10-CM | POA: Diagnosis not present

## 2023-11-22 NOTE — Discharge Summary (Signed)
 Patient ID: Jason Friedman. MRN: [id number] DOB/AGE: 01-Sep-1940 83 y.o.  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 , peripheri neuropathy   COPD (chronic obstructive pulmonary disease) (HCC)    mild copd per lov note dr theotis 04-16-11 on chart   Difficult intubation 1980s   difficult d/t anterior larynx, successful intubation using Glidescope 04/07/17   Fracture of cervical vertebrae, multiple (HCC)  in high school   2 cervical vertebrae fx, 3 dislocated cervical vertebrae   Grade I diastolic dysfunction    noted on ECHO   Heart abnormality    heart anatomicall rotated, causes abnormal ekg   Hypertension    hx on, none current   Hypogonadism in male    Hypothyroidism    Impaired memory    x 2 yrs ago after neck injury   Insomnia    Paralysis, diaphragm    Left   PONV (postoperative nausea and vomiting)    likes scopolamine  patch, vertigo ,pt. reports that anesthesia has had problem finding my airway   Prostate irregularity    nonfuctional prostate, on testerone gel bid   Sigmoid diverticulitis    Sleep apnea    does not use due to weight loss, does not need now, not able to use CPAP, last sleep study- 2018    Surgeries: Procedure(s): ARTHROPLASTY, KNEE, TOTAL on 11/07/2023   Consultants:   Discharged Condition: Improved  Hospital Course: Jason Friedman. is an 83 y.o. male who was admitted 11/07/2023 for operative treatment ofOA (osteoarthritis) of knee. Patient has severe unremitting pain that  affects sleep, daily activities, and work/hobbies. After pre-op clearance the patient was taken to the operating room on 11/07/2023 and underwent  Procedure(s): ARTHROPLASTY, KNEE, TOTAL.    Patient was given perioperative antibiotics:  Anti-infectives (From admission, onward)    Start     Dose/Rate Route Frequency Ordered Stop   11/07/23 1600  ceFAZolin  (ANCEF ) IVPB 2g/100 mL premix        2 g 200 mL/hr over 30 Minutes Intravenous Every 6 hours 11/07/23 1505 11/07/23 2214   11/07/23 0845  ceFAZolin  (ANCEF ) IVPB 2g/100 mL premix        2 g 200 mL/hr over 30 Minutes Intravenous On call to O.R. 11/07/23 0830 11/07/23 1043        Patient was given sequential compression devices, early ambulation, and chemoprophylaxis to prevent DVT.  Patient benefited maximally from hospital stay and there were no complications.    Recent vital signs: No data found.   Recent laboratory studies: No results for input(s): WBC, HGB, HCT, PLT, NA, K, CL, CO2, BUN, CREATININE, GLUCOSE, INR, CALCIUM in the last 72 hours.  Invalid input(s): PT, 2   Discharge Medications:   Allergies as of 11/08/2023       Reactions   Duloxetine Other (See Comments)   Altered personality - didn't eat, pain all over   Cefuroxime Axetil [ceftin] Other (See Comments)   Joint pain   Shellfish  Allergy Swelling   SWELLING REACTION UNSPECIFIED    Sulfa Antibiotics Other (See Comments)   UNSPECIFIED REACTION    Latex Other (See Comments)   Skin irritation         Medication List     STOP taking these medications    Brinzolamide -Brimonidine  1-0.2 % Susp   carboxymethylcellulose 0.5 % Soln Commonly known as: REFRESH PLUS   cromolyn  4 % ophthalmic solution Commonly known as: OPTICROM    dorzolamide -timolol  2-0.5 % ophthalmic solution Commonly known as: COSOPT        TAKE these medications    AndroGel  Pump 20.25 MG/ACT (1.62%) Gel Generic drug: Testosterone  Apply 2.5 Pump  topically 2 (two) times daily.   Aspirin  Low Dose 81 MG chewable tablet Generic drug: aspirin  Chew 1 tablet (81 mg total) by mouth 2 (two) times daily for 21 days. Then take one 81 mg aspirin  once a day for three weeks. Then discontinue aspirin .   bisacodyl  5 MG EC tablet Generic drug: bisacodyl  Take 5 mg by mouth at bedtime.   donepezil  5 MG tablet Commonly known as: ARICEPT  Take 5 mg by mouth at bedtime.   doxycycline 100 MG capsule Commonly known as: VIBRAMYCIN Take 100 mg by mouth 2 (two) times daily as needed (for tick bites). Take for 7 days   fluticasone  50 MCG/ACT nasal spray Commonly known as: FLONASE  Place 2 sprays into both nostrils at bedtime as needed for allergies.   gabapentin  600 MG tablet Commonly known as: NEURONTIN  Take 600 mg by mouth daily.   gabapentin  400 MG capsule Commonly known as: NEURONTIN  Take 400 mg by mouth 2 (two) times daily.   HYDROcodone-acetaminophen  10-325 MG tablet Commonly known as: NORCO Take 1 tablet by mouth 3 (three) times daily.   levothyroxine  175 MCG tablet Commonly known as: SYNTHROID  Take 175 mcg by mouth every other day.   levothyroxine  150 MCG tablet Commonly known as: SYNTHROID  Take 150 mcg by mouth every other day.   losartan  50 MG tablet Commonly known as: COZAAR  Take 50 mg by mouth every evening.   memantine  5 MG tablet Commonly known as: NAMENDA  Take 5 mg by mouth 2 (two) times daily.   methocarbamol  500 MG tablet Commonly known as: ROBAXIN  Take 1 tablet (500 mg total) by mouth every 6 (six) hours as needed for muscle spasms (during the day).   multivitamin with minerals tablet Take 1 tablet by mouth daily.   ondansetron  4 MG tablet Commonly known as: ZOFRAN  Take 1 tablet (4 mg total) by mouth every 6 (six) hours as needed for nausea.   oxyCODONE  5 MG immediate release tablet Commonly known as: Oxy IR/ROXICODONE  Take 1 tablet (5 mg total) by mouth every 6 (six) hours as needed for breakthrough pain.  **Do not take with hydrocodone/acetaminophen **   tiZANidine  4 MG tablet Commonly known as: ZANAFLEX  Take 4 mg by mouth See admin instructions. Take 4mg  (1 tablet) by mouth one to two times a day.   traMADol  50 MG tablet Commonly known as: ULTRAM  Take 1-2 tablets (50-100 mg total) by mouth every 6 (six) hours as needed for moderate pain (pain score 4-6). **Do not take with hydrocodone/acetaminophen **               Discharge Care Instructions  (From admission, onward)           Start     Ordered   11/08/23 0000  Weight bearing as tolerated        11/08/23 0857   11/08/23 0000  Change dressing       Comments: You have an adhesive waterproof bandage over the incision. Leave this in place until your first follow-up appointment. Once you remove this you will not need to place another bandage.   11/08/23 0857            Diagnostic Studies: No results found.  Disposition: Discharge disposition: 01-Home or Self Care       Discharge Instructions     Call MD / Call 911   Complete by: As directed    If you experience chest pain or shortness of breath, CALL 911 and be transported to the hospital emergency room.  If you develope a fever above 101 F, pus (white drainage) or increased drainage or redness at the wound, or calf pain, call your surgeon's office.   Change dressing   Complete by: As directed    You have an adhesive waterproof bandage over the incision. Leave this in place until your first follow-up appointment. Once you remove this you will not need to place another bandage.   Constipation Prevention   Complete by: As directed    Drink plenty of fluids.  Prune juice may be helpful.  You may use a stool softener, such as Colace (over the counter) 100 mg twice a day.  Use MiraLax  (over the counter) for constipation as needed.   Diet - low sodium heart healthy   Complete by: As directed    Do not sit on low chairs, stoools or toilet seats, as it may be difficult  to get up from low surfaces   Complete by: As directed    Driving restrictions   Complete by: As directed    No driving for two weeks   Post-operative opioid taper instructions:   Complete by: As directed    POST-OPERATIVE OPIOID TAPER INSTRUCTIONS: It is important to wean off of your opioid medication as soon as possible. If you do not need pain medication after your surgery it is ok to stop day one. Opioids include: Codeine, Hydrocodone(Norco, Vicodin), Oxycodone (Percocet, oxycontin ) and hydromorphone  amongst others.  Long term and even short term use of opiods can cause: Increased pain response Dependence Constipation Depression Respiratory depression And more.  Withdrawal symptoms can include Flu like symptoms Nausea, vomiting And more Techniques to manage these symptoms Hydrate well Eat regular healthy meals Stay active Use relaxation techniques(deep breathing, meditating, yoga) Do Not substitute Alcohol  to help with tapering If you have been on opioids for less than two weeks and do not have pain than it is ok to stop all together.  Plan to wean off of opioids This plan should start within one week post op of your joint replacement. Maintain the same interval or time between taking each dose and first decrease the dose.  Cut the total daily intake of opioids by one tablet each day Next start to increase the time between doses. The last dose that should be eliminated is the evening dose.      TED hose   Complete by: As directed    Use stockings (TED hose) for three weeks on both leg(s).  You may remove them at night for sleeping.   Weight bearing as tolerated   Complete by: As directed         Follow-up Information     Melodi Lerner, MD. Go on 11/22/2023.   Specialty: Orthopedic Surgery Why: You are scheduled for first postop appt on Tuesday July 29 at 2:15pm. Contact information: [address]  [place] [address] [place] [address] [phone]                   Signed: Roxie Mess 11/22/2023, 8:30 AM

## 2023-12-06 DIAGNOSIS — Z5189 Encounter for other specified aftercare: Secondary | ICD-10-CM | POA: Diagnosis not present

## 2023-12-13 DIAGNOSIS — Z5189 Encounter for other specified aftercare: Secondary | ICD-10-CM | POA: Diagnosis not present

## 2023-12-23 DIAGNOSIS — Z23 Encounter for immunization: Secondary | ICD-10-CM | POA: Diagnosis not present

## 2023-12-23 DIAGNOSIS — G894 Chronic pain syndrome: Secondary | ICD-10-CM | POA: Diagnosis not present

## 2023-12-23 DIAGNOSIS — Z0000 Encounter for general adult medical examination without abnormal findings: Secondary | ICD-10-CM | POA: Diagnosis not present

## 2023-12-23 DIAGNOSIS — G4733 Obstructive sleep apnea (adult) (pediatric): Secondary | ICD-10-CM | POA: Diagnosis not present

## 2023-12-23 DIAGNOSIS — Z1331 Encounter for screening for depression: Secondary | ICD-10-CM | POA: Diagnosis not present

## 2023-12-23 DIAGNOSIS — R413 Other amnesia: Secondary | ICD-10-CM | POA: Diagnosis not present

## 2023-12-23 DIAGNOSIS — Z79899 Other long term (current) drug therapy: Secondary | ICD-10-CM | POA: Diagnosis not present

## 2023-12-23 DIAGNOSIS — I10 Essential (primary) hypertension: Secondary | ICD-10-CM | POA: Diagnosis not present

## 2023-12-23 DIAGNOSIS — E291 Testicular hypofunction: Secondary | ICD-10-CM | POA: Diagnosis not present

## 2023-12-23 DIAGNOSIS — E538 Deficiency of other specified B group vitamins: Secondary | ICD-10-CM | POA: Diagnosis not present

## 2023-12-23 DIAGNOSIS — F1190 Opioid use, unspecified, uncomplicated: Secondary | ICD-10-CM | POA: Diagnosis not present

## 2023-12-23 DIAGNOSIS — E559 Vitamin D deficiency, unspecified: Secondary | ICD-10-CM | POA: Diagnosis not present

## 2023-12-23 DIAGNOSIS — E039 Hypothyroidism, unspecified: Secondary | ICD-10-CM | POA: Diagnosis not present

## 2023-12-23 DIAGNOSIS — D649 Anemia, unspecified: Secondary | ICD-10-CM | POA: Diagnosis not present

## 2024-01-04 ENCOUNTER — Encounter: Payer: Self-pay | Admitting: Neurology

## 2024-01-04 ENCOUNTER — Ambulatory Visit: Admitting: Neurology

## 2024-01-04 VITALS — BP 131/73 | HR 62 | Ht 69.0 in | Wt 176.0 lb

## 2024-01-04 DIAGNOSIS — Z9189 Other specified personal risk factors, not elsewhere classified: Secondary | ICD-10-CM | POA: Diagnosis not present

## 2024-01-04 DIAGNOSIS — Z789 Other specified health status: Secondary | ICD-10-CM

## 2024-01-04 DIAGNOSIS — Z79899 Other long term (current) drug therapy: Secondary | ICD-10-CM

## 2024-01-04 DIAGNOSIS — R413 Other amnesia: Secondary | ICD-10-CM | POA: Diagnosis not present

## 2024-01-04 MED ORDER — DONEPEZIL HCL 10 MG PO TABS: 10.0000 mg | ORAL_TABLET | Freq: Every day | ORAL | 5 refills | Status: AC

## 2024-01-04 NOTE — Patient Instructions (Addendum)
 It was nice to see you today.   I recommend that you continue with your memory medications for now.  I will increase the donepezil  to 10 mg once daily.  Monitor for side effects, which may include dry eyes, dry mouth, confusion, low pulse, low blood pressure, also GI related side effects (nausea, vomiting, diarrhea, constipation), headaches; rare side effects may include hallucinations and seizures.  I am concerned that your medications are contributing to your memory issues.  I do recommend that you talk to your prescriber about potentially reducing and coming off of some of the high risk medications that could affect your memory adversely.  These medications include: hydrocodone, Robaxin , Zanaflex , gabapentin , and tramadol .  We will proceed with a brain MRI without contrast and update you with the results after they come in. Please increase your water  intake to 6 to 8 cups of water  per day, 8 ounce size each. Follow-up with orthopedics regarding your left knee pain and possible additional options for pain control, such as topical creams, taking fluid off of the knee again. I recommend that you abstain from drinking alcohol  altogether.  Alcohol  does not combine well with any of your above named medications. I also recommend that you use a cane for walking. Please follow-up in this clinic in about 6 to 8 months to see one of our nurse practitioners.

## 2024-01-04 NOTE — Progress Notes (Signed)
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 surgeries including left shoulder arthroplasty, left carpal tunnel surgery, left total hip replacement, right shoulder reverse arthroplasty, bilateral total knee replacements, tonsillectomy and ACDF, who presents for evaluation of his memory loss of several years duration, currently on donepezil  5 mg daily and memantine  5 mg twice daily.  I had a long discussion with the patient and his wife today.  I think his memory loss is multifactorial in nature.  He has vascular risk factors.  He has no telltale family history of dementia.  He is advised to increase his donepezil  but work with his providers to reduce his polypharmacy and reduce the amount of medications that could adversely affect his memory.  This was an extended visit of over 70 minutes with copious record review involved and considerable counseling and coordination of care.   He has had recent blood work and this was reviewed.  Below is a summary of my recommendations and our discussion points from today's visit, based on chart review, history and examination. They were given these  instructions verbally during the visit in detail and also in writing in the MyChart after visit summary (AVS), which they can access electronically. <<   I recommend that you continue with your memory medications for now.  I will increase the donepezil  to 10 mg once daily.  Monitor for side effects, which may include dry eyes, dry mouth, confusion, low pulse, low blood pressure, also GI related side effects (nausea, vomiting, diarrhea, constipation), headaches; rare side effects may include hallucinations and seizures.  I am concerned that your medications are contributing to your memory issues.  I do recommend that you talk to your prescriber about potentially reducing and coming off of some of the high risk medications that could affect your memory adversely.  These medications include: hydrocodone, Robaxin , Zanaflex , gabapentin , and tramadol .  We will proceed with a brain MRI without contrast and update you with the results after they come in. Please increase your water  intake to 6 to 8 cups of water  per day, 8 ounce size each. Follow-up with orthopedics regarding your left knee pain and possible additional options for pain control, such as topical creams, taking fluid off of the knee again. I recommend that you abstain from drinking alcohol  altogether.  Alcohol  does not combine well with any of your above named medications. I also recommend that you use a cane for walking. Please follow-up in this clinic in about 6 to 8 months to see one of our nurse practitioners. >>    Thank you very much for allowing me to participate in the care of this nice patient. If I can be of any further assistance to you please do not hesitate to call me at [phone].   Sincerely,     True Mar, MD, PhD

## 2024-03-02 DIAGNOSIS — G629 Polyneuropathy, unspecified: Secondary | ICD-10-CM | POA: Diagnosis not present

## 2024-03-02 DIAGNOSIS — H409 Unspecified glaucoma: Secondary | ICD-10-CM | POA: Diagnosis not present

## 2024-03-02 DIAGNOSIS — Z809 Family history of malignant neoplasm, unspecified: Secondary | ICD-10-CM | POA: Diagnosis not present

## 2024-03-02 DIAGNOSIS — I10 Essential (primary) hypertension: Secondary | ICD-10-CM | POA: Diagnosis not present

## 2024-03-02 DIAGNOSIS — E039 Hypothyroidism, unspecified: Secondary | ICD-10-CM | POA: Diagnosis not present

## 2024-03-02 DIAGNOSIS — M62838 Other muscle spasm: Secondary | ICD-10-CM | POA: Diagnosis not present

## 2024-03-02 DIAGNOSIS — Z833 Family history of diabetes mellitus: Secondary | ICD-10-CM | POA: Diagnosis not present

## 2024-03-02 DIAGNOSIS — Z8249 Family history of ischemic heart disease and other diseases of the circulatory system: Secondary | ICD-10-CM | POA: Diagnosis not present

## 2024-03-02 DIAGNOSIS — G3184 Mild cognitive impairment, so stated: Secondary | ICD-10-CM | POA: Diagnosis not present

## 2024-08-20 ENCOUNTER — Ambulatory Visit: Admitting: Adult Health
